# Patient Record
Sex: Male | Born: 1965 | Race: White | Hispanic: No | Marital: Married | Smoking: Former smoker
Health system: Southern US, Community
[De-identification: ages and names within clinical notes are randomized; demographics above are authoritative.]

## PROBLEM LIST (undated history)

## (undated) DIAGNOSIS — K760 Fatty (change of) liver, not elsewhere classified: Secondary | ICD-10-CM

## (undated) DIAGNOSIS — R51 Headache: Secondary | ICD-10-CM

## (undated) DIAGNOSIS — IMO0002 Reserved for concepts with insufficient information to code with codable children: Secondary | ICD-10-CM

## (undated) DIAGNOSIS — M722 Plantar fascial fibromatosis: Secondary | ICD-10-CM

## (undated) DIAGNOSIS — I10 Essential (primary) hypertension: Secondary | ICD-10-CM

## (undated) DIAGNOSIS — K8020 Calculus of gallbladder without cholecystitis without obstruction: Secondary | ICD-10-CM

## (undated) DIAGNOSIS — T8859XA Other complications of anesthesia, initial encounter: Secondary | ICD-10-CM

## (undated) DIAGNOSIS — C679 Malignant neoplasm of bladder, unspecified: Secondary | ICD-10-CM

## (undated) DIAGNOSIS — R040 Epistaxis: Secondary | ICD-10-CM

## (undated) DIAGNOSIS — G5600 Carpal tunnel syndrome, unspecified upper limb: Secondary | ICD-10-CM

## (undated) DIAGNOSIS — E781 Pure hyperglyceridemia: Secondary | ICD-10-CM

## (undated) DIAGNOSIS — R42 Dizziness and giddiness: Secondary | ICD-10-CM

## (undated) DIAGNOSIS — Z8489 Family history of other specified conditions: Secondary | ICD-10-CM

## (undated) HISTORY — DX: Calculus of gallbladder without cholecystitis without obstruction: K80.20

## (undated) HISTORY — PX: HERNIA REPAIR: SHX51

## (undated) HISTORY — PX: TONSILLECTOMY: SUR1361

## (undated) HISTORY — DX: Fatty (change of) liver, not elsewhere classified: K76.0

## (undated) HISTORY — DX: Headache: R51

## (undated) HISTORY — DX: Dizziness and giddiness: R42

## (undated) HISTORY — DX: Malignant neoplasm of bladder, unspecified: C67.9

## (undated) HISTORY — DX: Plantar fascial fibromatosis: M72.2

## (undated) HISTORY — DX: Carpal tunnel syndrome, unspecified upper limb: G56.00

## (undated) HISTORY — DX: Reserved for concepts with insufficient information to code with codable children: IMO0002

## (undated) HISTORY — PX: LAMINECTOMY: SHX219

## (undated) HISTORY — DX: Essential (primary) hypertension: I10

## (undated) HISTORY — DX: Pure hyperglyceridemia: E78.1

## (undated) HISTORY — PX: KNEE ARTHROSCOPY: SUR90

## (undated) HISTORY — DX: Epistaxis: R04.0

---

## 2005-07-11 ENCOUNTER — Ambulatory Visit: Payer: Self-pay | Admitting: Family Medicine

## 2006-01-09 ENCOUNTER — Ambulatory Visit: Payer: Self-pay | Admitting: Family Medicine

## 2006-01-23 ENCOUNTER — Ambulatory Visit: Payer: Self-pay | Admitting: Family Medicine

## 2007-09-10 ENCOUNTER — Ambulatory Visit: Payer: Self-pay | Admitting: Family Medicine

## 2007-09-10 DIAGNOSIS — R42 Dizziness and giddiness: Secondary | ICD-10-CM | POA: Insufficient documentation

## 2007-09-13 LAB — CONVERTED CEMR LAB
AST: 34 units/L (ref 0–37)
Albumin: 4.1 g/dL (ref 3.5–5.2)
Alkaline Phosphatase: 48 units/L (ref 39–117)
BUN: 11 mg/dL (ref 6–23)
Basophils Absolute: 0.3 10*3/uL — ABNORMAL HIGH (ref 0.0–0.1)
Calcium: 9.6 mg/dL (ref 8.4–10.5)
Chloride: 103 meq/L (ref 96–112)
Eosinophils Absolute: 0.2 10*3/uL (ref 0.0–0.6)
GFR calc non Af Amer: 79 mL/min
MCHC: 36 g/dL (ref 30.0–36.0)
MCV: 86.5 fL (ref 78.0–100.0)
Monocytes Relative: 5.9 % (ref 3.0–11.0)
Neutrophils Relative %: 72.6 % (ref 43.0–77.0)
Platelets: 230 10*3/uL (ref 150–400)
RBC: 4.54 M/uL (ref 4.22–5.81)
RDW: 11.9 % (ref 11.5–14.6)
Sodium: 141 meq/L (ref 135–145)
TSH: 3.98 microintl units/mL (ref 0.35–5.50)

## 2007-09-23 ENCOUNTER — Encounter: Payer: Self-pay | Admitting: Family Medicine

## 2007-10-11 ENCOUNTER — Encounter: Payer: Self-pay | Admitting: Family Medicine

## 2007-10-11 DIAGNOSIS — R030 Elevated blood-pressure reading, without diagnosis of hypertension: Secondary | ICD-10-CM | POA: Insufficient documentation

## 2007-10-11 DIAGNOSIS — G5600 Carpal tunnel syndrome, unspecified upper limb: Secondary | ICD-10-CM | POA: Insufficient documentation

## 2010-04-05 ENCOUNTER — Ambulatory Visit: Payer: Self-pay | Admitting: Family Medicine

## 2010-04-05 DIAGNOSIS — M722 Plantar fascial fibromatosis: Secondary | ICD-10-CM | POA: Insufficient documentation

## 2010-06-08 ENCOUNTER — Ambulatory Visit: Payer: Self-pay | Admitting: Family Medicine

## 2010-06-08 LAB — CONVERTED CEMR LAB
Albumin: 4.5 g/dL (ref 3.5–5.2)
Basophils Relative: 0.8 % (ref 0.0–3.0)
CO2: 30 meq/L (ref 19–32)
Chloride: 103 meq/L (ref 96–112)
Cholesterol: 224 mg/dL — ABNORMAL HIGH (ref 0–200)
Creatinine, Ser: 1 mg/dL (ref 0.4–1.5)
Eosinophils Absolute: 0.2 10*3/uL (ref 0.0–0.7)
Glucose, Bld: 95 mg/dL (ref 70–99)
Hemoglobin: 14.1 g/dL (ref 13.0–17.0)
MCHC: 34.7 g/dL (ref 30.0–36.0)
MCV: 87.9 fL (ref 78.0–100.0)
Monocytes Absolute: 0.8 10*3/uL (ref 0.1–1.0)
Neutro Abs: 4 10*3/uL (ref 1.4–7.7)
Neutrophils Relative %: 54 % (ref 43.0–77.0)
RBC: 4.62 M/uL (ref 4.22–5.81)
Total Protein: 7.6 g/dL (ref 6.0–8.3)
Triglycerides: 189 mg/dL — ABNORMAL HIGH (ref 0.0–149.0)

## 2010-06-09 ENCOUNTER — Ambulatory Visit: Payer: Self-pay | Admitting: Family Medicine

## 2010-06-09 DIAGNOSIS — E785 Hyperlipidemia, unspecified: Secondary | ICD-10-CM | POA: Insufficient documentation

## 2010-06-09 DIAGNOSIS — R519 Headache, unspecified: Secondary | ICD-10-CM | POA: Insufficient documentation

## 2010-06-09 DIAGNOSIS — R7401 Elevation of levels of liver transaminase levels: Secondary | ICD-10-CM | POA: Insufficient documentation

## 2010-06-09 DIAGNOSIS — E781 Pure hyperglyceridemia: Secondary | ICD-10-CM | POA: Insufficient documentation

## 2010-06-09 DIAGNOSIS — R51 Headache: Secondary | ICD-10-CM

## 2010-06-09 DIAGNOSIS — S82899B Other fracture of unspecified lower leg, initial encounter for open fracture type I or II: Secondary | ICD-10-CM | POA: Insufficient documentation

## 2010-06-09 DIAGNOSIS — R740 Nonspecific elevation of levels of transaminase and lactic acid dehydrogenase [LDH]: Secondary | ICD-10-CM

## 2010-06-09 DIAGNOSIS — R7402 Elevation of levels of lactic acid dehydrogenase (LDH): Secondary | ICD-10-CM | POA: Insufficient documentation

## 2010-06-09 DIAGNOSIS — R17 Unspecified jaundice: Secondary | ICD-10-CM | POA: Insufficient documentation

## 2010-06-09 HISTORY — DX: Hyperlipidemia, unspecified: E78.5

## 2010-06-09 HISTORY — DX: Other fracture of unspecified lower leg, initial encounter for open fracture type I or II: S82.899B

## 2010-06-14 ENCOUNTER — Ambulatory Visit: Payer: Self-pay | Admitting: Family Medicine

## 2010-06-14 ENCOUNTER — Encounter: Payer: Self-pay | Admitting: Family Medicine

## 2010-06-14 DIAGNOSIS — K8020 Calculus of gallbladder without cholecystitis without obstruction: Secondary | ICD-10-CM | POA: Insufficient documentation

## 2010-06-20 ENCOUNTER — Encounter: Payer: Self-pay | Admitting: Family Medicine

## 2010-06-23 ENCOUNTER — Encounter: Payer: Self-pay | Admitting: Family Medicine

## 2010-06-29 ENCOUNTER — Ambulatory Visit: Payer: Self-pay | Admitting: Family Medicine

## 2010-06-29 DIAGNOSIS — K7689 Other specified diseases of liver: Secondary | ICD-10-CM

## 2010-06-29 DIAGNOSIS — K760 Fatty (change of) liver, not elsewhere classified: Secondary | ICD-10-CM | POA: Insufficient documentation

## 2010-07-04 ENCOUNTER — Ambulatory Visit: Payer: Self-pay | Admitting: Family Medicine

## 2010-10-04 NOTE — Assessment & Plan Note (Signed)
Summary: fu ultrasound/mk   Vital Signs:  Patient profile:   45 year old male Height:      64 inches Weight:      205.25 pounds BMI:     35.36 Temp:     97.5 degrees F oral Pulse rate:   60 / minute Pulse rhythm:   regular BP sitting:   128 / 78  (left arm) Cuff size:   large  Vitals Entered By: Lewanda Rife LPN (June 29, 2010 8:37 AM) CC: f/u of ultrasound   History of Present Illness: here for f/u of abd [place] and elevated liver tests , also high chol and suspected migraine headaches  bp is better today 128/78  abd US showed gallstones and also fatty liver change  was ref to gen surg-- saw Dr Evette Cristal  no pain at all - decided to watch this due to lack of symptoms   wt is down 4 lb on his scale  is eating less fat - got the bad foods out of the house more salads and less salad dressings  no beef no chicken and fish  less calories   no new exercise yet - still dealing with foot problems  is doing a lot of stretching    migraine-- remembers had imitrex a long time ago -- but never tried  only had one since the last visit and was less severe  did not have to lie down  is working on stress reduction , does not let himself get upset -- is happier  teaching himself some good skills always above his R eye - never differs and no sinus problems  sometimes wakes up with them   Allergies: 1)  ! Aspirin 2)  ! Aspirin (Aspirin)  Past History:  Past Medical History: Last updated: 06/14/2010 carpal tunnel intermittent vertigo elevated bp  degenerative disk dz  headaches- likely migraine nosebleeds with cauterization in the past plantar fasciitis  gallstones fatty liver   Past Surgical History: Last updated: 06/14/2010 hernia repair as a teen LS Disk sx laminectomy tonsillectomy stress test/ echo 2004 abdominal US - gallstones and fatty liver  Family History: Last updated: 06/09/2010 mother -- passed , leukemia / pvd / coronary artery disease  father carotid  artery disease , CAD , kidney stones , alcoholism, DM brother - obese/ general health problems  Puncle - bladder cancer P uncle cancer -  ? 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  no alcohol  quit smoking at age 59 (was brief)  chewed tab for short amt of time-- was sporatic  no regular exercise  works at [employer] -- on feet some / some walking   Risk Factors: Smoking Status: quit (10/11/2007)  Review of Systems General:  Denies chills, fever, loss of appetite, and malaise. Eyes:  Denies blurring and eye irritation. CV:  Denies chest pain or discomfort, lightheadness, and palpitations. Resp:  Denies cough and shortness of breath. GI:  Denies abdominal pain, bloody stools, change in bowel habits, indigestion, nausea, and vomiting. GU:  Denies urinary frequency. MS:  Complains of joint pain and stiffness; denies joint redness and joint swelling. Derm:  Denies itching, lesion(s), poor wound healing, and rash. Neuro:  Complains of headaches; denies difficulty with concentration, disturbances in coordination, falling down, numbness, seizures, tingling, visual disturbances, and weakness. Psych:  Denies anxiety and depression. Endo:  Denies cold intolerance, excessive thirst, excessive urination, and heat intolerance. Heme:  Denies abnormal bruising and  bleeding.  Physical Exam  General:  overweight but generally well appearing  Head:  normocephalic, atraumatic, and no abnormalities observed.   Eyes:  vision grossly intact, pupils equal, pupils round, and pupils reactive to light.  no conjunctival pallor, injection or icterus  Mouth:  pharynx pink and moist.   Neck:  supple with full rom and no masses or thyromegally, no JVD or carotid bruit  Chest Wall:  No deformities, masses, tenderness or gynecomastia noted. Lungs:  Normal respiratory effort, chest expands symmetrically. Lungs are clear to auscultation, no crackles or  wheezes. Heart:  Normal rate and regular rhythm. S1 and S2 normal without gallop, murmur, click, rub or other extra sounds. Abdomen:  Bowel sounds positive,abdomen soft and non-tender without masses, organomegaly or hernias noted. Msk:  No deformity or scoliosis noted of thoracic or lumbar spine.  no acute joint changes  Pulses:  R and L carotid,radial,femoral,dorsalis pedis and posterior tibial pulses are full and equal bilaterally Extremities:  No clubbing, cyanosis, edema, or deformity noted with normal full range of motion of all joints.   Neurologic:  alert & oriented X3, cranial nerves II-XII intact, strength normal in all extremities, sensation intact to light touch, gait normal, DTRs symmetrical and normal, and toes down bilaterally on Babinski.   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) Assessment Unchanged pt is asymptomatic but with mild bili and alt elevation (could also come from fatty liver) will re- visit surgeon if symptomatic and eat low fat diet will re check lab in 2 mo  Problem # 2:  HYPERLIPIDEMIA (ICD-272.4) Assessment: Unchanged  working hard on diet and exp imp rev low sat fat diet in detail lab and f/u 2 mo   Labs Reviewed: SGOT: 36 (06/08/2010)   SGPT: 62 (06/08/2010)   HDL:42.70 (06/08/2010)  Chol:224 (06/08/2010)  Trig:189.0 (06/08/2010)  Problem # 3:  FATTY LIVER DISEASE (ICD-571.8) Assessment: New this could also be resp for elevated liver enzymes working hard on wt loss and low fat diet rev [place] with pt in detail re check lab in 2 mo  Problem # 4:  HEADACHE (ICD-784.0) Assessment: Improved strongly suspect migraines will try a dose of imitrex(disc poss side eff) with next one and update f/u 2 mo  disc lifestyle change- doing  well His updated medication list for this problem includes:    Mobic 15 Mg Tabs (Meloxicam) .Marland Kitchen... 1 by mouth once daily with food for foot pain as needed    Imitrex 100 Mg Tabs (Sumatriptan succinate) .Marland Kitchen... 1 by mouth times one for headache as needed max one pill per day  Complete Medication List: 1)  Mobic 15 Mg Tabs (Meloxicam) .Marland Kitchen.. 1 by mouth once daily with food for foot pain as needed 2)  Imitrex 100 Mg Tabs (Sumatriptan succinate) .Marland Kitchen.. 1 by mouth times one for headache as needed max one pill per day  Other Orders: Prescription Created Electronically [id number])  Patient Instructions: 1)  keep up with the good lifestyle change and weight loss 2)  add exercise when you can  3)  try the imitrex for headache to see if it helps  4)  we will appt with Dr Patsy Lager for foot problem when you check out  5)  schedule fasting labs in 2 months lipid/ hepatic function 272 and then follow up  Prescriptions: MOBIC 15 MG TABS (MELOXICAM) 1 by mouth once daily with food for foot pain as needed  #30 x 1   Entered and Authorized by:   Judith Part MD   Signed by:   Judith Part MD on 06/29/2010   Method used:   [facility] to        [facility] [phone]* (retail)       [address]       [place], [place]  [address]       Ph: [phone] or [phone]       Fax: [phone]   RxID:   [id number] IMITREX 100 MG TABS (SUMATRIPTAN SUCCINATE) 1 by mouth times one for headache as needed max one pill per day  #9 x 1   Entered and Authorized by:   Judith Part MD   Signed by:   Judith Part MD on 06/29/2010   Method used:   [facility] to        [facility] [phone]* (retail)       [address]       [place], [place]  [address]       Ph: [phone] or [phone]       Fax: [phone]   RxID:   [id number]    Orders Added: 1)  Prescription Created Electronically [G8553] 2)  Est. Patient Level IV  [27035]    Current Allergies (reviewed today): ! ASPIRIN ! ASPIRIN (ASPIRIN)  Prevention & Chronic Care Immunizations   Influenza vaccine: Not documented    Tetanus booster: 06/09/2010: Td    Pneumococcal vaccine: Not documented  Other Screening   Smoking status: quit  (10/11/2007)  Lipids   Total Cholesterol: 224  (06/08/2010)   LDL: Not documented   LDL Direct: 154.6  (06/08/2010)   HDL: 42.70  (06/08/2010)   Triglycerides: 189.0  (06/08/2010)    SGOT (AST): 36  (06/08/2010)   SGPT (ALT): 62  (06/08/2010)   Alkaline phosphatase: 50  (06/08/2010)   Total bilirubin: 1.9  (06/08/2010)  Self-Management Support :    Lipid self-management support: Not documented

## 2010-10-04 NOTE — Letter (Signed)
Summary: [facility]  [facility]   Imported By: Maryln Gottron 07/11/2010 13:03:29  _____________________________________________________________________  External Attachment:    Type:   Image     Comment:   External Document

## 2010-10-04 NOTE — Assessment & Plan Note (Signed)
Summary: 30 MIN APPT FOOT PROBLEM PER DR TOWER/[facility]   Vital Signs:  Patient profile:   45 year old male Height:      64 inches Weight:      205.0 pounds BMI:     35.32 Temp:     98.5 degrees F oral Pulse rate:   60 / minute Pulse rhythm:   regular BP sitting:   140 / 92  (left arm) Cuff size:   large  Vitals Entered By: Benny Lennert CMA Duncan Dull) (July 04, 2010 9:42 AM)  History of Present Illness: Chief complaint Foot problems per tower  Dr. Milinda Antis has requested a consultation regarding foot pain:  Left foot - heel at PF insertion  no injury, started to hurt a lot  took it easy for a couple of weeks  The patient presents with a 3 month long history of L heel pain. This is notable for worsening pain first thing in the morning when arising and standing after sitting.   Prior foot or ankle fractures: distant R forefoot fracture as a child Prior operations: none Orthotics or bracing: none Medications: mobic PT or home rehab: none Night splints: no Ice massage: no Ball massage: no  Metatarsal pain: no     Allergies: 1)  ! Aspirin 2)  ! Aspirin (Aspirin)  Past History:  Past medical, surgical, family and social histories (including risk factors) reviewed, and no changes noted (except as noted below).  Past Medical History: Reviewed history from 06/14/2010 and no changes required. carpal tunnel intermittent vertigo elevated bp  degenerative disk dz  headaches- likely migraine nosebleeds with cauterization in the past plantar fasciitis  gallstones fatty liver   Past Surgical History: Reviewed history from 06/14/2010 and no changes required. hernia repair as a teen LS Disk sx laminectomy tonsillectomy stress test/ echo 2004 abdominal US - gallstones and fatty liver  Family History: Reviewed history from 06/09/2010 and no changes required. mother -- passed , leukemia / pvd / coronary artery disease  father carotid artery disease , CAD , kidney stones ,  alcoholism, DM brother - obese/ general health problems  Puncle - bladder cancer P uncle cancer -  ? what kind  nephew -migraine   Social History: Reviewed history from 06/09/2010 and no changes required. is a Product manager  no alcohol  quit smoking at age 45 (was brief)  chewed tab for short amt of time-- was sporatic  no regular exercise  works at [employer] -- on feet some / some walking   Review of Systems       REVIEW OF SYSTEMS  GEN: No systemic complaints, no fevers, chills, sweats, or other acute illnesses MSK: Detailed in the HPI GI: tolerating PO intake without difficulty Neuro: No numbness, parasthesias, or tingling associated. Otherwise the pertinent positives of the ROS are noted above.    Physical Exam  General:  GEN: Well-developed,well-nourished,in no acute distress; alert,appropriate and cooperative throughout examination HEENT: Normocephalic and atraumatic without obvious abnormalities. No apparent alopecia or balding. Ears, externally no deformities PULM: Breathing comfortably in no respiratory distress EXT: No clubbing, cyanosis, or edema PSYCH: Normally interactive. Cooperative during the interview. Pleasant. Friendly and conversant. Not anxious or depressed appearing. Normal, full affect.  Msk:  Echymosis: no Edema: no ROM: full LE B Gait: heel toe, non-antalgic MT pain: no Callus pattern: none Lateral Mall: NT Medial Mall: NT Talus: NT Navicular: NT Calcaneous: NT Metatarsals: NT 5th MT: NT Phalanges: NT Achilles: NT Plantar Fascia: tender, medial along PF. Pain  with forced dorsi Fat Pad: NT Peroneals: NT Post Tib: NT Great Toe: Nml motion Ant Drawer: neg Other foot breakdown: none Long arch: preserved Transverse arch: preserved Hindfoot breakdown: none Sensation: intact  R LL approx 1 1/2 cm shorter than L   Impression & Recommendations:  Problem # 1:  PLANTAR FASCIITIS, LEFT (ICD-728.71)  Recommendations include:  We reviewed  that stretching is critically important to the treatment of PF. Reviewed footwear. Rigid soles have been shown to help with PF. Reviewed AAOFAS protocol for PF rehab Reviewed rehab of stretching and calf raises.   I would do a prolonged conservative trial minimally for 6 months before altering POC arch binders for work Placed in Tuli's heel cups for work and when up on his feet.  cc: Dr. Milinda Antis  His updated medication list for this problem includes:    Mobic 15 Mg Tabs (Meloxicam) .Marland Kitchen... 1 by mouth once daily with food for foot pain as needed  Orders: Foot Orthosis ( Arch Strap/Heel Cup) [phone])  Complete Medication List: 1)  Mobic 15 Mg Tabs (Meloxicam) .Marland Kitchen.. 1 by mouth once daily with food for foot pain as needed 2)  Imitrex 100 Mg Tabs (Sumatriptan succinate) .Marland Kitchen.. 1 by mouth times one for headache as needed max one pill per day  Patient Instructions: 1)  Please read handouts from AAOSM and American Academy of Foot and Ankle Surgeons on Plantar Fascitis. 2)  STRETCHING and Strengthening program critically important. 3)  Strengthening on foot and calf muscles as seen in handout. 4)  Calf raises, 2 legged, then 1 legged. 5)  Foot massage with tennis ball. 6)  Ice massage. 7)  NEEDS TO BE DONE EVERY DAY 8)  Recommended over the counter insoles. (Spenco or Hapad) 9)  A rigid shoe with good arch support helps: Dansko (great), Randel Pigg, Merrell 10)  No easily bendable shoes.     Orders Added: 1)  Consultation Level III [09323] 2)  Foot Orthosis ( Arch Strap/Heel Cup) [F5732]    Current Allergies (reviewed today): ! ASPIRIN ! ASPIRIN (ASPIRIN)

## 2010-10-04 NOTE — Assessment & Plan Note (Signed)
Summary: L FOOT/CLE   Vital Signs:  Patient profile:   45 year old male Height:      64 inches Weight:      202 pounds BMI:     34.80 Temp:     98.4 degrees F oral Pulse rate:   60 / minute Pulse rhythm:   regular BP sitting:   154 / 96  (left arm) Cuff size:   regular  Vitals Entered By: Lewanda Rife LPN (April 05, 2010 8:12 AM)  Serial Vital Signs/Assessments:  Time      Position  BP       Pulse  Resp  Temp     By                     130/90                         Colon Flattery Quintarius Ferns MD                     130/90                         Judith Part MD  CC: left heel pain   History of Present Illness: for 3 weeks bottom of L heel has been sore ? know why used ice on it  does a lot of walking on cement floor  did go out and buy new boots and gel packs for them no swelling - may be a little tight has never had this before   is bad after inactivity  mornings are pretty bad  Allergies: 1)  ! Aspirin 2)  ! Aspirin (Aspirin)  Past History:  Past Surgical History: Last updated: 10/11/2007 hernia repair as a teen LS Disk sx laminectomy tonsillectomy stress test/ echo 2004  Risk Factors: Smoking Status: quit (10/11/2007)  Review of Systems General:  Denies fatigue, fever, loss of appetite, and malaise. Eyes:  Denies blurring and eye irritation. CV:  Denies chest pain or discomfort, palpitations, shortness of breath with exertion, and swelling of feet. Resp:  Denies cough and wheezing. MS:  Complains of joint pain and stiffness; denies joint redness, joint swelling, cramps, and muscle weakness. Derm:  Denies itching, lesion(s), poor wound healing, and rash. Neuro:  Denies numbness, tingling, and weakness. Psych:  Complains of irritability; irritability with stress. Heme:  Denies abnormal bruising and bleeding.  Physical Exam  General:  overweight but generally well appearing  Head:  normocephalic, atraumatic, and no abnormalities observed.   Mouth:  pharynx  pink and moist.   Neck:  supple with full rom and no masses or thyromegally, no JVD or carotid bruit  Chest Wall:  No deformities, masses, tenderness or gynecomastia noted. Lungs:  Normal respiratory effort, chest expands symmetrically. Lungs are clear to auscultation, no crackles or wheezes. Heart:  Normal rate and regular rhythm. S1 and S2 normal without gallop, murmur, click, rub or other extra sounds. Msk:  L foot- tender over lateral calcaneous no swelling or skin change nl rom foot nl perf and sensation favors R foot with gait  Pulses:  R and L carotid,radial,femoral,dorsalis pedis and posterior tibial pulses are full and equal bilaterally Extremities:  No clubbing, cyanosis, edema, or deformity noted with normal full range of motion of all joints.   Neurologic:  sensation intact to light touch and DTRs symmetrical and normal.   Skin:  Intact without suspicious  lesions or rashes Cervical Nodes:  No lymphadenopathy noted Psych:  normal affect, talkative and pleasant    Impression & Recommendations:  Problem # 1:  PLANTAR FASCIITIS, LEFT (ICD-728.71) Assessment New  with heel pain- esp after inactivity handout given from aafp  ice often  hard soled shoe do not go barefoot mobic for 1 mo -see below  f/u Dr Patsy Lager in 2 weeks -- may need strapping or orthotics/ less likely injection His updated medication list for this problem includes:    Mobic 15 Mg Tabs (Meloxicam) .Marland Kitchen... 1 by mouth once daily with food for 1 month for foot pain  Orders: Prescription Created Electronically [phone])  Problem # 2:  ELEVATED BP READING WITHOUT DX HYPERTENSION (ICD-796.2)  this decreased 2nd check has fam hx  disc diet /low sodium - handout given on dash diet  f/u for PE after labs   BP today: 154/96-- recheck 130/90 Prior BP: 146/90 (09/10/2007)  Labs Reviewed: Creat: 1.1 (09/10/2007)  Instructed in low sodium diet (DASH Handout) and behavior modification.    Complete Medication  List: 1)  Mobic 15 Mg Tabs (Meloxicam) .Marland Kitchen.. 1 by mouth once daily with food for 1 month for foot pain  Patient Instructions: 1)  here is a handout on plantar fasciitis  2)  ice your heel as often as you can  3)  wear a hard soled shoe all the time -- do not go barefoot at all  4)  take mobic 15 mg daily for 1 month - take with food  5)  watch salt in diet for blood pressure  6)  eat healthy diet and get regular exercise  7)  we will schedule Dr Patsy Lager in about 2 weeks for plantar fasciitis 8)  keep bp checked at home -- if over 140 on top or 90 on the bottom I want you to follow up  9)  schedule PE in fall (labs for wellness first lipid and wellness)  Prescriptions: MOBIC 15 MG TABS (MELOXICAM) 1 by mouth once daily with food for 1 month for foot pain  #30 x 0   Entered and Authorized by:   Judith Part MD   Signed by:   Judith Part MD on 04/05/2010   Method used:   [facility] to        [facility] [phone][facility] (retail)       [address]       [place], [place]  [address]       Ph: [phone] or [phone]       Fax: [phone]   RxID:   [id number]   Prior Medications (reviewed today): None Current Allergies (reviewed today): ! ASPIRIN ! ASPIRIN (ASPIRIN)

## 2010-10-04 NOTE — Letter (Signed)
Summary: [facility] / REQUEST FOR MEDICAL RECORDS  [facility] / REQUEST FOR MEDICAL RECORDS   Imported By: Carin Primrose 06/20/2010 11:50:15  _____________________________________________________________________  External Attachment:    Type:   Image     Comment:   External Document

## 2010-10-04 NOTE — Assessment & Plan Note (Signed)
Summary: CPX / LFW   Vital Signs:  Patient profile:   45 year old male Height:      64 inches Weight:      206.50 pounds BMI:     35.57 Temp:     97.9 degrees F oral Pulse rate:   60 / minute Pulse rhythm:   regular BP sitting:   140 / 84  (left arm) Cuff size:   large  Vitals Entered By: Lewanda Rife LPN (June 09, 2010 10:50 AM)  Serial Vital Signs/Assessments:  Time      Position  BP       Pulse  Resp  Temp     By                     135/82                         Judith Part MD  CC: CPX   History of Present Illness: here for wellness exam and bp re check  first check today is 140/84-- is lower than last visit  wt is up 4 lb  thinks this is a scale problem and also wearing phone and keys   labs show high chol with LDL 154 and trig 189, HDL 42 diet is so/ so -- eats healthy most of the time -- but cheats occasionally  some chips and dip  fried foods -- about twice per month  does eat some beef , but more venison beef once per week max eats a lot of eggs  bacon once per month  occ binges of shellfish - not very often    tot bili was 1.9 and ALT was 62 does not drink alcohol at all  generally does not take tylenol occ mens multi vitamin  no longer on mobic  no liver dz or hepatitis in the past   no prostate problems no nocturia   ongong sinus problems  occ nosebleeds - has had cauterizations   occ wakes up and hasnausea and vomiting  then a sinus pressure headache keeps on  dry heaved all day sunday and then monday  usually thinks that is from post nasal drip  headache is over R eye      Allergies: 1)  ! Aspirin 2)  ! Aspirin (Aspirin)  Past History:  Past Surgical History: Last updated: 10/11/2007 hernia repair as a teen LS Disk sx laminectomy tonsillectomy stress test/ echo 2004  Family History: Last updated: 06/09/2010 mother -- passed , leukemia / pvd / coronary artery disease  father carotid artery disease , CAD , kidney  stones , alcoholism, DM brother - obese/ general health problems  Puncle - bladder cancer P uncle cancer -  ? 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  no alcohol  quit smoking at age 52 (was brief)  chewed tab for short amt of time-- was sporatic  no regular exercise  works at [employer] -- on feet some / some walking   Risk Factors: Smoking Status: quit (10/11/2007)  Past Medical History: carpal tunnel intermittent vertigo elevated bp  degenerative disk dz  headaches- likely migraine nosebleeds with cauterization in the past plantar fasciitis   Family History: mother -- passed , leukemia / pvd / coronary artery disease  father carotid artery disease , CAD , kidney stones , alcoholism, DM brother - obese/ general health problems  Puncle -  bladder cancer P uncle cancer -  ? what kind  nephew -migraine   Social History: is a Product manager  no alcohol  quit smoking at age 43 (was brief)  chewed tab for short amt of time-- was sporatic  no regular exercise  works at [employer] -- on feet some / some walking   Review of Systems General:  Denies chills, fatigue, fever, loss of appetite, and malaise. Eyes:  Denies blurring and eye irritation. ENT:  Complains of nasal congestion, nosebleeds, and postnasal drainage; denies sinus pressure and sore throat. CV:  Denies chest pain or discomfort, fatigue, and palpitations. Resp:  Denies cough and sputum productive. GI:  Complains of nausea and vomiting; denies abdominal pain, bloody stools, and change in bowel habits; with headaches . GU:  Denies dysuria, nocturia, urinary frequency, and urinary hesitancy. MS:  Denies joint pain, joint redness, and joint swelling. Derm:  Denies lesion(s), poor wound healing, and rash. Neuro:  Complains of headaches; denies poor balance, sensation of room spinning, tingling, and tremors. Psych:  Denies anxiety and depression. Endo:  Denies cold  intolerance, excessive thirst, excessive urination, and heat intolerance. Heme:  Denies abnormal bruising and bleeding.  Physical Exam  General:  overweight but generally well appearing  Head:  normocephalic, atraumatic, and no abnormalities observed.   Eyes:  vision grossly intact, pupils equal, pupils round, and pupils reactive to light.  no conjunctival pallor, injection or icterus  Ears:  R ear normal and L ear normal.   Nose:  no nasal discharge.   Mouth:  pharynx pink and moist.   Neck:  supple with full rom and no masses or thyromegally, no JVD or carotid bruit  Chest Wall:  No deformities, masses, tenderness or gynecomastia noted. Lungs:  Normal respiratory effort, chest expands symmetrically. Lungs are clear to auscultation, no crackles or wheezes. Heart:  Normal rate and regular rhythm. S1 and S2 normal without gallop, murmur, click, rub or other extra sounds. Abdomen:  Bowel sounds positive,abdomen soft and non-tender without masses, organomegaly or hernias noted. no renal bruits  Rectal:  No external abnormalities noted. Normal sphincter tone. No rectal masses or tenderness. Prostate:  Prostate gland firm and smooth, no enlargement, nodularity, tenderness, mass, asymmetry or induration. Msk:  No deformity or scoliosis noted of thoracic or lumbar spine.  no acute joint changes  Extremities:  No clubbing, cyanosis, edema, or deformity noted with normal full range of motion of all joints.   Neurologic:  cranial nerves II-XII intact, strength normal in all extremities, sensation intact to light touch, gait normal, DTRs symmetrical and normal, toes down bilaterally on Babinski, and Romberg negative.   Skin:  Intact without suspicious lesions or rashes no pallor or ictuerus  Cervical Nodes:  No lymphadenopathy noted Inguinal Nodes:  No significant adenopathy Psych:  normal affect, talkative and pleasant    Impression & Recommendations:  Problem # 1:  HEALTH MAINTENANCE EXAM  (ICD-V70.0) Assessment Comment Only reviewed health habits including diet, exercise and skin cancer prevention reviewed health maintenance list and family history  rev labs in detail plan made for better diet and exercise   Problem # 2:  TRANSAMINASES, SERUM, ELEVATED (ICD-790.4) Assessment: New strongly suspect fatty liver - disc tx and outcomes of that  check abd [place] and f/u Orders: Radiology Referral (Radiology)  Problem # 3:  HYPERBILIRUBINEMIA (ICD-782.4) Assessment: New see above check abd [place]  Orders: Radiology Referral (Radiology)  Problem # 4:  HYPERLIPIDEMIA (ICD-272.4) Assessment: New rev low sat fat diet  willplan re check of labs at f/u  pt is motivated to work on this  disc labs in detail  Problem # 5:  HEADACHE (ICD-784.0) Assessment: New strongly suspect migraine given handout from [facility] and disc lifestyle change in detail will disc tx plan in more detail at f/u nl neurol exam  His updated medication list for this problem includes:    Mobic 15 Mg Tabs (Meloxicam) .Marland Kitchen... 1 by mouth once daily with food for 1 month for foot pain  Complete Medication List: 1)  Mobic 15 Mg Tabs (Meloxicam) .Marland Kitchen.. 1 by mouth once daily with food for 1 month for foot pain  Other Orders: TD Toxoids IM 7 YR + (16109) Admin 1st Vaccine (60454)  Patient Instructions: 1)  you can raise your HDL (good cholesterol) by increasing exercise and eating omega 3 fatty acid supplement like fish oil or flax seed oil over the counter 2)  you can lower LDL (bad cholesterol) by limiting saturated fats in diet like red meat, fried foods, egg yolks, fatty breakfast meats, high fat dairy products  (cheese and ice cream ) and shellfish  3)  we will schedule abdominal ultrasound at check out 4)  work on weight loss  5)  It is important that you exercise reguarly at least 20 minutes 5 times a week. If you develop chest pain, have severe difficulty breathing, or feel very tired, stop exercising  immediately and seek medical attention.  6)  here is some information to read on cholesterol and migraine   Current Allergies (reviewed today): ! ASPIRIN ! ASPIRIN (ASPIRIN)     Immunizations Administered:  Tetanus Vaccine:    Vaccine Type: Td    Site: left deltoid    Mfr: Sanofi Pasteur    Dose: 0.5 ml    Route: IM    Given by: Lewanda Rife LPN    Exp. Date: 10/06/2011    Lot #: [id number]    VIS given: 07/22/08 version given June 09, 2010.

## 2011-03-21 ENCOUNTER — Encounter: Payer: Self-pay | Admitting: Family Medicine

## 2011-03-22 ENCOUNTER — Encounter: Payer: Self-pay | Admitting: Family Medicine

## 2011-03-22 ENCOUNTER — Ambulatory Visit (INDEPENDENT_AMBULATORY_CARE_PROVIDER_SITE_OTHER): Payer: 59 | Admitting: Family Medicine

## 2011-03-22 DIAGNOSIS — R1310 Dysphagia, unspecified: Secondary | ICD-10-CM | POA: Insufficient documentation

## 2011-03-22 DIAGNOSIS — R0683 Snoring: Secondary | ICD-10-CM | POA: Insufficient documentation

## 2011-03-22 DIAGNOSIS — R0989 Other specified symptoms and signs involving the circulatory and respiratory systems: Secondary | ICD-10-CM

## 2011-03-22 DIAGNOSIS — R7401 Elevation of levels of liver transaminase levels: Secondary | ICD-10-CM

## 2011-03-22 DIAGNOSIS — R51 Headache: Secondary | ICD-10-CM

## 2011-03-22 DIAGNOSIS — R17 Unspecified jaundice: Secondary | ICD-10-CM

## 2011-03-22 LAB — HEPATIC FUNCTION PANEL
ALT: 56 U/L — ABNORMAL HIGH (ref 0–53)
AST: 42 U/L — ABNORMAL HIGH (ref 0–37)
Albumin: 4.7 g/dL (ref 3.5–5.2)
Alkaline Phosphatase: 48 U/L (ref 39–117)
Total Protein: 7.8 g/dL (ref 6.0–8.3)

## 2011-03-22 MED ORDER — PROMETHAZINE HCL 25 MG PO TABS: 25.0000 mg | ORAL_TABLET | Freq: Three times a day (TID) | ORAL | Status: AC | PRN

## 2011-03-22 MED ORDER — SUMATRIPTAN SUCCINATE 100 MG PO TABS: 100.0000 mg | ORAL_TABLET | Freq: Every day | ORAL | Status: DC | PRN

## 2011-03-22 NOTE — Assessment & Plan Note (Signed)
Nl exam and many features of migraine  Waking up with them Ref to pulm to eval for sleep apnea -also loud snoring and fatigue  Then re eval Adv to take imitrex early in ha  Phenergan for nausea prn

## 2011-03-22 NOTE — Patient Instructions (Signed)
We will do pulmonary referral at check out to evaluate for possible sleep apnea  Labs today for liver function  Take imitrex at the first sign of a headache  Use phenergan for nausea as needed - this can make you sleepy so use caution  After your sleep issues are worked up we will discuss the swallowing / vomiting in more detail  Follow up with me 6 weeks after pulmonary visit

## 2011-03-22 NOTE — Assessment & Plan Note (Signed)
?   If poss stricture Disc further at f/u- may need GI ref

## 2011-03-22 NOTE — Assessment & Plan Note (Signed)
With his ha pt has been vomiting yellow material  Will check lfts

## 2011-03-22 NOTE — Progress Notes (Signed)
Subjective:    Patient ID: Albert Tran, male    DOB: [birth year], 45 y.o.   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 )   [employer] to bed ok  Wakes up in the am - with a headache  Takes a hot shower- sometimes helps Goes to work and it either gets better or worse by noon   Since he does not know which ones are going to get bad - does not know when to take them  Last few - waited too long - and was too late for it to help   Needs to start keeping a log  Now having more often in the past month  Seems like it is changing - in past would vomit and headache would get better  No longer, however   Headaches start with general feeling of malaise - then pain over one eye and throbbing  Other headaches are overall pain   Other meds  Alcohol-- none  caff- none at all , drinks water  Sleep habits - out of whack - goes to bed 11pm and gets up 5-6 am , during the week wakes up fatigued  He does snore , and no witnessed apnea  Mood -- anxious about leaving wife home alone  Stress- got worse lately - wife has MS and this is getting much worse - worry and taking care of her  She is not doing the exercises she needs to do and he is pushing her  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 HYPERLIPIDEMIA  . CARPAL TUNNEL SYNDROME  . FATTY LIVER DISEASE  . CHOLELITHIASIS  . PLANTAR FASCIITIS, LEFT  . VERTIGO  . HYPERBILIRUBINEMIA  . HEADACHE  . TRANSAMINASES, SERUM, ELEVATED  . ELEVATED BP READING WITHOUT DX HYPERTENSION  . Snoring  . Dysphagia   Past Medical History  Diagnosis Date  . Carpal tunnel syndrome   . Vertigo,  intermittent   . Elevated BP   . DDD (degenerative disc disease)   . Headache     likely migraines  . Nosebleed     Hx of  . Plantar fasciitis   . Gallstones   . Fatty liver    Past Surgical History  Procedure Date  . Hernia repair     as a teen  . Laminectomy     LS disc sx  . Tonsillectomy    History  Substance Use Topics  . Smoking status: Former Smoker    Quit date: 09/05/1987  . Smokeless tobacco: Former Neurosurgeon    Types: Chew  . Alcohol Use: No   Family History  Problem Relation Age of Onset  . Heart disease Mother     CAD  . Alcohol abuse Father   . Diabetes Father   . Heart disease Father     CAD  . Nephrolithiasis Father   . Obesity Brother   . Cancer Paternal Uncle     bladder CA   Allergies  Allergen Reactions  . Aspirin     REACTION: as a child   Current  Outpatient Prescriptions on File Prior to Visit  Medication Sig Dispense Refill  . meloxicam (MOBIC) 15 MG tablet Take 15 mg by mouth daily as needed.                Review of Systems Review of Systems  Constitutional: Negative for fever, appetite change, fatigue and unexpected weight change.  Eyes: Negative for pain and visual disturbance.  Respiratory: Negative for cough and shortness of breath.   Cardiovascular: Negative. For cp or sob or palpitations   Gastrointestinal: Negative for diarrhea and constipation. pos for vomiting with headaches Genitourinary: Negative for urgency and frequency.  Skin: Negative for pallor. Or rash  Neurological: Negative for weakness, light-headedness, numbness and pos for  headaches.  Hematological: Negative for adenopathy. Does not bruise/bleed easily.  Psychiatric/Behavioral: Negative for dysphoric mood. The patient is not nervous/anxious.  - does admit to being stressed         Objective:   Physical Exam  Constitutional: He appears well-developed and well-nourished. No distress.       overwt and well appearing   HENT:  Head: Normocephalic and  atraumatic.  Mouth/Throat: Oropharynx is clear and moist.  Eyes: Conjunctivae and EOM are normal. Pupils are equal, round, and reactive to light.  Neck: Normal range of motion. Neck supple. No JVD present. No thyromegaly present.  Cardiovascular: Normal rate, regular rhythm, normal heart sounds and intact distal pulses.   Pulmonary/Chest: Effort normal and breath sounds normal. No respiratory distress. He has no wheezes.  Abdominal: Soft. Bowel sounds are normal. He exhibits no distension and no mass. There is no hepatosplenomegaly. There is no tenderness.  Musculoskeletal: Normal range of motion. He exhibits no edema and no tenderness.  Lymphadenopathy:    He has no cervical adenopathy.  Neurological: He is alert. He has normal strength and normal reflexes. He displays normal reflexes. No cranial nerve deficit or sensory deficit. He exhibits normal muscle tone. He displays a negative Romberg sign. Coordination and gait normal.  Skin: Skin is warm and dry. No rash noted. No erythema. No pallor.  Psychiatric: He has a normal mood and affect.          Assessment & Plan:

## 2011-11-27 ENCOUNTER — Ambulatory Visit (INDEPENDENT_AMBULATORY_CARE_PROVIDER_SITE_OTHER): Payer: 59 | Admitting: Family Medicine

## 2011-11-27 ENCOUNTER — Encounter: Payer: Self-pay | Admitting: Family Medicine

## 2011-11-27 VITALS — BP 140/72 | HR 73 | Temp 98.2°F | Ht 65.0 in | Wt 211.3 lb

## 2011-11-27 DIAGNOSIS — J209 Acute bronchitis, unspecified: Secondary | ICD-10-CM

## 2011-11-27 MED ORDER — AZITHROMYCIN 250 MG PO TABS: ORAL_TABLET | ORAL | Status: AC

## 2011-11-27 MED ORDER — GUAIFENESIN-CODEINE 100-10 MG/5ML PO SYRP: 5.0000 mL | ORAL_SOLUTION | Freq: Four times a day (QID) | ORAL | Status: AC | PRN

## 2011-11-27 NOTE — Assessment & Plan Note (Signed)
Without reactive airways Worsening prod cough Reassuring exam Cover with zpak Also robutissin / cod for cough with caution Disc symptomatic care - see instructions on AVS  Update if not starting to improve in a week or if worsening

## 2011-11-27 NOTE — Progress Notes (Signed)
Subjective:    Patient ID: Albert Tran, male    DOB: 14-Jul-1966, 46 y.o.   MRN: [id number]  HPI Started his symptoms - about a week ago  Cough is a major symptom- spits out dark yellow to green sputum  Sinuses are hurting a bit- pressure Some drainage Not sleeping well with cough  No fever  No aches or chills   Ears ring a bit- no pain No n/v/d   Using halls cough drops   Does not regularly get flu shot  Patient Active Problem List  Diagnoses  . HYPERLIPIDEMIA  . CARPAL TUNNEL SYNDROME  . FATTY LIVER DISEASE  . CHOLELITHIASIS  . PLANTAR FASCIITIS, LEFT  . VERTIGO  . HYPERBILIRUBINEMIA  . HEADACHE  . TRANSAMINASES, SERUM, ELEVATED  . ELEVATED BP READING WITHOUT DX HYPERTENSION  . Snoring  . Dysphagia  . Bronchitis, acute   Past Medical History  Diagnosis Date  . Carpal tunnel syndrome   . Vertigo, intermittent   . Elevated BP   . DDD (degenerative disc disease)   . Headache     likely migraines  . Nosebleed     Hx of  . Plantar fasciitis   . Gallstones   . Fatty liver    Past Surgical History  Procedure Date  . Hernia repair     as a teen  . Laminectomy     LS disc sx  . Tonsillectomy    History  Substance Use Topics  . Smoking status: Former Smoker    Quit date: 09/05/1987  . Smokeless tobacco: Former Neurosurgeon    Types: Chew  . Alcohol Use: No   Family History  Problem Relation Age of Onset  . Heart disease Mother     CAD  . Alcohol abuse Father   . Diabetes Father   . Heart disease Father     CAD  . Nephrolithiasis Father   . Obesity Brother   . Cancer Paternal Uncle     bladder CA   Allergies  Allergen Reactions  . Aspirin     REACTION: as a child   Current Outpatient Prescriptions on File Prior to Visit  Medication Sig Dispense Refill  . SUMAtriptan (IMITREX) 100 MG tablet Take 1 tablet (100 mg total) by mouth daily as needed for migraine. 1 tablet by mouth times one for headache as needed max one pill per day.  10 tablet  11      Review of Systems Review of Systems  Constitutional: Negative for fever, appetite change, fatigue and unexpected weight change.  Eyes: Negative for pain and visual disturbance.  Respiratory: Negative for cough and shortness of breath.   Cardiovascular: Negative for cp or palpitations    Gastrointestinal: Negative for nausea, diarrhea and constipation.  Genitourinary: Negative for urgency and frequency.  Skin: Negative for pallor or rash   Neurological: Negative for weakness, light-headedness, numbness and headaches.  Hematological: Negative for adenopathy. Does not bruise/bleed easily.  Psychiatric/Behavioral: Negative for dysphoric mood. The patient is not nervous/anxious.          Objective:   Physical Exam  Constitutional: He appears well-developed and well-nourished. No distress.       overwt and well appearing   HENT:  Head: Normocephalic and atraumatic.  Right Ear: External ear normal.  Left Ear: External ear normal.  Mouth/Throat: Oropharynx is clear and moist.       Nares are injected and congested  No sinus tenderness  Throat- scant post nasal drip   Eyes:  Conjunctivae and EOM are normal. Pupils are equal, round, and reactive to light. Right eye exhibits no discharge. Left eye exhibits no discharge.  Neck: Normal range of motion. Neck supple.  Cardiovascular: Normal rate, regular rhythm and normal heart sounds.  Exam reveals no gallop.   Pulmonary/Chest: Effort normal and breath sounds normal. No respiratory distress. He has no wheezes. He has no rales. He exhibits no tenderness.       Generally harsh bs  Scant rhonchi throughout Not sob   Musculoskeletal: He exhibits no edema.  Lymphadenopathy:    He has no cervical adenopathy.  Neurological: He is alert.  Skin: Skin is warm and dry. No rash noted.  Psychiatric: He has a normal mood and affect.          Assessment & Plan:

## 2011-11-27 NOTE — Patient Instructions (Signed)
Drink lots of fluids and get rest  If you start wheezing or short of breath update me  Take the zithromax for bronchitis  Cough medicine - use with caution because it can make you sleepy Update if not starting to improve in a week or if worsening  (this may last 2-3 more weeks)

## 2012-06-19 ENCOUNTER — Encounter: Payer: Self-pay | Admitting: Family Medicine

## 2012-06-19 ENCOUNTER — Ambulatory Visit (INDEPENDENT_AMBULATORY_CARE_PROVIDER_SITE_OTHER): Payer: 59 | Admitting: Family Medicine

## 2012-06-19 ENCOUNTER — Ambulatory Visit (INDEPENDENT_AMBULATORY_CARE_PROVIDER_SITE_OTHER)
Admission: RE | Admit: 2012-06-19 | Discharge: 2012-06-19 | Disposition: A | Discharge status: Home / Self Care | Payer: 59 | Source: Ambulatory Visit | Attending: Family Medicine | Admitting: Family Medicine

## 2012-06-19 VITALS — BP 152/94 | HR 62 | Temp 98.2°F | Ht 64.0 in | Wt 208.2 lb

## 2012-06-19 DIAGNOSIS — R071 Chest pain on breathing: Secondary | ICD-10-CM

## 2012-06-19 DIAGNOSIS — R0789 Other chest pain: Secondary | ICD-10-CM | POA: Insufficient documentation

## 2012-06-19 MED ORDER — MELOXICAM 15 MG PO TABS: 15.0000 mg | ORAL_TABLET | Freq: Every day | ORAL | Status: DC | PRN

## 2012-06-19 NOTE — Patient Instructions (Addendum)
xrays today  We will call you with results Use gentle heat on your side  Try the mobic - I sent to your pharmacy  Take it with food

## 2012-06-19 NOTE — Progress Notes (Signed)
Subjective:    Patient ID: Albert Tran, male    DOB: 04-03-1966, 46 y.o.   MRN: [id number]  HPI  Here for chest wall/ rib pain   This started last week at work - about a week ago  Stood up to walk- a twinge in R ribs upper  Got better Then yesterday- stood up - really bad pain in R upper ribs  Twisting and bending is very painful -- especially getting in the car  A bit sore to press on No trauma   Has not been coughing  No sob  No n/v  Does hurt to take a deep breath  Is a prior smoker   Patient Active Problem List  Diagnosis  . HYPERLIPIDEMIA  . CARPAL TUNNEL SYNDROME  . FATTY LIVER DISEASE  . CHOLELITHIASIS  . PLANTAR FASCIITIS, LEFT  . VERTIGO  . HYPERBILIRUBINEMIA  . HEADACHE  . TRANSAMINASES, SERUM, ELEVATED  . ELEVATED BP READING WITHOUT DX HYPERTENSION  . Snoring  . Dysphagia  . Bronchitis, acute   Past Medical History  Diagnosis Date  . Carpal tunnel syndrome   . Vertigo, intermittent   . Elevated BP   . DDD (degenerative disc disease)   . Headache     likely migraines  . Nosebleed     Hx of  . Plantar fasciitis   . Gallstones   . Fatty liver    Past Surgical History  Procedure Date  . Hernia repair     as a teen  . Laminectomy     LS disc sx  . Tonsillectomy    History  Substance Use Topics  . Smoking status: Former Smoker    Quit date: 09/05/1987  . Smokeless tobacco: Former Neurosurgeon    Types: Chew  . Alcohol Use: No   Family History  Problem Relation Age of Onset  . Heart disease Mother     CAD  . Alcohol abuse Father   . Diabetes Father   . Heart disease Father     CAD  . Nephrolithiasis Father   . Obesity Brother   . Cancer Paternal Uncle     bladder CA   Allergies  Allergen Reactions  . Aspirin     REACTION: as a child   Current Outpatient Prescriptions on File Prior to Visit  Medication Sig Dispense Refill  . SUMAtriptan (IMITREX) 100 MG tablet Take 1 tablet (100 mg total) by mouth daily as needed for migraine. 1  tablet by mouth times one for headache as needed max one pill per day.  10 tablet  11     Review of Systems Review of Systems  Constitutional: Negative for fever, appetite change, fatigue and unexpected weight change.  Eyes: Negative for pain and visual disturbance.  Respiratory: Negative for cough and shortness of breath.   Cardiovascular: Negative for palpitations/ diaphoresis/ sob Gastrointestinal: Negative for nausea, diarrhea and constipation.  Genitourinary: Negative for urgency and frequency.  Skin: Negative for pallor or rash   Neurological: Negative for weakness, light-headedness, numbness and headaches.  Hematological: Negative for adenopathy. Does not bruise/bleed easily.  Psychiatric/Behavioral: Negative for dysphoric mood. The patient is not nervous/anxious.         Objective:   Physical Exam  Constitutional: He appears well-developed and well-nourished. No distress.  HENT:  Head: Normocephalic and atraumatic.  Mouth/Throat: Oropharynx is clear and moist.  Eyes: Conjunctivae normal and EOM are normal. Pupils are equal, round, and reactive to light. No scleral icterus.  Neck: Normal range of  motion. Neck supple. No JVD present. No thyromegaly present.  Cardiovascular: Normal rate, regular rhythm and normal heart sounds.   Pulmonary/Chest: Effort normal and breath sounds normal. No respiratory distress. He has no wheezes. He has no rales. He exhibits tenderness.       Right sided CW tenderness laterally without crepitus or skin change  bs full throughout   Abdominal: Soft. Bowel sounds are normal.  Musculoskeletal: He exhibits no edema.       No leg tenderness or edema and no palpable cords  Lymphadenopathy:    He has no cervical adenopathy.  Neurological: He is alert.  Skin: Skin is warm and dry. No rash noted. No erythema.  Psychiatric: He has a normal mood and affect.          Assessment & Plan:

## 2012-06-23 NOTE — Assessment & Plan Note (Signed)
Likely costochondritis based on exam No hx of trauma cxr and rib films nl  mobic trial with food and warm compresses as needed  Update if not starting to improve in a week or if worsening   Disc risk for atelectasis and urged pt to take deep breaths to prevent this

## 2013-06-18 ENCOUNTER — Encounter: Payer: Self-pay | Admitting: Family Medicine

## 2013-06-18 ENCOUNTER — Ambulatory Visit (INDEPENDENT_AMBULATORY_CARE_PROVIDER_SITE_OTHER): Payer: Commercial Managed Care - PPO | Admitting: Family Medicine

## 2013-06-18 VITALS — BP 148/90 | HR 72 | Temp 98.2°F | Wt 202.0 lb

## 2013-06-18 DIAGNOSIS — J339 Nasal polyp, unspecified: Secondary | ICD-10-CM

## 2013-06-18 DIAGNOSIS — R519 Headache, unspecified: Secondary | ICD-10-CM | POA: Insufficient documentation

## 2013-06-18 DIAGNOSIS — R51 Headache: Secondary | ICD-10-CM

## 2013-06-18 MED ORDER — FLUTICASONE PROPIONATE 50 MCG/ACT NA SUSP: 2.0000 | Freq: Every day | NASAL | Status: DC

## 2013-06-18 NOTE — Progress Notes (Signed)
  Subjective:    Patient ID: Albert Tran, male    DOB: [birth year], 47 y.o.   MRN: [id number]  HPI CC: sinusitis?  Prone to pressure headaches over last 14+ years, told migraines.  Associated with vomiting then feels better. Tried imitrex in past which hasn't really helped.  Tried neti pot as well.  Has had eval for this in past including MRI, CT - no records available.  Will pull old chart.  Pressure over R eye started last week associated with nausea, now spreading to left, drainage down throat, mild nasal congestion.  Ringing in ears with congestion.  Some diaphoresis.  No abd pain, cough, vomiting, rashes,ear or tooth pain, sore throat.  No wheeze or dyspnea.  No nasal drainage. So far has tried nothing.  No sick contacts at home.  Possible contacts at work. Nephew smokes - outside.  No h/o asthma, COPD.  bp elevated today - states at home runs well, checks regularly.  Advised to continue checking and notify us if persistently elevated.  Past Medical History  Diagnosis Date  . Carpal tunnel syndrome   . Vertigo, intermittent   . Elevated BP   . DDD (degenerative disc disease)   . Headache(784.0)     likely migraines  . Nosebleed     Hx of  . Plantar fasciitis   . Gallstones   . Fatty liver     Review of Systems Per HPI    Objective:   Physical Exam  Nursing note and vitals reviewed. Constitutional: He appears well-developed and well-nourished. No distress.  HENT:  Head: Normocephalic and atraumatic.  Right Ear: Hearing, tympanic membrane, external ear and ear canal normal.  Left Ear: Hearing, tympanic membrane, external ear and ear canal normal.  Nose: Mucosal edema present. Right sinus exhibits no maxillary sinus tenderness and no frontal sinus tenderness. Left sinus exhibits no maxillary sinus tenderness and no frontal sinus tenderness.  Mouth/Throat: Uvula is midline and mucous membranes are normal. Posterior oropharyngeal erythema present. No oropharyngeal  exudate, posterior oropharyngeal edema or tonsillar abscesses.  Polyp tissue appreciated R lateral nare  Eyes: Conjunctivae and EOM are normal. Pupils are equal, round, and reactive to light. No scleral icterus.  Neck: Normal range of motion. Neck supple.  Cardiovascular: Normal rate, regular rhythm, normal heart sounds and intact distal pulses.   No murmur heard. Pulmonary/Chest: Effort normal and breath sounds normal. No respiratory distress. He has no wheezes. He has no rales.  Musculoskeletal: He exhibits no edema.  Lymphadenopathy:    He has no cervical adenopathy.  Skin: Skin is warm and dry.  Psychiatric: He has a normal mood and affect.       Assessment & Plan:

## 2013-06-18 NOTE — Assessment & Plan Note (Signed)
<  1wk h/o sinus pressure headache along with sinus congestion sxs, with what looks like a nasal polyp on right. Start flonase.  No evidence of infection today. Given ongoing issue will refer to ENT for further evaluation and opinion if nasal polyp could be accounting for R pressure headaches he's been having. Pt agrees with plan.  Reviewed records - CT scan of paranasal sinuses from 01/2006 WNL with normal nasoturbinates and midline nasal septum.

## 2013-06-18 NOTE — Patient Instructions (Signed)
I think you may have polyp on right side - I will refer you to ENT for further evaluation. For sinus pressure headache - start nasal steroid daily.  Also use nasal saline irrigation for congestion. Push fluids and plenty of rest.

## 2013-07-07 ENCOUNTER — Ambulatory Visit: Payer: Self-pay | Admitting: Otolaryngology

## 2013-07-09 ENCOUNTER — Emergency Department: Payer: Self-pay | Admitting: Emergency Medicine

## 2013-07-09 LAB — CK: CK, Total: 208 U/L (ref 35–232)

## 2013-07-09 LAB — COMPREHENSIVE METABOLIC PANEL
Albumin: 3.7 g/dL (ref 3.4–5.0)
Alkaline Phosphatase: 65 U/L (ref 50–136)
Anion Gap: 4 — ABNORMAL LOW (ref 7–16)
Calcium, Total: 8.6 mg/dL (ref 8.5–10.1)
Chloride: 111 mmol/L — ABNORMAL HIGH (ref 98–107)
Creatinine: 1.17 mg/dL (ref 0.60–1.30)
EGFR (African American): >60
Glucose: 180 mg/dL — ABNORMAL HIGH (ref 65–99)
Potassium: 3.9 mmol/L (ref 3.5–5.1)
SGOT(AST): 32 U/L (ref 15–37)
Sodium: 141 mmol/L (ref 136–145)

## 2013-07-09 LAB — CBC
HCT: 36.5 % — ABNORMAL LOW (ref 40.0–52.0)
MCH: 29.8 pg (ref 26.0–34.0)
MCHC: 34.5 g/dL (ref 32.0–36.0)
MCV: 86 fL (ref 80–100)
RBC: 4.23 10*6/uL — ABNORMAL LOW (ref 4.40–5.90)
WBC: 13.4 10*3/uL — ABNORMAL HIGH (ref 3.8–10.6)

## 2013-07-10 ENCOUNTER — Encounter (HOSPITAL_COMMUNITY): Payer: Self-pay | Admitting: Emergency Medicine

## 2013-07-10 ENCOUNTER — Emergency Department (HOSPITAL_COMMUNITY)
Admission: EM | Admit: 2013-07-10 | Discharge: 2013-07-10 | Disposition: A | Discharge status: Home / Self Care | Payer: Commercial Managed Care - PPO | Attending: Emergency Medicine | Admitting: Emergency Medicine

## 2013-07-10 DIAGNOSIS — Y9389 Activity, other specified: Secondary | ICD-10-CM | POA: Insufficient documentation

## 2013-07-10 DIAGNOSIS — S838X9A Sprain of other specified parts of unspecified knee, initial encounter: Secondary | ICD-10-CM | POA: Insufficient documentation

## 2013-07-10 DIAGNOSIS — X500XXA Overexertion from strenuous movement or load, initial encounter: Secondary | ICD-10-CM | POA: Insufficient documentation

## 2013-07-10 DIAGNOSIS — S86111A Strain of other muscle(s) and tendon(s) of posterior muscle group at lower leg level, right leg, initial encounter: Secondary | ICD-10-CM

## 2013-07-10 DIAGNOSIS — Z8669 Personal history of other diseases of the nervous system and sense organs: Secondary | ICD-10-CM | POA: Insufficient documentation

## 2013-07-10 DIAGNOSIS — Z87891 Personal history of nicotine dependence: Secondary | ICD-10-CM | POA: Insufficient documentation

## 2013-07-10 DIAGNOSIS — Z8719 Personal history of other diseases of the digestive system: Secondary | ICD-10-CM | POA: Insufficient documentation

## 2013-07-10 DIAGNOSIS — Y929 Unspecified place or not applicable: Secondary | ICD-10-CM | POA: Insufficient documentation

## 2013-07-10 DIAGNOSIS — Z8739 Personal history of other diseases of the musculoskeletal system and connective tissue: Secondary | ICD-10-CM | POA: Insufficient documentation

## 2013-07-10 MED ORDER — MORPHINE SULFATE 4 MG/ML IJ SOLN
4.0000 mg | INTRAMUSCULAR | Status: DC | PRN
  Administered 2013-07-10: 4 mg via INTRAVENOUS
  Filled 2013-07-10: qty 1

## 2013-07-10 NOTE — Progress Notes (Signed)
Patient ID: Albert Tran, male   DOB: 1966-08-26, 47 y.o.   MRN: [id number] [name]'s clinical exam continues to improve.  Right leg still swollen, but easily palpable pulses and normal motor/synsory exam.  He is now 12 hours out from his injury.  i am comfortable letting him go home with close follow-up.  He will call me today if his pain worsens or he develops severe numbness in his foot.  He will still elevate and ice thru out the day today.

## 2013-07-10 NOTE — Consult Note (Signed)
Reason for Consult:  Rule-out compartment syndrome right leg Referring Physician:   ED MD Hyacinth Meeker)  Albert Tran is an 47 y.o. male.  HPI:   47 yo male from the [place] area who is now 7 hours out from his original injury.  At 6:30 pm last evening, he was trying to corral his horse when he felt an acute pop in his right calf with extreme pain.  Likely a gastroc muscle tear the way he describes his injury.  He went to [facility] soon after and it was felt that he may have compartment syndrome in his right leg and he was transferred to [facility] for further eval and treatment.  He reports that his foot was quite numb when he was in [place], but now the numbness has gone away.  It took quite some time to transfer him here.  He was evaluated by the ED staff and Ortho was appropriately consulted to rule out compartment syndrome.  Past Medical History  Diagnosis Date  . Carpal tunnel syndrome   . Vertigo, intermittent   . Elevated BP   . DDD (degenerative disc disease)   . Headache(784.0)     likely migraines  . Nosebleed     Hx of  . Plantar fasciitis   . Gallstones   . Fatty liver     Past Surgical History  Procedure Laterality Date  . Hernia repair      as a teen  . Laminectomy      LS disc sx  . Tonsillectomy      Family History  Problem Relation Age of Onset  . Heart disease Mother     CAD  . Alcohol abuse Father   . Diabetes Father   . Heart disease Father     CAD  . Nephrolithiasis Father   . Obesity Brother   . Cancer Paternal Uncle     bladder CA    Social History:  reports that he quit smoking about 25 years ago. He has quit using smokeless tobacco. His smokeless tobacco use included Chew. He reports that he does not drink alcohol or use illicit drugs.  Allergies: No Known Allergies  Medications: I have reviewed the patient's current medications.  No results found for this or any previous visit (from the past 48 hour(s)).  No results  found.  Review of Systems  All other systems reviewed and are negative.   Blood pressure 119/75, pulse 72, temperature 98.7 F (37.1 C), temperature source Oral, resp. rate 18, height 5\' 5"  (1.651 m), weight 88.451 kg (195 lb), SpO2 97.00%. Physical Exam  Musculoskeletal:       Right lower leg: He exhibits swelling.   He appears quite comfortable. His posterior compartment and lateral compartment of his right leg ar quite tight, but do get a little softer with elevation and knee flexion. When comparing pulses of his right and left feet/ankles, his PT and DP pulses are easily palpable and equal.  His sensation is entirely normal over his right foot, there is no pallor, and he can fully dorsiflex his ankle/foot/toes easily with minimal discomfort. Again, his vitals are stable and he appears very relaxed and comfortable.  Assessment/Plan: Right leg with acute gastroc muscle tear and full compartments. 1)  He is now 7 hours out from his injury with now normal pulses, normal sensation, much less pain, and no motor deficits in his foot at all.  Based on clinical exam and clinical suspicion, he now does not  appear to have active compartment syndrome, especially with a clinical exam that is improving.  He reports feeling much better and denies any foot numbness.  He appears very comfortable. Right now, I do not feel that he needs to be rushed to the OR for emergent compartment releases based on these findings.  I would like to keep him in the ER for the next few hours with strict elevation and ice to see if his clinical exam continues to improve.  I will stop by in a few hours for a repeat exam and if he continues to improve, I am comfortable with letting him go home.  If he seems to be worsening in any way, I will proceed to the OR.  Tashiba Timoney Y 07/10/2013, 1:48 AM

## 2013-07-10 NOTE — ED Notes (Signed)
Ortho paged. 

## 2013-07-10 NOTE — ED Provider Notes (Signed)
CSN: [id number]     Arrival date & time 07/10/13  0033 History   None    Chief Complaint  Patient presents with  . Leg Pain   (Consider location/radiation/quality/duration/timing/severity/associated sxs/prior Treatment) HPI Comments: Pt is otherwise healthy - he was chasing a horse and felt acute pop in the back of the R calf -- this was 6 hours prior to arrival - he initially went to OSH and orthopedic consultation was requested to evaluate for compartment syndrome but unavailable [facility] / ortho.  Pt has had ongoing pain and swelling in the RLE - worse with movement of of the foot and knee - associated numbness and pins and needles sensation has improved over last hour but still present.  He received pain medicine at OSH prior to transport.  Sx are severe.  Not on blood thinners.  Patient is a 47 y.o. male presenting with leg pain. The history is provided by the patient.  Leg Pain   Past Medical History  Diagnosis Date  . Carpal tunnel syndrome   . Vertigo, intermittent   . Elevated BP   . DDD (degenerative disc disease)   . Headache(784.0)     likely migraines  . Nosebleed     Hx of  . Plantar fasciitis   . Gallstones   . Fatty liver    Past Surgical History  Procedure Laterality Date  . Hernia repair      as a teen  . Laminectomy      LS disc sx  . Tonsillectomy     Family History  Problem Relation Age of Onset  . Heart disease Mother     CAD  . Alcohol abuse Father   . Diabetes Father   . Heart disease Father     CAD  . Nephrolithiasis Father   . Obesity Brother   . Cancer Paternal Uncle     bladder CA   History  Substance Use Topics  . Smoking status: Former Smoker    Quit date: 09/05/1987  . Smokeless tobacco: Former Neurosurgeon    Types: Chew  . Alcohol Use: No    Review of Systems  All other systems reviewed and are negative.    Allergies  Review of patient's allergies indicates no known allergies.  Home Medications   Current Outpatient Rx  Name   Route  Sig  Dispense  Refill  . aspirin-acetaminophen-caffeine (EXCEDRIN MIGRAINE) 250-250-65 MG per tablet   Oral   Take 2 tablets by mouth every 6 (six) hours as needed for headache.         . predniSONE (STERAPRED UNI-PAK) 10 MG tablet   Oral   Take 1-2 tablets by mouth See admin instructions. Take as directed          BP 152/86  Pulse 75  Temp(Src) 98.7 F (37.1 C) (Oral)  Resp 18  Ht 5\' 5"  (1.651 m)  Wt 195 lb (88.451 kg)  BMI 32.45 kg/m2  SpO2 96% Physical Exam  Nursing note and vitals reviewed. Constitutional: He appears well-developed and well-nourished. No distress.  HENT:  Head: Normocephalic and atraumatic.  Mouth/Throat: Oropharynx is clear and moist. No oropharyngeal exudate.  Eyes: Conjunctivae and EOM are normal. Pupils are equal, round, and reactive to light. Right eye exhibits no discharge. Left eye exhibits no discharge. No scleral icterus.  Neck: Normal range of motion. Neck supple. No JVD present. No thyromegaly present.  Cardiovascular: Normal rate, regular rhythm, normal heart sounds and intact distal pulses.  Exam reveals  no gallop and no friction rub.   No murmur heard. Pulmonary/Chest: Effort normal and breath sounds normal. No respiratory distress. He has no wheezes. He has no rales.  Abdominal: Soft. Bowel sounds are normal. He exhibits no distension and no mass. There is no tenderness.  Musculoskeletal: He exhibits tenderness. He exhibits no edema.  Left lower extremity has a very tight and tender lower extremity below the knee and above the ankle. There is significant tenderness with palpation, there is pain with passive range of motion of the ankle, there are palpable pulses at the right foot and sensation is slightly decreased on the right compared to the left. At the foot.  Lymphadenopathy:    He has no cervical adenopathy.  Neurological: He is alert. Coordination normal.  Skin: Skin is warm and dry. No rash noted. No erythema.  Psychiatric: He  has a normal mood and affect. His behavior is normal.    ED Course  Procedures (including critical care time) Labs Review Labs Reviewed - No data to display Imaging Review No results found.  EKG Interpretation   None       MDM   1. Gastrocnemius muscle tear, right, initial encounter    There is concern for compartment syndrome, orthopedic surgery consult at immediately on the patient's arrival to the emergency department. I have reviewed the outside hospitals testing including imaging which showed is a fracture of the leg and normal blood work except for a leukocytosis.    Discussed with Dr. Magnus Ivan of orthopedic surgery who will see the patient in the emergency department.  Dr. Magnus Ivan has seen the patient again this morning and feels that there is no indication for a fasciotomy, the patient has had a slight improvement, there is no neurologic deficits, he has been given pain medication by Dr. Magnus Ivan, prescription for Percocet, followup in the office with him as well.    Vida Roller, MD 07/10/13 [phone]

## 2013-07-10 NOTE — ED Notes (Signed)
Pt given d/c instructions and verbalized understanding. NAD at this time.  

## 2013-07-10 NOTE — ED Notes (Signed)
Patient was chasing a horse, and felt a pop in the right proximal calf. Patient was seen at [facility], and sent here for evaluation for possible compartment syndrome.

## 2015-08-09 ENCOUNTER — Encounter: Payer: Self-pay | Admitting: Family Medicine

## 2015-08-09 ENCOUNTER — Ambulatory Visit (INDEPENDENT_AMBULATORY_CARE_PROVIDER_SITE_OTHER): Payer: Commercial Managed Care - PPO | Admitting: Family Medicine

## 2015-08-09 VITALS — BP 150/88 | HR 70 | Temp 98.5°F | Ht 64.0 in | Wt 208.0 lb

## 2015-08-09 DIAGNOSIS — E785 Hyperlipidemia, unspecified: Secondary | ICD-10-CM | POA: Diagnosis not present

## 2015-08-09 DIAGNOSIS — I10 Essential (primary) hypertension: Secondary | ICD-10-CM | POA: Diagnosis not present

## 2015-08-09 DIAGNOSIS — E669 Obesity, unspecified: Secondary | ICD-10-CM

## 2015-08-09 MED ORDER — HYDROCHLOROTHIAZIDE 25 MG PO TABS: 25.0000 mg | ORAL_TABLET | Freq: Every day | ORAL | Status: DC

## 2015-08-09 NOTE — Patient Instructions (Addendum)
Your blood pressure is high  Drink lots of water and avoid other beverages  Avoid sodium and processed foods - look at the DASH eating plan handout  Aim for exercise 5 days per week- work up to that  Also cut portions and overall calories Start HCTZ 25 mg once daily in the am for blood pressure Take care of yourself    Follow up in about a month - we will do labs that day so eat light

## 2015-08-09 NOTE — Assessment & Plan Note (Signed)
New in obese pt with hx of hyperlipidemia and fam hx of HTN and CAD Handouts given on DASH diet and hypertension  Start hctz 25 mg daily  F/u 1 mo for visit and labs Long disc re: lifestyle change for HTN and also wt loss

## 2015-08-09 NOTE — Assessment & Plan Note (Signed)
Disc low sat fat diet Dx with HTN today Check this at next visit

## 2015-08-09 NOTE — Progress Notes (Signed)
Subjective:    Patient ID: Albert Tran, male    DOB: 03-21-1966, 49 y.o.   MRN: [id number]  HPI Here with elevated bp   Was 170/105 at the dentist visit (cleaning)   No flushing or edema   Has hx of HA -had a bad one on Sat -better on Sunday   Has family hx of HTN and CAD Brother had 3 stents put in recently   Wt is up 13 lb since last visit   Was down to 192 several months ago and gained it back  Hard to care for himself when caring for wife with MS   Thinks he could do well with salt /sodium restriction   Has a bp cuff - checks his wife's bp   Hx of cholesterol    Patient Active Problem List   Diagnosis Date Noted  . Essential hypertension 08/09/2015  . Obesity 08/09/2015  . Right-sided chest wall pain 06/19/2012  . Snoring 03/22/2011  . Dysphagia 03/22/2011  . FATTY LIVER DISEASE 06/29/2010  . CHOLELITHIASIS 06/14/2010  . Hyperlipidemia 06/09/2010  . HYPERBILIRUBINEMIA 06/09/2010  . HEADACHE 06/09/2010  . TRANSAMINASES, SERUM, ELEVATED 06/09/2010  . PLANTAR FASCIITIS, LEFT 04/05/2010  . CARPAL TUNNEL SYNDROME 10/11/2007  . VERTIGO 09/10/2007   Past Medical History  Diagnosis Date  . Carpal tunnel syndrome   . Vertigo, intermittent   . Elevated BP   . DDD (degenerative disc disease)   . Headache(784.0)     likely migraines  . Nosebleed     Hx of  . Plantar fasciitis   . Gallstones   . Fatty liver    Past Surgical History  Procedure Laterality Date  . Hernia repair      as a teen  . Laminectomy      LS disc sx  . Tonsillectomy     Social History  Substance Use Topics  . Smoking status: Former Smoker    Quit date: 09/05/1987  . Smokeless tobacco: Former Neurosurgeon    Types: Chew  . Alcohol Use: No   Family History  Problem Relation Age of Onset  . Heart disease Mother     CAD  . Alcohol abuse Father   . Diabetes Father   . Heart disease Father     CAD  . Nephrolithiasis Father   . Obesity Brother   . Cancer Paternal Uncle    bladder CA   No Known Allergies Current Outpatient Prescriptions on File Prior to Visit  Medication Sig Dispense Refill  . aspirin-acetaminophen-caffeine (EXCEDRIN MIGRAINE) 250-250-65 MG per tablet Take 2 tablets by mouth every 6 (six) hours as needed for headache.     No current facility-administered medications on file prior to visit.    Review of Systems    Review of Systems  Constitutional: Negative for fever, appetite change, fatigue and unexpected weight change.  Eyes: Negative for pain and visual disturbance.  Respiratory: Negative for cough and shortness of breath.   Cardiovascular: Negative for cp or palpitations    Gastrointestinal: Negative for nausea, diarrhea and constipation.  Genitourinary: Negative for urgency and frequency.  Skin: Negative for pallor or rash   Neurological: Negative for weakness, light-headedness, numbness and pos for occasional  headaches.  Hematological: Negative for adenopathy. Does not bruise/bleed easily.  Psychiatric/Behavioral: Negative for dysphoric mood. The patient is not nervous/anxious.      Objective:   Physical Exam  Constitutional: He appears well-developed and well-nourished. No distress.  obese and well appearing   Of  note -short stature but large frame (difficult to calc opt BMI)  HENT:  Head: Normocephalic and atraumatic.  Mouth/Throat: Oropharynx is clear and moist.  Eyes: Conjunctivae and EOM are normal. Pupils are equal, round, and reactive to light.  Neck: Normal range of motion. Neck supple. No JVD present. Carotid bruit is not present. No thyromegaly present.  Cardiovascular: Normal rate, regular rhythm, normal heart sounds and intact distal pulses.  Exam reveals no gallop.   Pulmonary/Chest: Effort normal and breath sounds normal. No respiratory distress. He has no wheezes. He has no rales.  No crackles  Abdominal: Soft. Bowel sounds are normal. He exhibits no distension, no abdominal bruit and no mass. There is no  tenderness.  Musculoskeletal: He exhibits no edema.  Lymphadenopathy:    He has no cervical adenopathy.  Neurological: He is alert. He has normal reflexes.  Skin: Skin is warm and dry. No rash noted.  Psychiatric: He has a normal mood and affect.          Assessment & Plan:   Problem List Items Addressed This Visit      Cardiovascular and Mediastinum   Essential hypertension - Primary    New in obese pt with hx of hyperlipidemia and fam hx of HTN and CAD Handouts given on DASH diet and hypertension  Start hctz 25 mg daily  F/u 1 mo for visit and labs Long disc re: lifestyle change for HTN and also wt loss         Relevant Medications   hydrochlorothiazide (HYDRODIURIL) 25 MG tablet     Other   Hyperlipidemia    Disc low sat fat diet Dx with HTN today Check this at next visit       Relevant Medications   hydrochlorothiazide (HYDRODIURIL) 25 MG tablet   Obesity    Discussed how this problem influences overall health and the risks it imposes  Reviewed plan for weight loss with lower calorie diet (via better food choices and also portion control or program like weight watchers) and exercise building up to or more than 30 minutes 5 days per week including some aerobic activity   Biggest barrier is time since pt cares for wife with MS

## 2015-08-09 NOTE — Assessment & Plan Note (Signed)
Discussed how this problem influences overall health and the risks it imposes  Reviewed plan for weight loss with lower calorie diet (via better food choices and also portion control or program like weight watchers) and exercise building up to or more than 30 minutes 5 days per week including some aerobic activity   Biggest barrier is time since pt cares for wife with MS

## 2015-09-14 ENCOUNTER — Ambulatory Visit (INDEPENDENT_AMBULATORY_CARE_PROVIDER_SITE_OTHER): Payer: Commercial Managed Care - PPO | Admitting: Family Medicine

## 2015-09-14 ENCOUNTER — Encounter: Payer: Self-pay | Admitting: Family Medicine

## 2015-09-14 VITALS — BP 140/90 | HR 80 | Temp 98.8°F | Ht 64.0 in | Wt 211.5 lb

## 2015-09-14 DIAGNOSIS — I10 Essential (primary) hypertension: Secondary | ICD-10-CM

## 2015-09-14 DIAGNOSIS — F430 Acute stress reaction: Secondary | ICD-10-CM | POA: Insufficient documentation

## 2015-09-14 DIAGNOSIS — E669 Obesity, unspecified: Secondary | ICD-10-CM

## 2015-09-14 MED ORDER — LISINOPRIL-HYDROCHLOROTHIAZIDE 20-25 MG PO TABS: 1.0000 | ORAL_TABLET | Freq: Every day | ORAL | Status: DC

## 2015-09-14 NOTE — Progress Notes (Signed)
Pre visit review using our clinic review tool, if applicable. No additional management support is needed unless otherwise documented below in the visit note. 

## 2015-09-14 NOTE — Assessment & Plan Note (Signed)
Discussed how this problem influences overall health and the risks it imposes  Reviewed plan for weight loss with lower calorie diet (via better food choices and also portion control or program like weight watchers) and exercise building up to or more than 30 minutes 5 days per week including some aerobic activity    

## 2015-09-14 NOTE — Assessment & Plan Note (Signed)
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

## 2015-09-14 NOTE — Progress Notes (Signed)
Subjective:    Patient ID: Albert Tran, male    DOB: 02/27/1966, 50 y.o.   MRN: [id number]  HPI Here for f/u of HTN  Last visit started on hctz 25 mg  BP Readings from Last 3 Encounters:  09/14/15 168/104  08/09/15 150/88  07/10/13 167/95     At home - has avg 140/100   Thinks he has white coat syndrome Also lots of stress caring for his wife  Not a lot of time to take care of herself    Also disc DASH diet He has started to watch sodium  Buying no salt canned foods when able     Obese Wt is up 3 more lb   (originally lost 5 lb but then gained it back) Tried not to eat a lot of sweets over the holidays  bmi of 36  No regular exercise (since getting rid of his hoarse) He plans to start doing calesthenics  Has a stretching program  He has a stationary bike at home  (he used to be a bike rider)  Patient Active Problem List   Diagnosis Date Noted  . Essential hypertension 08/09/2015  . Obesity 08/09/2015  . Right-sided chest wall pain 06/19/2012  . Snoring 03/22/2011  . Dysphagia 03/22/2011  . FATTY LIVER DISEASE 06/29/2010  . CHOLELITHIASIS 06/14/2010  . Hyperlipidemia 06/09/2010  . HYPERBILIRUBINEMIA 06/09/2010  . HEADACHE 06/09/2010  . TRANSAMINASES, SERUM, ELEVATED 06/09/2010  . PLANTAR FASCIITIS, LEFT 04/05/2010  . CARPAL TUNNEL SYNDROME 10/11/2007  . VERTIGO 09/10/2007   Past Medical History  Diagnosis Date  . Carpal tunnel syndrome   . Vertigo, intermittent   . Elevated BP   . DDD (degenerative disc disease)   . Headache(784.0)     likely migraines  . Nosebleed     Hx of  . Plantar fasciitis   . Gallstones   . Fatty liver    Past Surgical History  Procedure Laterality Date  . Hernia repair      as a teen  . Laminectomy      LS disc sx  . Tonsillectomy     Social History  Substance Use Topics  . Smoking status: Former Smoker    Quit date: 09/05/1987  . Smokeless tobacco: Former Neurosurgeon    Types: Chew  . Alcohol Use: No    Family History  Problem Relation Age of Onset  . Heart disease Mother     CAD  . Alcohol abuse Father   . Diabetes Father   . Heart disease Father     CAD  . Nephrolithiasis Father   . Obesity Brother   . Cancer Paternal Uncle     bladder CA   No Known Allergies Current Outpatient Prescriptions on File Prior to Visit  Medication Sig Dispense Refill  . aspirin-acetaminophen-caffeine (EXCEDRIN MIGRAINE) 250-250-65 MG per tablet Take 2 tablets by mouth every 6 (six) hours as needed for headache.    . hydrochlorothiazide (HYDRODIURIL) 25 MG tablet Take 1 tablet (25 mg total) by mouth daily. 30 tablet 11   No current facility-administered medications on file prior to visit.      Review of Systems Review of Systems  Constitutional: Negative for fever, appetite change,  and unexpected weight change.  Eyes: Negative for pain and visual disturbance.  Respiratory: Negative for cough and shortness of breath.   Cardiovascular: Negative for cp or palpitations    Gastrointestinal: Negative for nausea, diarrhea and constipation.  Genitourinary: Negative for urgency and frequency.  Skin: Negative for pallor or rash   Neurological: Negative for weakness, light-headedness, numbness and headaches.  Hematological: Negative for adenopathy. Does not bruise/bleed easily.  Psychiatric/Behavioral: Negative for dysphoric mood. The patient is not nervous/anxious.  pos for stressors and exhaustion /overwhelmed feeling        Objective:   Physical Exam  Constitutional: He appears well-developed and well-nourished. No distress.  obese and well appearing   HENT:  Head: Normocephalic and atraumatic.  Mouth/Throat: Oropharynx is clear and moist.  Eyes: Conjunctivae and EOM are normal. Pupils are equal, round, and reactive to light.  Neck: Normal range of motion. Neck supple. No JVD present. Carotid bruit is not present. No thyromegaly present.  Cardiovascular: Normal rate, regular rhythm, normal  heart sounds and intact distal pulses.  Exam reveals no gallop.   Pulmonary/Chest: Effort normal and breath sounds normal. No respiratory distress. He has no wheezes. He has no rales.  No crackles  Abdominal: Soft. Bowel sounds are normal. He exhibits no distension, no abdominal bruit and no mass. There is no tenderness.  Musculoskeletal: He exhibits no edema.  Lymphadenopathy:    He has no cervical adenopathy.  Neurological: He is alert. He has normal reflexes.  Skin: Skin is warm and dry. No rash noted.  Psychiatric: He has a normal mood and affect.  Pleasant and talkative           Assessment & Plan:   Problem List Items Addressed This Visit      Cardiovascular and Mediastinum   Essential hypertension - Primary    Improved but not at goal with hctz alone Lower at home - some whitecoat component BP: 140/90 mmHg   Change to lisinopril-hct 20-25 mg daily  Rev poss side eff - will alert us if problems or hypotension  F/u 4-6 weeks Rev low sodium diet and plan for exercise and stress management       Relevant Medications   lisinopril-hydrochlorothiazide (PRINZIDE,ZESTORETIC) 20-25 MG tablet   Other Relevant Orders   CBC with Differential/Platelet   Comprehensive metabolic panel   TSH   Lipid panel     Other   Obesity    Discussed how this problem influences overall health and the risks it imposes  Reviewed plan for weight loss with lower calorie diet (via better food choices and also portion control or program like weight watchers) and exercise building up to or more than 30 minutes 5 days per week including some aerobic activity         Stress reaction    Caring for wife with MS and working  Little time for self care Reviewed stressors/ coping techniques/symptoms/ support sources/ tx options and side effects in detail today  Disc strategy of meditation and mindfulness to help with this - will look into some reading and listening materials  Update if worse or no  improvement

## 2015-09-14 NOTE — Patient Instructions (Addendum)
Hold the hctz and start lisinopril hct one pill per day  If bp is too low (90s/50s) or you get dizzy- stop it and call , or any other side effects like cough  Try to get some exercise on the bike you have (and keep stretching) Drink water/ keep reducing sodium in your diet  Think about meditation for relaxation  Get outdoors all you can    Keep working on weight loss   Follow up in 4-6 weeks   Labs today

## 2015-09-14 NOTE — Assessment & Plan Note (Signed)
Improved but not at goal with hctz alone Lower at home - some whitecoat component BP: 140/90 mmHg   Change to lisinopril-hct 20-25 mg daily  Rev poss side eff - will alert us if problems or hypotension  F/u 4-6 weeks Rev low sodium diet and plan for exercise and stress management

## 2015-09-15 ENCOUNTER — Ambulatory Visit: Payer: Commercial Managed Care - PPO | Admitting: Family Medicine

## 2015-09-15 LAB — CBC WITH DIFFERENTIAL/PLATELET
BASOS PCT: 0.7 % (ref 0.0–3.0)
Basophils Absolute: 0.1 10*3/uL (ref 0.0–0.1)
EOS PCT: 3.8 % (ref 0.0–5.0)
Eosinophils Absolute: 0.4 10*3/uL (ref 0.0–0.7)
HCT: 43.3 % (ref 39.0–52.0)
HEMOGLOBIN: 14.8 g/dL (ref 13.0–17.0)
LYMPHS PCT: 26 % (ref 12.0–46.0)
Lymphs Abs: 2.8 10*3/uL (ref 0.7–4.0)
MCHC: 34.2 g/dL (ref 30.0–36.0)
MCV: 89.2 fl (ref 78.0–100.0)
Monocytes Absolute: 0.9 10*3/uL (ref 0.1–1.0)
Monocytes Relative: 8.3 % (ref 3.0–12.0)
Neutro Abs: 6.7 10*3/uL (ref 1.4–7.7)
Neutrophils Relative %: 61.2 % (ref 43.0–77.0)
Platelets: 253 10*3/uL (ref 150.0–400.0)
RBC: 4.86 Mil/uL (ref 4.22–5.81)
RDW: 12.5 % (ref 11.5–15.5)
WBC: 10.9 10*3/uL — AB (ref 4.0–10.5)

## 2015-09-15 LAB — COMPREHENSIVE METABOLIC PANEL
ALBUMIN: 4.4 g/dL (ref 3.5–5.2)
ALK PHOS: 57 U/L (ref 39–117)
ALT: 39 U/L (ref 0–53)
AST: 29 U/L (ref 0–37)
BUN: 15 mg/dL (ref 6–23)
CALCIUM: 9.6 mg/dL (ref 8.4–10.5)
CO2: 31 mEq/L (ref 19–32)
CREATININE: 1.05 mg/dL (ref 0.40–1.50)
Chloride: 102 mEq/L (ref 96–112)
GFR: 79.94 mL/min (ref >60.00)
Glucose, Bld: 86 mg/dL (ref 70–99)
Potassium: 4.1 mEq/L (ref 3.5–5.1)
Sodium: 141 mEq/L (ref 135–145)
Total Bilirubin: 0.6 mg/dL (ref 0.2–1.2)
Total Protein: 7.1 g/dL (ref 6.0–8.3)

## 2015-09-15 LAB — LIPID PANEL
Cholesterol: 229 mg/dL — ABNORMAL HIGH (ref 0–200)
HDL: 39.2 mg/dL (ref >39.00)
Total CHOL/HDL Ratio: 6
Triglycerides: 569 mg/dL — ABNORMAL HIGH (ref 0.0–149.0)

## 2015-09-15 LAB — LDL CHOLESTEROL, DIRECT: Direct LDL: 135 mg/dL

## 2015-09-15 LAB — TSH: TSH: 2.95 u[IU]/mL (ref 0.35–4.50)

## 2015-09-17 ENCOUNTER — Encounter: Payer: Self-pay | Admitting: *Deleted

## 2015-10-26 ENCOUNTER — Encounter: Payer: Self-pay | Admitting: Family Medicine

## 2015-10-26 ENCOUNTER — Ambulatory Visit (INDEPENDENT_AMBULATORY_CARE_PROVIDER_SITE_OTHER): Payer: Commercial Managed Care - PPO | Admitting: Family Medicine

## 2015-10-26 VITALS — BP 102/70 | HR 77 | Temp 98.2°F | Wt 206.0 lb

## 2015-10-26 DIAGNOSIS — E669 Obesity, unspecified: Secondary | ICD-10-CM | POA: Diagnosis not present

## 2015-10-26 DIAGNOSIS — I10 Essential (primary) hypertension: Secondary | ICD-10-CM

## 2015-10-26 DIAGNOSIS — E785 Hyperlipidemia, unspecified: Secondary | ICD-10-CM

## 2015-10-26 NOTE — Progress Notes (Signed)
Pre visit review using our clinic review tool, if applicable. No additional management support is needed unless otherwise documented below in the visit note. 

## 2015-10-26 NOTE — Progress Notes (Signed)
Subjective:    Patient ID: Albert Tran, male    DOB: March 04, 1966, 50 y.o.   MRN: [id number]  HPI Here for f/u of chronic health problems   bp is much improved today  No cp or palpitations or headaches or edema  No side effects to medicines  BP Readings from Last 3 Encounters:  10/26/15 102/70  09/14/15 140/90  08/09/15 150/88    Last visit added lisinopril to HCTZ Has felt ok with it  No hypotension at home  Usually 120s-130s/70s-80s   Also wt is down 5 lb with bmi of 35 Is working on it  Portion control seems to work the best   Labs done last visit  Lab Results  Component Value Date   WBC 10.9* 09/14/2015   HGB 14.8 09/14/2015   HCT 43.3 09/14/2015   MCV 89.2 09/14/2015   PLT 253.0 09/14/2015     Chemistry      Component Value Date/Time   NA 141 09/14/2015 1628   NA 141 07/09/2013 2256   K 4.1 09/14/2015 1628   K 3.9 07/09/2013 2256   CL 102 09/14/2015 1628   CL 111* 07/09/2013 2256   CO2 31 09/14/2015 1628   CO2 26 07/09/2013 2256   BUN 15 09/14/2015 1628   BUN 23* 07/09/2013 2256   CREATININE 1.05 09/14/2015 1628   CREATININE 1.17 07/09/2013 2256      Component Value Date/Time   CALCIUM 9.6 09/14/2015 1628   CALCIUM 8.6 07/09/2013 2256   ALKPHOS 57 09/14/2015 1628   ALKPHOS 65 07/09/2013 2256   AST 29 09/14/2015 1628   AST 32 07/09/2013 2256   ALT 39 09/14/2015 1628   ALT 48 07/09/2013 2256   BILITOT 0.6 09/14/2015 1628   BILITOT 0.7 07/09/2013 2256      Lab Results  Component Value Date   CHOL 229* 09/14/2015   HDL 39.20 09/14/2015   LDLDIRECT 135.0 09/14/2015   TRIG * 09/14/2015    569.0 Triglyceride is over 400; calculations on Lipids are invalid.   CHOLHDL 6 09/14/2015    Diet is improved now - eating lower fat foods and a lot of salads and healthier foods  Patient Active Problem List   Diagnosis Date Noted  . Stress reaction 09/14/2015  . Essential hypertension 08/09/2015  . Obesity 08/09/2015  . Right-sided chest wall pain  06/19/2012  . Snoring 03/22/2011  . Dysphagia 03/22/2011  . FATTY LIVER DISEASE 06/29/2010  . CHOLELITHIASIS 06/14/2010  . Hyperlipidemia 06/09/2010  . HYPERBILIRUBINEMIA 06/09/2010  . HEADACHE 06/09/2010  . TRANSAMINASES, SERUM, ELEVATED 06/09/2010  . PLANTAR FASCIITIS, LEFT 04/05/2010  . CARPAL TUNNEL SYNDROME 10/11/2007  . VERTIGO 09/10/2007   Past Medical History  Diagnosis Date  . Carpal tunnel syndrome   . Vertigo, intermittent   . Elevated BP   . DDD (degenerative disc disease)   . Headache(784.0)     likely migraines  . Nosebleed     Hx of  . Plantar fasciitis   . Gallstones   . Fatty liver    Past Surgical History  Procedure Laterality Date  . Hernia repair      as a teen  . Laminectomy      LS disc sx  . Tonsillectomy     Social History  Substance Use Topics  . Smoking status: Former Smoker    Quit date: 09/05/1987  . Smokeless tobacco: Former Neurosurgeon    Types: Chew  . Alcohol Use: No   Family History  Problem Relation Age of Onset  . Heart disease Mother     CAD  . Alcohol abuse Father   . Diabetes Father   . Heart disease Father     CAD  . Nephrolithiasis Father   . Obesity Brother   . Cancer Paternal Uncle     bladder CA   No Known Allergies Current Outpatient Prescriptions on File Prior to Visit  Medication Sig Dispense Refill  . aspirin-acetaminophen-caffeine (EXCEDRIN MIGRAINE) 250-250-65 MG per tablet Take 2 tablets by mouth every 6 (six) hours as needed for headache.    . lisinopril-hydrochlorothiazide (PRINZIDE,ZESTORETIC) 20-25 MG tablet Take 1 tablet by mouth daily. 30 tablet 11   No current facility-administered medications on file prior to visit.     Review of Systems Review of Systems  Constitutional: Negative for fever, appetite change, fatigue and unexpected weight change.  Eyes: Negative for pain and visual disturbance.  Respiratory: Negative for cough and shortness of breath.   Cardiovascular: Negative for cp or  palpitations    Gastrointestinal: Negative for nausea, diarrhea and constipation.  Genitourinary: Negative for urgency and frequency.  Skin: Negative for pallor or rash   Neurological: Negative for weakness, light-headedness, numbness and headaches.  Hematological: Negative for adenopathy. Does not bruise/bleed easily.  Psychiatric/Behavioral: Negative for dysphoric mood. The patient is not nervous/anxious.         Objective:   Physical Exam  Constitutional: He appears well-developed and well-nourished. No distress.  obese and well appearing   HENT:  Head: Normocephalic and atraumatic.  Mouth/Throat: Oropharynx is clear and moist.  Eyes: Conjunctivae and EOM are normal. Pupils are equal, round, and reactive to light.  Neck: Normal range of motion. Neck supple. No JVD present. Carotid bruit is not present. No thyromegaly present.  Cardiovascular: Normal rate, regular rhythm, normal heart sounds and intact distal pulses.  Exam reveals no gallop.   Pulmonary/Chest: Effort normal and breath sounds normal. No respiratory distress. He has no wheezes. He has no rales.  No crackles  Abdominal: Soft. Bowel sounds are normal. He exhibits no distension, no abdominal bruit and no mass. There is no tenderness.  Musculoskeletal: He exhibits no edema.  Lymphadenopathy:    He has no cervical adenopathy.  Neurological: He is alert. He has normal reflexes.  Skin: Skin is warm and dry. No rash noted.  Psychiatric: He has a normal mood and affect.          Assessment & Plan:   Problem List Items Addressed This Visit      Cardiovascular and Mediastinum   Essential hypertension - Primary    Much improved with addn of lisinopril to hctz  BP Readings from Last 3 Encounters:  10/26/15 102/70  09/14/15 140/90  08/09/15 150/88   labs reviewed Lifestyle change rev          Other   Hyperlipidemia    Very high trig over 500- this was non fasting  Disc diet for hypertriglyceridemia and  handout given Disc goals for lipids and reasons to control them Rev labs with pt Rev low sat fat diet in detail   Will re check this in 2 mo - med if needed       Obesity    Commended on lifestyle change and wt loss so far  Discussed how this problem influences overall health and the risks it imposes  Reviewed plan for weight loss with lower calorie diet (via better food choices and also portion control or program like weight watchers)  and exercise building up to or more than 30 minutes 5 days per week including some aerobic activity

## 2015-10-26 NOTE — Patient Instructions (Signed)
BP is much better  Good job with weight loss and better diet  Let's re check cholesterol in about 2 months -schedule fasting labs  Stay active  Drink your water   For cholesterol   Avoid red meat/ fried foods/ egg yolks/ fatty breakfast meats/ butter, cheese and high fat dairy/ and shellfish

## 2015-10-28 NOTE — Assessment & Plan Note (Signed)
Commended on lifestyle change and wt loss so far  Discussed how this problem influences overall health and the risks it imposes  Reviewed plan for weight loss with lower calorie diet (via better food choices and also portion control or program like weight watchers) and exercise building up to or more than 30 minutes 5 days per week including some aerobic activity

## 2015-10-28 NOTE — Assessment & Plan Note (Signed)
Much improved with addn of lisinopril to hctz  BP Readings from Last 3 Encounters:  10/26/15 102/70  09/14/15 140/90  08/09/15 150/88   labs reviewed Lifestyle change rev

## 2015-10-28 NOTE — Assessment & Plan Note (Signed)
Very high trig over 500- this was non fasting  Disc diet for hypertriglyceridemia and handout given Disc goals for lipids and reasons to control them Rev labs with pt Rev low sat fat diet in detail   Will re check this in 2 mo - med if needed

## 2015-11-04 ENCOUNTER — Ambulatory Visit (INDEPENDENT_AMBULATORY_CARE_PROVIDER_SITE_OTHER): Payer: Commercial Managed Care - PPO | Admitting: Primary Care

## 2015-11-04 VITALS — BP 144/86 | HR 65 | Temp 98.5°F | Ht 64.0 in | Wt 200.4 lb

## 2015-11-04 DIAGNOSIS — R05 Cough: Secondary | ICD-10-CM

## 2015-11-04 DIAGNOSIS — R059 Cough, unspecified: Secondary | ICD-10-CM

## 2015-11-04 MED ORDER — AZITHROMYCIN 250 MG PO TABS: ORAL_TABLET | ORAL | Status: DC

## 2015-11-04 MED ORDER — HYDROCODONE-HOMATROPINE 5-1.5 MG/5ML PO SYRP: 5.0000 mL | ORAL_SOLUTION | Freq: Every evening | ORAL | Status: DC | PRN

## 2015-11-04 NOTE — Progress Notes (Signed)
Subjective:    Patient ID: Albert Tran, male    DOB: 01/12/1966, 50 y.o.   MRN: [id number]  HPI  Albert Tran is a 50 year old male who presents today with a chief complaint of cough. He also reports sore throat, chest congestion. His symptoms have been present for 1 week. He's taken Robitussin DM, Mucinex without improvement. He's been around his co-workers who are ill with the same symptoms. He's not able to sleep at night due to cough. Non smoker. His cough is non productive and he's not running fevers.    Review of Systems  Constitutional: Positive for fatigue. Negative for fever and chills.  HENT: Positive for congestion, ear pain, sinus pressure and sore throat.   Respiratory: Positive for cough. Negative for shortness of breath and wheezing.   Cardiovascular: Negative for chest pain.       Past Medical History  Diagnosis Date  . Carpal tunnel syndrome   . Vertigo, intermittent   . Elevated BP   . DDD (degenerative disc disease)   . Headache(784.0)     likely migraines  . Nosebleed     Hx of  . Plantar fasciitis   . Gallstones   . Fatty liver     Social History   Social History  . Marital Status: Married    Spouse Name: N/A  . Number of Children: N/A  . Years of Education: N/A   Occupational History  . Not on file.   Social History Main Topics  . Smoking status: Former Smoker    Quit date: 09/05/1987  . Smokeless tobacco: Former Neurosurgeon    Types: Chew  . Alcohol Use: No  . Drug Use: No  . Sexual Activity: Not on file   Other Topics Concern  . Not on file   Social History Narrative    Past Surgical History  Procedure Laterality Date  . Hernia repair      as a teen  . Laminectomy      LS disc sx  . Tonsillectomy      Family History  Problem Relation Age of Onset  . Heart disease Mother     CAD  . Alcohol abuse Father   . Diabetes Father   . Heart disease Father     CAD  . Nephrolithiasis Father   . Obesity Brother   . Cancer  Paternal Uncle     bladder CA    No Known Allergies  Current Outpatient Prescriptions on File Prior to Visit  Medication Sig Dispense Refill  . aspirin-acetaminophen-caffeine (EXCEDRIN MIGRAINE) 250-250-65 MG per tablet Take 2 tablets by mouth every 6 (six) hours as needed for headache.    . lisinopril-hydrochlorothiazide (PRINZIDE,ZESTORETIC) 20-25 MG tablet Take 1 tablet by mouth daily. 30 tablet 11   No current facility-administered medications on file prior to visit.    BP 144/86 mmHg  Pulse 65  Temp(Src) 98.5 F (36.9 C) (Oral)  Ht  (1.626 m)  Wt 200 lb 6.4 oz (90.901 kg)  BMI 34.38 kg/m2  SpO2 97%    Objective:   Physical Exam  Constitutional: He appears well-nourished.  HENT:  Right Ear: Ear canal normal. Tympanic membrane is erythematous.  Left Ear: Tympanic membrane and ear canal normal.  Nose: Right sinus exhibits no maxillary sinus tenderness and no frontal sinus tenderness. Left sinus exhibits no maxillary sinus tenderness and no frontal sinus tenderness.  Mouth/Throat: Oropharynx is clear and moist.  Neck: Neck supple.  Cardiovascular: Normal rate  and regular rhythm.   Pulmonary/Chest: He has no decreased breath sounds. He has no wheezes. He has rhonchi in the right upper field, the right lower field, the left upper field and the left lower field.  Skin: Skin is warm and dry.          Assessment & Plan:  Acute Bronchitis:  Cough, congestion, fatigue, insomnia x 7 days. Overall no improvement with OTC treatment. Cannot talk without coughing. Exam with rhonchi throughout. No decreased sounds or suspicion for pneumonia. RX for Zpak and Hycodan provided. Continue Mucinex or Robitussin. Fluids, rest, return precautions provided.

## 2015-11-04 NOTE — Patient Instructions (Signed)
Start Azithromycin antibiotics. Take 2 tablets by mouth today, then 1 tablet daily for 4 additional days.  You may take the Hycodan cough suppressant at bedtime as needed for cough and rest. Caution this medication contains codeine and will make you feel drowsy.  Increase consumption of fluids and rest.  It was a pleasure meeting you!  Acute Bronchitis Bronchitis is inflammation of the airways that extend from the windpipe into the lungs (bronchi). The inflammation often causes mucus to develop. This leads to a cough, which is the most common symptom of bronchitis.  In acute bronchitis, the condition usually develops suddenly and goes away over time, usually in a couple weeks. Smoking, allergies, and asthma can make bronchitis worse. Repeated episodes of bronchitis may cause further lung problems.  CAUSES Acute bronchitis is most often caused by the same virus that causes a cold. The virus can spread from person to person (contagious) through coughing, sneezing, and touching contaminated objects. SIGNS AND SYMPTOMS   Cough.   Fever.   Coughing up mucus.   Body aches.   Chest congestion.   Chills.   Shortness of breath.   Sore throat.  DIAGNOSIS  Acute bronchitis is usually diagnosed through a physical exam. Your health care provider will also ask you questions about your medical history. Tests, such as chest X-rays, are sometimes done to rule out other conditions.  TREATMENT  Acute bronchitis usually goes away in a couple weeks. Oftentimes, no medical treatment is necessary. Medicines are sometimes given for relief of fever or cough. Antibiotic medicines are usually not needed but may be prescribed in certain situations. In some cases, an inhaler may be recommended to help reduce shortness of breath and control the cough. A cool mist vaporizer may also be used to help thin bronchial secretions and make it easier to clear the chest.  HOME CARE INSTRUCTIONS  Get plenty of  rest.   Drink enough fluids to keep your urine clear or pale yellow (unless you have a medical condition that requires fluid restriction). Increasing fluids may help thin your respiratory secretions (sputum) and reduce chest congestion, and it will prevent dehydration.   Take medicines only as directed by your health care provider.  If you were prescribed an antibiotic medicine, finish it all even if you start to feel better.  Avoid smoking and secondhand smoke. Exposure to cigarette smoke or irritating chemicals will make bronchitis worse. If you are a smoker, consider using nicotine gum or skin patches to help control withdrawal symptoms. Quitting smoking will help your lungs heal faster.   Reduce the chances of another bout of acute bronchitis by washing your hands frequently, avoiding people with cold symptoms, and trying not to touch your hands to your mouth, nose, or eyes.   Keep all follow-up visits as directed by your health care provider.  SEEK MEDICAL CARE IF: Your symptoms do not improve after 1 week of treatment.  SEEK IMMEDIATE MEDICAL CARE IF:  You develop an increased fever or chills.   You have chest pain.   You have severe shortness of breath.  You have bloody sputum.   You develop dehydration.  You faint or repeatedly feel like you are going to pass out.  You develop repeated vomiting.  You develop a severe headache. MAKE SURE YOU:   Understand these instructions.  Will watch your condition.  Will get help right away if you are not doing well or get worse.   This information is not intended to replace  advice given to you by your health care provider. Make sure you discuss any questions you have with your health care provider.   Document Released: 09/28/2004 Document Revised: 09/11/2014 Document Reviewed: 02/11/2013 Elsevier Interactive Patient Education [employer].

## 2015-11-04 NOTE — Progress Notes (Signed)
Pre visit review using our clinic review tool, if applicable. No additional management support is needed unless otherwise documented below in the visit note. 

## 2015-11-15 ENCOUNTER — Telehealth: Payer: Self-pay | Admitting: Family Medicine

## 2015-11-15 ENCOUNTER — Ambulatory Visit (INDEPENDENT_AMBULATORY_CARE_PROVIDER_SITE_OTHER): Payer: Commercial Managed Care - PPO | Admitting: Family Medicine

## 2015-11-15 ENCOUNTER — Ambulatory Visit (INDEPENDENT_AMBULATORY_CARE_PROVIDER_SITE_OTHER)
Admission: RE | Admit: 2015-11-15 | Discharge: 2015-11-15 | Disposition: A | Discharge status: Home / Self Care | Payer: Commercial Managed Care - PPO | Source: Ambulatory Visit | Attending: Family Medicine | Admitting: Family Medicine

## 2015-11-15 ENCOUNTER — Encounter: Payer: Self-pay | Admitting: Family Medicine

## 2015-11-15 VITALS — BP 116/82 | HR 80 | Temp 98.5°F | Ht 64.0 in | Wt 197.5 lb

## 2015-11-15 DIAGNOSIS — M2292 Unspecified disorder of patella, left knee: Secondary | ICD-10-CM

## 2015-11-15 DIAGNOSIS — M2290 Unspecified disorder of patella, unspecified knee: Secondary | ICD-10-CM | POA: Insufficient documentation

## 2015-11-15 DIAGNOSIS — M25562 Pain in left knee: Secondary | ICD-10-CM | POA: Insufficient documentation

## 2015-11-15 MED ORDER — MELOXICAM 15 MG PO TABS: 15.0000 mg | ORAL_TABLET | Freq: Every day | ORAL | Status: DC | PRN

## 2015-11-15 NOTE — Progress Notes (Signed)
Pre visit review using our clinic review tool, if applicable. No additional management support is needed unless otherwise documented below in the visit note. 

## 2015-11-15 NOTE — Progress Notes (Signed)
Subjective:    Patient ID: Albert Tran, male    DOB: December 11, 1965, 50 y.o.   MRN: [id number]  HPI Here with L knee pain   He knelt on a screw accidentally while he was wood working- on [employer] - hurt initially and then kept working  Did not pierce the skin  Got more painful through the weekend and now much more painful  Hurts to bend and fully extend it   Does not rememer twisting his knee  Did not hear a pop   No swollen  Not red   Not taking anything otc for pain  Hobbling around   Tender area is over patellar (worse on the right)   Patient Active Problem List   Diagnosis Date Noted  . Knee pain, left 11/15/2015  . Stress reaction 09/14/2015  . Essential hypertension 08/09/2015  . Obesity 08/09/2015  . Right-sided chest wall pain 06/19/2012  . Snoring 03/22/2011  . Dysphagia 03/22/2011  . FATTY LIVER DISEASE 06/29/2010  . CHOLELITHIASIS 06/14/2010  . Hyperlipidemia 06/09/2010  . HYPERBILIRUBINEMIA 06/09/2010  . HEADACHE 06/09/2010  . TRANSAMINASES, SERUM, ELEVATED 06/09/2010  . PLANTAR FASCIITIS, LEFT 04/05/2010  . CARPAL TUNNEL SYNDROME 10/11/2007  . VERTIGO 09/10/2007   Past Medical History  Diagnosis Date  . Carpal tunnel syndrome   . Vertigo, intermittent   . Elevated BP   . DDD (degenerative disc disease)   . Headache(784.0)     likely migraines  . Nosebleed     Hx of  . Plantar fasciitis   . Gallstones   . Fatty liver    Past Surgical History  Procedure Laterality Date  . Hernia repair      as a teen  . Laminectomy      LS disc sx  . Tonsillectomy     Social History  Substance Use Topics  . Smoking status: Former Smoker    Quit date: 09/05/1987  . Smokeless tobacco: Former Neurosurgeon    Types: Chew  . Alcohol Use: No   Family History  Problem Relation Age of Onset  . Heart disease Mother     CAD  . Alcohol abuse Father   . Diabetes Father   . Heart disease Father     CAD  . Nephrolithiasis Father   . Obesity Brother   . Cancer  Paternal Uncle     bladder CA   No Known Allergies Current Outpatient Prescriptions on File Prior to Visit  Medication Sig Dispense Refill  . aspirin-acetaminophen-caffeine (EXCEDRIN MIGRAINE) 250-250-65 MG per tablet Take 2 tablets by mouth every 6 (six) hours as needed for headache.    . lisinopril-hydrochlorothiazide (PRINZIDE,ZESTORETIC) 20-25 MG tablet Take 1 tablet by mouth daily. 30 tablet 11   No current facility-administered medications on file prior to visit.     Review of Systems Review of Systems  Constitutional: Negative for fever, appetite change, fatigue and unexpected weight change.  Eyes: Negative for pain and visual disturbance.  Respiratory: Negative for cough and shortness of breath.   Cardiovascular: Negative for cp or palpitations    Gastrointestinal: Negative for nausea, diarrhea and constipation.  Genitourinary: Negative for urgency and frequency.  Skin: Negative for pallor or rash   MSK pos for L knee pain , neg for swelling  Neurological: Negative for weakness, light-headedness, numbness and headaches.  Hematological: Negative for adenopathy. Does not bruise/bleed easily.  Psychiatric/Behavioral: Negative for dysphoric mood. The patient is not nervous/anxious.         Objective:  Physical Exam  Constitutional: He appears well-developed and well-nourished. No distress.  overwt and well appearing   Eyes: Conjunctivae and EOM are normal. Pupils are equal, round, and reactive to light.  Cardiovascular: Normal rate and regular rhythm.   Musculoskeletal:       Left knee: He exhibits decreased range of motion and bony tenderness. He exhibits no swelling, no ecchymosis, no laceration, no erythema, normal alignment, no LCL laxity and normal patellar mobility. Tenderness found. Medial joint line tenderness noted.  L knee is tender over medial patella and medial joint line  Pt is unable to flex knee more than 10-20 degrees due to pain  Difficult to assess for  effusion Knee is warm but not obviously swollen and not erythematous  No crepitus  Is not unstable   Pt can stand and walk with knee extended   Neurological: He is alert. He has normal reflexes. He displays no atrophy. No sensory deficit. He exhibits normal muscle tone.  Skin: Skin is warm and dry. No rash noted. No erythema. No pallor.  Psychiatric: He has a normal mood and affect.          Assessment & Plan:   Problem List Items Addressed This Visit      Other   Knee pain, left - Primary    This started to build several days after accidentally kneeling on a wood screw (no broken skin or sign of infection) Now very painful to flex knee and tender over medial patella and med joint line XR today  Ice and elevation  Tried knee immobilizer- was too uncomfortable-so wrapped firmly in ace bandage  mobic 15 mg daily with food  Pending rad review       Relevant Orders   DG Knee 4 Views W/Patella Left (Completed)

## 2015-11-15 NOTE — Patient Instructions (Signed)
Start using a cold compress on your knee  Elevate it when you can Use ace to support it  I will contact you with formal xray report and make a plan  Take the meloxicam 15 mg once daily with food

## 2015-11-15 NOTE — Assessment & Plan Note (Signed)
This started to build several days after accidentally kneeling on a wood screw (no broken skin or sign of infection) Now very painful to flex knee and tender over medial patella and med joint line XR today  Ice and elevation  Tried knee immobilizer- was too uncomfortable-so wrapped firmly in ace bandage  mobic 15 mg daily with food  Pending rad review

## 2015-11-15 NOTE — Telephone Encounter (Signed)
Urgent ref to orthopedics Knee pain - unable to flex knee Large patellar bone spur seen on xray  [facility]  His phone under demographics is his cell number-can leave message

## 2015-11-17 ENCOUNTER — Other Ambulatory Visit: Payer: Self-pay | Admitting: Orthopedic Surgery

## 2015-11-17 ENCOUNTER — Ambulatory Visit
Admission: RE | Admit: 2015-11-17 | Discharge: 2015-11-17 | Disposition: A | Discharge status: Home / Self Care | Payer: Commercial Managed Care - PPO | Source: Ambulatory Visit | Attending: Orthopedic Surgery | Admitting: Orthopedic Surgery

## 2015-11-17 DIAGNOSIS — M25461 Effusion, right knee: Secondary | ICD-10-CM | POA: Diagnosis not present

## 2015-11-17 DIAGNOSIS — M659 Synovitis and tenosynovitis, unspecified: Secondary | ICD-10-CM | POA: Insufficient documentation

## 2015-11-17 DIAGNOSIS — M2242 Chondromalacia patellae, left knee: Secondary | ICD-10-CM | POA: Diagnosis not present

## 2015-11-17 DIAGNOSIS — M25562 Pain in left knee: Secondary | ICD-10-CM

## 2015-12-24 ENCOUNTER — Other Ambulatory Visit (INDEPENDENT_AMBULATORY_CARE_PROVIDER_SITE_OTHER): Payer: Commercial Managed Care - PPO

## 2015-12-24 DIAGNOSIS — E785 Hyperlipidemia, unspecified: Secondary | ICD-10-CM | POA: Diagnosis not present

## 2015-12-24 LAB — LIPID PANEL
Cholesterol: 212 mg/dL — ABNORMAL HIGH (ref 0–200)
HDL: 37.8 mg/dL — ABNORMAL LOW (ref >39.00)
Total CHOL/HDL Ratio: 6

## 2015-12-24 LAB — ALT: ALT: 39 U/L (ref 0–53)

## 2015-12-24 LAB — LDL CHOLESTEROL, DIRECT: LDL DIRECT: 103 mg/dL

## 2015-12-24 LAB — AST: AST: 31 U/L (ref 0–37)

## 2015-12-28 ENCOUNTER — Telehealth: Payer: Self-pay | Admitting: Family Medicine

## 2015-12-28 NOTE — Telephone Encounter (Signed)
Addressed through results notes  

## 2015-12-28 NOTE — Telephone Encounter (Signed)
Patient returned Shapale's call. °

## 2016-01-07 ENCOUNTER — Encounter: Payer: Self-pay | Admitting: Family Medicine

## 2016-01-07 ENCOUNTER — Ambulatory Visit (INDEPENDENT_AMBULATORY_CARE_PROVIDER_SITE_OTHER): Payer: Commercial Managed Care - PPO | Admitting: Family Medicine

## 2016-01-07 VITALS — BP 132/88 | HR 86 | Temp 99.1°F | Ht 64.0 in | Wt 200.2 lb

## 2016-01-07 DIAGNOSIS — E785 Hyperlipidemia, unspecified: Secondary | ICD-10-CM

## 2016-01-07 NOTE — Progress Notes (Signed)
   Subjective:    Patient ID: Albert Tran, male    DOB: [birth year], 50 y.o.   MRN: [id number]  HPI Here for f/u of hyperlipidemia   Lab Results  Component Value Date   CHOL 212* 12/24/2015   CHOL 229* 09/14/2015   CHOL 224* 06/08/2010   Lab Results  Component Value Date   HDL 37.80* 12/24/2015   HDL 39.20 09/14/2015   HDL 42.70 06/08/2010   [facility] results found for: [facility] Lab Results  Component Value Date   TRIG * 12/24/2015    423.0 Triglyceride is over 400; calculations on Lipids are invalid.   TRIG * 09/14/2015    569.0 Triglyceride is over 400; calculations on Lipids are invalid.   TRIG 189.0* 06/08/2010   Lab Results  Component Value Date   CHOLHDL 6 12/24/2015   CHOLHDL 6 09/14/2015   CHOLHDL 5 06/08/2010   Lab Results  Component Value Date   LDLDIRECT 103.0 12/24/2015   LDLDIRECT 135.0 09/14/2015   LDLDIRECT 154.6 06/08/2010   Has really worked on diet  Is eating salads Smaller portions  More chicken and fish  Less beef  occ pizza (cheese)  Not much fried foods but he has it once in a while   occ butterscotch hard candies -- not a big sugar eater / is cutting potatoes and carbs   In the last 2 months - less exercise with knee injury (getting better slowly) Started using exercise bike yesterday   Does not drink alcohol   Lab Results  Component Value Date   ALT 39 12/24/2015   AST 31 12/24/2015   ALKPHOS 57 09/14/2015   BILITOT 0.6 09/14/2015   Hx of fatty liver that has not caused problems  Plans to loose wt in the next 3 months  Review of Systems     Objective:   Physical Exam        Assessment & Plan:

## 2016-01-07 NOTE — Patient Instructions (Addendum)
For cholesterol : Eat more fish (with fins) , and consider taking omega 3 fish oil over the counter - 1000-3000 mg daily - for HDL Also exercise - for HDL  For LDL  Avoid red meat/ fried foods/ egg yolks/ fatty breakfast meats/ butter, cheese and high fat dairy/ and shellfish   For triglycerides - avoid all fats when able and also sugar and sweets   If no improvement in triglycerides we may consider a fibrate medicine like tricor (generic)   Schedule fasting labs in 3 mo

## 2016-01-07 NOTE — Progress Notes (Signed)
Pre visit review using our clinic review tool, if applicable. No additional management support is needed unless otherwise documented below in the visit note. 

## 2016-01-09 NOTE — Assessment & Plan Note (Signed)
LDL is down  HDL is lower than goal  Trig still high but improved-meets criteria for a fibrate Counseled on diet-he is not ready for medication yet  Disc fibrate-info given on tricor  For cholesterol : Eat more fish (with fins) , and consider taking omega 3 fish oil over the counter - 1000-3000 mg daily - for HDL Also exercise - for HDL  For LDL  Avoid red meat/ fried foods/ egg yolks/ fatty breakfast meats/ butter, cheese and high fat dairy/ and shellfish   For triglycerides - avoid all fats when able and also sugar and sweets   If no improvement in triglycerides we may consider a fibrate medicine like tricor (generic)   Schedule fasting labs in 3 mo

## 2016-02-08 ENCOUNTER — Other Ambulatory Visit: Payer: Self-pay | Admitting: *Deleted

## 2016-02-08 MED ORDER — MELOXICAM 15 MG PO TABS: 15.0000 mg | ORAL_TABLET | Freq: Every day | ORAL | Status: DC | PRN

## 2016-02-08 MED ORDER — LISINOPRIL-HYDROCHLOROTHIAZIDE 20-25 MG PO TABS: 1.0000 | ORAL_TABLET | Freq: Every day | ORAL | Status: DC

## 2016-02-08 NOTE — Telephone Encounter (Signed)
done

## 2016-02-08 NOTE — Telephone Encounter (Signed)
Now that he is on lisinopril-hct he should not need the additional hctz (he used to take just that)  Please refill lisinopril hct and mobic for a year

## 2016-02-08 NOTE — Telephone Encounter (Signed)
Fax refill request, wants meds sent to mail order pharmacy, also Rx request had a refill request of HCTZ (no dose/mg on fax) on it but med isn't on med list, please advise

## 2016-06-15 ENCOUNTER — Encounter: Payer: Self-pay | Admitting: Primary Care

## 2016-06-15 ENCOUNTER — Ambulatory Visit (INDEPENDENT_AMBULATORY_CARE_PROVIDER_SITE_OTHER): Payer: Commercial Managed Care - PPO | Admitting: Primary Care

## 2016-06-15 VITALS — BP 154/94 | HR 69 | Temp 98.3°F | Ht 64.0 in | Wt 195.4 lb

## 2016-06-15 DIAGNOSIS — M62838 Other muscle spasm: Secondary | ICD-10-CM

## 2016-06-15 MED ORDER — METHOCARBAMOL 500 MG PO TABS: 500.0000 mg | ORAL_TABLET | Freq: Three times a day (TID) | ORAL | 0 refills | Status: DC | PRN

## 2016-06-15 NOTE — Progress Notes (Signed)
Subjective:    Patient ID: Albert Tran, male    DOB: [birth year], 50 y.o.   MRN: [id number]  HPI  Albert Tran is a 50 year old male who presents today with a chief complaint of neck and shoulder pain. His pain is located to the left base of neck and back of his left shoulder that has been present for the past 1 week. His pain progressed yesterday after waking from a nap. He's been working 20 hour days at work and has been under intense stress as he is getting ready for an audit. He describes his pain as tightness with inability to move his neck side to side without pain. Denies numbness/tinligng, recent injury/trauma. He's taken Aleve without improvement.   Review of Systems  Eyes: Negative for visual disturbance.  Musculoskeletal: Positive for myalgias. Negative for arthralgias and joint swelling.  Skin: Negative for color change.  Neurological: Negative for weakness and numbness.       Past Medical History:  Diagnosis Date  . Carpal tunnel syndrome   . DDD (degenerative disc disease)   . Elevated BP   . Fatty liver   . Gallstones   . Headache(784.0)    likely migraines  . Nosebleed    Hx of  . Plantar fasciitis   . Vertigo, intermittent      Social History   Social History  . Marital status: Married    Spouse name: N/A  . Number of children: N/A  . Years of education: N/A   Occupational History  . Not on file.   Social History Main Topics  . Smoking status: Former Smoker    Quit date: 09/05/1987  . Smokeless tobacco: Former Neurosurgeon    Types: Chew  . Alcohol use No  . Drug use: No  . Sexual activity: Not on file   Other Topics Concern  . Not on file   Social History Narrative  . No narrative on file    Past Surgical History:  Procedure Laterality Date  . HERNIA REPAIR     as a teen  . LAMINECTOMY     LS disc sx  . TONSILLECTOMY      Family History  Problem Relation Age of Onset  . Heart disease Mother     CAD  . Alcohol abuse Father   .  Diabetes Father   . Heart disease Father     CAD  . Nephrolithiasis Father   . Obesity Brother   . Cancer Paternal Uncle     bladder CA    No Known Allergies  Current Outpatient Prescriptions on File Prior to Visit  Medication Sig Dispense Refill  . aspirin-acetaminophen-caffeine (EXCEDRIN MIGRAINE) 250-250-65 MG per tablet Take 2 tablets by mouth every 6 (six) hours as needed for headache.    . lisinopril-hydrochlorothiazide (PRINZIDE,ZESTORETIC) 20-25 MG tablet Take 1 tablet by mouth daily. 90 tablet 3   No current facility-administered medications on file prior to visit.     BP (!) 154/94   Pulse 69   Temp 98.3 F (36.8 C) (Oral)   Ht 5\' 4"  (1.626 m)   Wt 195 lb 6.4 oz (88.6 kg)   SpO2 96%   BMI 33.54 kg/m    Objective:   Physical Exam  Constitutional: He appears well-nourished.  Neck: Neck supple. Muscular tenderness present. No spinous process tenderness present. Decreased range of motion present.  Cardiovascular: Normal rate and regular rhythm.   Pulmonary/Chest: Effort normal and breath sounds normal.  Musculoskeletal:  Left shoulder: He exhibits normal range of motion, no tenderness, no bony tenderness, no swelling and no pain.  Muscle tension to left lateral neck and posterior shoulder.  Skin: Skin is warm and dry.          Assessment & Plan:  Torticollis:  Pain to left lateral neck and left posterior shoulder x 1 week. He's been under a tremendous amount of stress at work recently. Pain worse after waking from a nap. Exam today with significant decrease in ROM to neck. Obvious muscle tension upon palpation. Rx for Methocarbamol sent to pharmacy. Discussed use of Aleve and heating pads, stretching. Follow up PRN.  Albert Tran,Albert Line Kendal, NP

## 2016-06-15 NOTE — Progress Notes (Signed)
Pre visit review using our clinic review tool, if applicable. No additional management support is needed unless otherwise documented below in the visit note. 

## 2016-06-15 NOTE — Patient Instructions (Signed)
You have a large muscle spasm to the neck which is causing your inability to move, and also the pain in your shoulder.  Start Methocarbamol 500 mg tablets for muscle tension/spasm. Take 1 tablet by mouth every 8 hours as needed.   Continue Aleve as discussed.  Apply a heating pad to your neck three times daily for 30 minute intervals.   Try stretching exercises if possible.  It was a pleasure meeting you!

## 2016-06-19 ENCOUNTER — Ambulatory Visit (INDEPENDENT_AMBULATORY_CARE_PROVIDER_SITE_OTHER): Payer: Commercial Managed Care - PPO | Admitting: Family Medicine

## 2016-06-19 ENCOUNTER — Encounter: Payer: Self-pay | Admitting: Family Medicine

## 2016-06-19 VITALS — BP 118/70 | HR 76 | Temp 98.5°F | Ht 64.0 in

## 2016-06-19 DIAGNOSIS — M62838 Other muscle spasm: Secondary | ICD-10-CM | POA: Insufficient documentation

## 2016-06-19 NOTE — Patient Instructions (Signed)
Try aleve 1-2 pills with food every 12 hours as needed  Continue the muscle relaxer as needed  Continue heat on and off  Stretch your neck - gentle stretches in each direction the best you can  Update if not starting to improve in a week or if worsening   If you develop numbness or tingling anywhere -also alert me

## 2016-06-19 NOTE — Progress Notes (Signed)
Subjective:    Patient ID: Albert PalingDaniel S Borgen, male    DOB: May 12, 1966, 50 y.o.   MRN: [id number]  HPI Here for ongoing shoulder and back pain left sided Saw NP on 10/12 and given methocarbamol and disc use of aleve and heat   Pt states it started as tightness in trapezius area  Then became severe  Then other side hurt  Then both sides hurt   The muscle relaxer is helping  Only took aleve one day  Has used heat 3-4 times per day- helps some   No scapular pain  Hurts to move head and arms   Working a lot -in an Engineer, [employer]  Moves a lot -not sitting in one position all day   Has different pillows  Does better with a flat pillow  Memory foam is too stiff  Mainly sleeps on his sides   Patient Active Problem List   Diagnosis Date Noted  . Trapezius muscle spasm 06/19/2016  . Knee pain, left 11/15/2015  . Disorder of patella 11/15/2015  . Stress reaction 09/14/2015  . Essential hypertension 08/09/2015  . Obesity 08/09/2015  . Right-sided chest wall pain 06/19/2012  . Snoring 03/22/2011  . Dysphagia 03/22/2011  . FATTY LIVER DISEASE 06/29/2010  . CHOLELITHIASIS 06/14/2010  . Hyperlipidemia 06/09/2010  . HYPERBILIRUBINEMIA 06/09/2010  . HEADACHE 06/09/2010  . TRANSAMINASES, SERUM, ELEVATED 06/09/2010  . PLANTAR FASCIITIS, LEFT 04/05/2010  . CARPAL TUNNEL SYNDROME 10/11/2007  . VERTIGO 09/10/2007   Past Medical History:  Diagnosis Date  . Carpal tunnel syndrome   . DDD (degenerative disc disease)   . Elevated BP   . Fatty liver   . Gallstones   . Headache(784.0)    likely migraines  . Nosebleed    Hx of  . Plantar fasciitis   . Vertigo, intermittent    Past Surgical History:  Procedure Laterality Date  . HERNIA REPAIR     as a teen  . LAMINECTOMY     LS disc sx  . TONSILLECTOMY     Social History  Substance Use Topics  . Smoking status: Former Smoker    Quit date: 09/05/1987  . Smokeless tobacco: Former NeurosurgeonUser    Types: Chew  . Alcohol use No   Family  History  Problem Relation Age of Onset  . Heart disease Mother     CAD  . Alcohol abuse Father   . Diabetes Father   . Heart disease Father     CAD  . Nephrolithiasis Father   . Obesity Brother   . Cancer Paternal Uncle     bladder CA   No Known Allergies Current Outpatient Prescriptions on File Prior to Visit  Medication Sig Dispense Refill  . aspirin-acetaminophen-caffeine (EXCEDRIN MIGRAINE) 250-250-65 MG per tablet Take 2 tablets by mouth every 6 (six) hours as needed for headache.    . lisinopril-hydrochlorothiazide (PRINZIDE,ZESTORETIC) 20-25 MG tablet Take 1 tablet by mouth daily. 90 tablet 3  . methocarbamol (ROBAXIN) 500 MG tablet Take 1 tablet (500 mg total) by mouth every 8 (eight) hours as needed for muscle spasms. 21 tablet 0   No current facility-administered medications on file prior to visit.      Review of Systems Review of Systems  Constitutional: Negative for fever, appetite change, fatigue and unexpected weight change.  Eyes: Negative for pain and visual disturbance.  Respiratory: Negative for cough and shortness of breath.   Cardiovascular: Negative for cp or palpitations    Gastrointestinal: Negative for nausea, diarrhea and  constipation.  Genitourinary: Negative for urgency and frequency.  Skin: Negative for pallor or rash   MSK pos for neck and shoulder pain bilat/on and off Neurological: Negative for weakness, light-headedness, numbness and headaches.  Hematological: Negative for adenopathy. Does not bruise/bleed easily.  Psychiatric/Behavioral: Negative for dysphoric mood. The patient is not nervous/anxious.         Objective:   Physical Exam  Constitutional: He appears well-developed and well-nourished.  HENT:  Head: Normocephalic and atraumatic.  Eyes: Conjunctivae and EOM are normal. Pupils are equal, round, and reactive to light. No scleral icterus.  Neck: Normal range of motion. Neck supple. No JVD present. No tracheal deviation present. No  thyromegaly present.  Nl rom with most pain from L rotation and end point of flexion  No crepitus or bony tenderness Tender in L cervical musculature and trapezius (no rhomboid pain)  No neuro def  Musculoskeletal: He exhibits tenderness. He exhibits no edema or deformity.  Lymphadenopathy:    He has no cervical adenopathy.  Neurological: He is alert. He displays no atrophy. No cranial nerve deficit or sensory deficit. He exhibits normal muscle tone. Coordination and gait normal.  Skin: Skin is warm and dry. No rash noted. No erythema. No pallor.  Psychiatric: He has a normal mood and affect.          Assessment & Plan:   Problem List Items Addressed This Visit      Musculoskeletal and Integument   Trapezius muscle spasm    Pt has exp intermittent spasm of trapezius that switched sides  Overall starting to improve  Disc muscle relaxer Urged him to start NSAID  Heat/stretching/ good pillow  Nl rom of shoulders/neck and back are re assuring If no further imp consider films and PT       Other Visit Diagnoses   None.

## 2016-06-19 NOTE — Assessment & Plan Note (Signed)
Pt has exp intermittent spasm of trapezius that switched sides  Overall starting to improve  Disc muscle relaxer Urged him to start NSAID  Heat/stretching/ good pillow  Nl rom of shoulders/neck and back are re assuring If no further imp consider films and PT

## 2016-09-12 ENCOUNTER — Telehealth: Payer: Self-pay | Admitting: Family Medicine

## 2016-09-12 ENCOUNTER — Ambulatory Visit (INDEPENDENT_AMBULATORY_CARE_PROVIDER_SITE_OTHER): Payer: Commercial Managed Care - PPO | Admitting: Family Medicine

## 2016-09-12 ENCOUNTER — Encounter: Payer: Self-pay | Admitting: Family Medicine

## 2016-09-12 ENCOUNTER — Ambulatory Visit (INDEPENDENT_AMBULATORY_CARE_PROVIDER_SITE_OTHER)
Admission: RE | Admit: 2016-09-12 | Discharge: 2016-09-12 | Disposition: A | Discharge status: Home / Self Care | Payer: Commercial Managed Care - PPO | Source: Ambulatory Visit | Attending: Family Medicine | Admitting: Family Medicine

## 2016-09-12 VITALS — BP 116/72 | HR 75 | Temp 99.1°F | Ht 64.0 in | Wt 205.5 lb

## 2016-09-12 DIAGNOSIS — M542 Cervicalgia: Secondary | ICD-10-CM

## 2016-09-12 DIAGNOSIS — E7800 Pure hypercholesterolemia, unspecified: Secondary | ICD-10-CM

## 2016-09-12 DIAGNOSIS — M62838 Other muscle spasm: Secondary | ICD-10-CM

## 2016-09-12 DIAGNOSIS — I10 Essential (primary) hypertension: Secondary | ICD-10-CM

## 2016-09-12 DIAGNOSIS — Z8249 Family history of ischemic heart disease and other diseases of the circulatory system: Secondary | ICD-10-CM

## 2016-09-12 DIAGNOSIS — M47812 Spondylosis without myelopathy or radiculopathy, cervical region: Secondary | ICD-10-CM | POA: Diagnosis not present

## 2016-09-12 MED ORDER — METHOCARBAMOL 500 MG PO TABS: 500.0000 mg | ORAL_TABLET | Freq: Three times a day (TID) | ORAL | 0 refills | Status: DC | PRN

## 2016-09-12 NOTE — Patient Instructions (Addendum)
Xray of neck today  Use heat and muscle relaxer as needed  Aleve is ok too-take with food  Will make a plan when results return   For cholesterol eat a healthy diet  Reduce refined carbs and simple sugars (processed foods) for triglycerides Animal fats for your LDL  Increase exercise for HDL  Stop at check out for referral for cardiology to discuss your family history

## 2016-09-12 NOTE — Assessment & Plan Note (Signed)
Rev lipids in setting of fam hx of CAD Disc red carbs for trig Red trans fats for LDL Exercise for HDL   Handout given

## 2016-09-12 NOTE — Telephone Encounter (Signed)
Ref done  Will route to [facility] 

## 2016-09-12 NOTE — Assessment & Plan Note (Signed)
Ongoing L side -rad to trap w/o neuro symptoms  Worse with tilting and rotation  Ongoing for months  Massage/heat /robaxin  Xray of cs today and plan from there

## 2016-09-12 NOTE — Telephone Encounter (Signed)
-----   Message from Shon MilletShapale M [place], [place]MA sent at 09/12/2016  4:11 PM EST ----- Pt had not viewed results on Mychart so I read Dr. Royden Purlower's comments. Pt does agree with PT, please put referral in and I [facility]

## 2016-09-12 NOTE — Assessment & Plan Note (Signed)
bp in fair control at this time  BP Readings from Last 1 Encounters:  09/12/16 116/72   No changes needed Disc lifstyle change with low sodium diet and exercise

## 2016-09-12 NOTE — Progress Notes (Signed)
Pre visit review using our clinic review tool, if applicable. No additional management support is needed unless otherwise documented below in the visit note. 

## 2016-09-12 NOTE — Progress Notes (Signed)
Subjective:    Patient ID: Albert Tran, male    DOB: [birth year], 51 y.o.   MRN: [id number]  HPI Here for continued L sided trapezius/neck pain  It stopped improving  It radiates up neck to occiput  Also occ goes to his shoulder    It gets stiff after inactivity   Was given robaxin -that was helpful when he had it  Not taking any ibuprofen or pain relievers   Most painful position- tilting to the R and then moving  Also rotation both ways  Feels crunching/ noise in neck  Arm movement does not bother it   Not using heat  No numbness or tingling   He is down to a regular pillow with a thin one on top  Head and neck are ok when he is on his side  Better on the L side than the R  It will wake him up   Also has new fam hx of CAD Father,sister, brother  Young  BP Readings from Last 3 Encounters:  09/12/16 116/72  06/19/16 118/70  06/15/16 (!) 154/94   Lab Results  Component Value Date   CHOL 212 (H) 12/24/2015   HDL 37.80 (L) 12/24/2015   LDLDIRECT 103.0 12/24/2015   TRIG (H) 12/24/2015    423.0 Triglyceride is over 400; calculations on Lipids are invalid.   CHOLHDL 6 12/24/2015     Patient Active Problem List   Diagnosis Date Noted  . Trapezius muscle spasm 06/19/2016  . Knee pain, left 11/15/2015  . Disorder of patella 11/15/2015  . Stress reaction 09/14/2015  . Essential hypertension 08/09/2015  . Obesity 08/09/2015  . Right-sided chest wall pain 06/19/2012  . Snoring 03/22/2011  . Dysphagia 03/22/2011  . FATTY LIVER DISEASE 06/29/2010  . CHOLELITHIASIS 06/14/2010  . Hyperlipidemia 06/09/2010  . HYPERBILIRUBINEMIA 06/09/2010  . HEADACHE 06/09/2010  . TRANSAMINASES, SERUM, ELEVATED 06/09/2010  . PLANTAR FASCIITIS, LEFT 04/05/2010  . CARPAL TUNNEL SYNDROME 10/11/2007  . VERTIGO 09/10/2007   Past Medical History:  Diagnosis Date  . Carpal tunnel syndrome   . DDD (degenerative disc disease)   . Elevated BP   . Fatty liver   . Gallstones   .  Headache(784.0)    likely migraines  . Nosebleed    Hx of  . Plantar fasciitis   . Vertigo, intermittent    Past Surgical History:  Procedure Laterality Date  . HERNIA REPAIR     as a teen  . LAMINECTOMY     LS disc sx  . TONSILLECTOMY     Social History  Substance Use Topics  . Smoking status: Former Smoker    Quit date: 09/05/1987  . Smokeless tobacco: Former NeurosurgeonUser    Types: Chew  . Alcohol use No   Family History  Problem Relation Age of Onset  . Heart disease Mother     CAD  . Alcohol abuse Father   . Diabetes Father   . Heart disease Father     CAD  . Nephrolithiasis Father   . Obesity Brother   . Cancer Paternal Uncle     bladder CA   No Known Allergies Current Outpatient Prescriptions on File Prior to Visit  Medication Sig Dispense Refill  . aspirin-acetaminophen-caffeine (EXCEDRIN MIGRAINE) 250-250-65 MG per tablet Take 2 tablets by mouth every 6 (six) hours as needed for headache.    . lisinopril-hydrochlorothiazide (PRINZIDE,ZESTORETIC) 20-25 MG tablet Take 1 tablet by mouth daily. 90 tablet 3   No current facility-administered medications  on file prior to visit.       His wife massaged it    Patient Active Problem List   Diagnosis Date Noted  . Neck pain 09/12/2016  . Family history of early CAD 09/12/2016  . Trapezius muscle spasm 06/19/2016  . Knee pain, left 11/15/2015  . Disorder of patella 11/15/2015  . Stress reaction 09/14/2015  . Essential hypertension 08/09/2015  . Obesity 08/09/2015  . Right-sided chest wall pain 06/19/2012  . Snoring 03/22/2011  . Dysphagia 03/22/2011  . FATTY LIVER DISEASE 06/29/2010  . CHOLELITHIASIS 06/14/2010  . Hyperlipidemia 06/09/2010  . HYPERBILIRUBINEMIA 06/09/2010  . HEADACHE 06/09/2010  . TRANSAMINASES, SERUM, ELEVATED 06/09/2010  . PLANTAR FASCIITIS, LEFT 04/05/2010  . CARPAL TUNNEL SYNDROME 10/11/2007  . VERTIGO 09/10/2007   Past Medical History:  Diagnosis Date  . Carpal tunnel syndrome   .  DDD (degenerative disc disease)   . Elevated BP   . Fatty liver   . Gallstones   . Headache(784.0)    likely migraines  . Nosebleed    Hx of  . Plantar fasciitis   . Vertigo, intermittent    Past Surgical History:  Procedure Laterality Date  . HERNIA REPAIR     as a teen  . LAMINECTOMY     LS disc sx  . TONSILLECTOMY     Social History  Substance Use Topics  . Smoking status: Former Smoker    Quit date: 09/05/1987  . Smokeless tobacco: Former Neurosurgeon    Types: Chew  . Alcohol use No   Family History  Problem Relation Age of Onset  . Heart disease Mother     CAD  . Alcohol abuse Father   . Diabetes Father   . Heart disease Father     CAD  . Nephrolithiasis Father   . Obesity Brother   . Cancer Paternal Uncle     bladder CA   No Known Allergies Current Outpatient Prescriptions on File Prior to Visit  Medication Sig Dispense Refill  . aspirin-acetaminophen-caffeine (EXCEDRIN MIGRAINE) 250-250-65 MG per tablet Take 2 tablets by mouth every 6 (six) hours as needed for headache.    . lisinopril-hydrochlorothiazide (PRINZIDE,ZESTORETIC) 20-25 MG tablet Take 1 tablet by mouth daily. 90 tablet 3   No current facility-administered medications on file prior to visit.     Review of Systems Review of Systems  Constitutional: Negative for fever, appetite change, fatigue and unexpected weight change.  Eyes: Negative for pain and visual disturbance.  Respiratory: Negative for cough and shortness of breath.   Cardiovascular: Negative for cp or palpitations    Gastrointestinal: Negative for nausea, diarrhea and constipation.  Genitourinary: Negative for urgency and frequency.  Skin: Negative for pallor or rash   MSK pos for L neck pain  Neurological: Negative for weakness, light-headedness, numbness and headaches.  Hematological: Negative for adenopathy. Does not bruise/bleed easily.  Psychiatric/Behavioral: Negative for dysphoric mood. The patient is not nervous/anxious.           Objective:   Physical Exam  Constitutional: He appears well-developed and well-nourished. No distress.  obese and well appearing   HENT:  Head: Normocephalic and atraumatic.  Mouth/Throat: Oropharynx is clear and moist.  Eyes: Conjunctivae and EOM are normal. Pupils are equal, round, and reactive to light.  Neck: Normal range of motion. Neck supple. No JVD present. Carotid bruit is not present. No thyromegaly present.  Cardiovascular: Normal rate, regular rhythm, normal heart sounds and intact distal pulses.  Exam reveals no gallop.  Pulmonary/Chest: Effort normal and breath sounds normal. No respiratory distress. He has no wheezes. He has no rales.  No crackles  Abdominal: Soft. Bowel sounds are normal. He exhibits no distension, no abdominal bruit and no mass. There is no tenderness.  Musculoskeletal: He exhibits no edema.       Cervical back: He exhibits decreased range of motion, tenderness and spasm. He exhibits no bony tenderness, no edema and no deformity.  Tender over L cervical musculature and trapezius  Nl rom shoulder  Pain to fully tilt head each direction  No bony tenderness or step off in CS  No neuro changes  Lymphadenopathy:    He has no cervical adenopathy.  Neurological: He is alert. He has normal strength and normal reflexes. He displays no atrophy. No cranial nerve deficit or sensory deficit. He exhibits normal muscle tone. Coordination and gait normal.  Skin: Skin is warm and dry. No rash noted. No pallor.  Psychiatric: He has a normal mood and affect.          Assessment & Plan:   Problem List Items Addressed This Visit      Cardiovascular and Mediastinum   Essential hypertension    bp in fair control at this time  BP Readings from Last 1 Encounters:  09/12/16 116/72   No changes needed Disc lifstyle change with low sodium diet and exercise          Other   Family history of early CAD    With high triglycerides Obesity  No  symptoms Well controlled bp  Ref to cardiology- to disc risk factors and stress testing Disc diet/exercise      Relevant Orders   Ambulatory referral to Cardiology   Hyperlipidemia    Rev lipids in setting of fam hx of CAD Disc red carbs for trig Red trans fats for LDL Exercise for HDL   Handout given       Neck pain - Primary    Ongoing L side -rad to trap w/o neuro symptoms  Worse with tilting and rotation  Ongoing for months  Massage/heat /robaxin  Xray of cs today and plan from there      Relevant Orders   DG Cervical Spine Complete    Other Visit Diagnoses    Muscle spasm       Relevant Medications   methocarbamol (ROBAXIN) 500 MG tablet

## 2016-09-12 NOTE — Assessment & Plan Note (Signed)
With high triglycerides Obesity  No symptoms Well controlled bp  Ref to cardiology- to disc risk factors and stress testing Disc diet/exercise

## 2016-09-13 ENCOUNTER — Encounter: Payer: Self-pay | Admitting: Internal Medicine

## 2016-09-13 ENCOUNTER — Ambulatory Visit (INDEPENDENT_AMBULATORY_CARE_PROVIDER_SITE_OTHER): Payer: Commercial Managed Care - PPO | Admitting: Internal Medicine

## 2016-09-13 VITALS — BP 144/97 | HR 72 | Ht 65.0 in | Wt 201.8 lb

## 2016-09-13 DIAGNOSIS — Z8249 Family history of ischemic heart disease and other diseases of the circulatory system: Secondary | ICD-10-CM | POA: Diagnosis not present

## 2016-09-13 DIAGNOSIS — R0602 Shortness of breath: Secondary | ICD-10-CM | POA: Diagnosis not present

## 2016-09-13 DIAGNOSIS — E785 Hyperlipidemia, unspecified: Secondary | ICD-10-CM | POA: Insufficient documentation

## 2016-09-13 DIAGNOSIS — I10 Essential (primary) hypertension: Secondary | ICD-10-CM | POA: Diagnosis not present

## 2016-09-13 MED ORDER — FISH OIL 1000 MG PO CAPS: 2000.0000 mg | ORAL_CAPSULE | Freq: Every day | ORAL | 3 refills | Status: DC

## 2016-09-13 NOTE — Progress Notes (Signed)
New Outpatient Visit Date: 09/13/2016  Referring Provider: Judy Pimple, MD [address] [place], [place] [address]  Chief Complaint: Shortness of breath and family history of coronary artery disease  HPI:  Albert Tran is a 51 y.o. year-old male with history of hypertension, hypertriglyceridemia gallstones, migraine headaches, and arthritis, who has been referred by Dr. Milinda Antis for evaluation of shortness of breath in the setting of strong family history of premature coronary artery disease. The patient reports that he has been relatively healthy but noted shortness of breath while wrestling with his niece over the Christmas holiday. It took him about 30 minutes to catch his breath. 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 He denies chest pain, lightheadedness, edema, and orthopnea. Albert Tran endorses occasional brief vibratory sensations in his left shoulder, though it is not clearly feel like his heart is racing. He does not have associated symptoms like lightheadedness or chest pain. He has been compliant with his medications, including lisinopril/HCTZ. He notes that he has been under quite a bit of stress recently at work as well as at home, caring for his wife with multiple sclerosis. He checks his blood pressures at home on occasion, but feels that his monitor is unreliable due to wide fluctuations in readings.  The patient reports that several first-degree relatives have had coronary artery disease, including his father, brother, and sister. He reports undergoing a stress test in his 30s as part of his employment. He believes that study was normal. He has not undergone additional testing since that time.  --------------------------------------------------------------------------------------------------  Cardiovascular History & Procedures: Cardiovascular Problems:  Dyspnea on exertion  Family history of premature cardiovascular  disease  Risk Factors:  Hypertension, dyslipidemia, obesity, male gender, and family history of premature coronary artery disease  Cath/PCI:  None  CV Surgery:  None  EP Procedures and Devices:  None  Non-Invasive Evaluation(s):  None  Recent CV Pertinent Labs: Lab Results  Component Value Date   CHOL 212 (H) 12/24/2015   HDL 37.80 (L) 12/24/2015   LDLDIRECT 103.0 12/24/2015   TRIG (H) 12/24/2015    423.0 Triglyceride is over 400; calculations on Lipids are invalid.   CHOLHDL 6 12/24/2015   K 4.1 09/14/2015   K 3.9 07/09/2013   BUN 15 09/14/2015   BUN 23 (H) 07/09/2013   CREATININE 1.05 09/14/2015   CREATININE 1.17 07/09/2013    --------------------------------------------------------------------------------------------------  Past Medical History:  Diagnosis Date  . Carpal tunnel syndrome   . DDD (degenerative disc disease)   . Fatty liver   . Gallstones   . Headache(784.0)    likely migraines  . Hypertension   . Nosebleed    Hx of  . Plantar fasciitis   . Vertigo, intermittent     Past Surgical History:  Procedure Laterality Date  . HERNIA REPAIR     as a teen  . LAMINECTOMY     LS disc sx  . TONSILLECTOMY      Outpatient Encounter Prescriptions as of 09/13/2016  Medication Sig  . aspirin-acetaminophen-caffeine (EXCEDRIN MIGRAINE) 250-250-65 MG per tablet Take 2 tablets by mouth every 6 (six) hours as needed for headache.  . lisinopril-hydrochlorothiazide (PRINZIDE,ZESTORETIC) 20-25 MG tablet Take 1 tablet by mouth daily.  . methocarbamol (ROBAXIN) 500 MG tablet Take 1 tablet (500 mg total) by mouth every 8 (eight) hours as needed for muscle spasms.   No facility-administered encounter medications on file as of 09/13/2016.  Allergies: Patient has no known allergies.  Social History   Social History  . Marital status: Married    Spouse name: N/A  . Number of children: N/A  . Years of education: N/A   Occupational History  . Not on  file.   Social History Main Topics  . Smoking status: Former Smoker    Quit date: 09/05/1987  . Smokeless tobacco: Former NeurosurgeonUser    Types: Chew  . Alcohol use No  . Drug use: No  . Sexual activity: Not on file   Other Topics Concern  . Not on file   Social History Narrative  . No narrative on file    Family History  Problem Relation Age of Onset  . Heart disease Mother     CAD  . Alcohol abuse Father   . Diabetes Father   . Heart disease Father     CAD  . Nephrolithiasis Father   . Obesity Brother   . Heart attack Brother 48  . Cancer Paternal Uncle     bladder CA  . Heart disease Sister [id number]    CAD with 4 stents    Review of Systems: Intermittent migraine headaches, mostly related to changes in the weather. He also has left-sided neck/shoulder pain worsens when leaning his head to the right. This has been present for about 3 months. He denies trauma to the area. Otherwise, a 12-system review of systems was performed and was negative except as noted in the HPI.  --------------------------------------------------------------------------------------------------  Physical Exam: BP (!) 144/97 (BP Location: Right Arm, Patient Position: Sitting, Cuff Size: Normal)   Pulse 72   Ht 5\' 5"  (1.651 m)   Wt 201 lb 12 oz (91.5 kg)   BMI 33.57 kg/m   General:  Obese man, seated comfortably in the exam room. HEENT: No conjunctival pallor or scleral icterus.  Moist mucous membranes.  OP clear. Neck: Supple without lymphadenopathy, thyromegaly, JVD, or HJR.  No carotid bruit. Lungs: Normal work of breathing.  Clear to auscultation bilaterally without wheezes or crackles. Heart: Regular rate and rhythm without murmurs, rubs, or gallops.  Non-displaced PMI. Abd: Bowel sounds present.  Soft, NT/ND without hepatosplenomegaly Ext: No lower extremity edema.  Radial, PT, and DP pulses are 2+ bilaterally Skin: warm and dry without rash Neuro: CNIII-XII intact.  Strength and fine-touch  sensation intact in upper and lower extremities bilaterally. Psych: Normal mood and affect.  EKG:  Normal sinus rhythm. No significant abnormalities. No prior tracing available for comparison.  Lab Results  Component Value Date   WBC 10.9 (H) 09/14/2015   HGB 14.8 09/14/2015   HCT 43.3 09/14/2015   MCV 89.2 09/14/2015   PLT 253.0 09/14/2015    Lab Results  Component Value Date   NA 141 09/14/2015   K 4.1 09/14/2015   CL 102 09/14/2015   CO2 31 09/14/2015   BUN 15 09/14/2015   CREATININE 1.05 09/14/2015   GLUCOSE 86 09/14/2015   ALT 39 12/24/2015    Lab Results  Component Value Date   CHOL 212 (H) 12/24/2015   HDL 37.80 (L) 12/24/2015   LDLDIRECT 103.0 12/24/2015   TRIG (H) 12/24/2015    423.0 Triglyceride is over 400; calculations on Lipids are invalid.   CHOLHDL 6 12/24/2015    --------------------------------------------------------------------------------------------------  ASSESSMENT AND PLAN: Dyspnea on exertion This could be due to any number of factors including deconditioning, obesity, or cardiovascular disease. His EKG today is unremarkable. We have agreed to begin with an exercise tolerance  test. Based on these findings, further coronary evaluation may be needed, as well as transthoracic echocardiogram.  Family history of premature coronary artery disease The patient reports that his father, sister, and brother all had coronary artery disease at a young age. Sides this, the patient has several other cardiovascular risk factors, including hypertension, obesity, and dyslipidemia. As above, we will proceed with exercise tolerance test. If this is abnormal, we will consider coronary CTA versus cardiac catheterization. If the ETT is normal, we have agreed to consider coronary calcium score for further risk stratification. We also discussed lifestyle modifications at length, including diet and exercise.  Dyslipidemia Lipid panel in April was notable for significant  hypertriglyceridemia as well as low HDL. Direct LDL was reasonable at 103. We discussed lifestyle modifications. I have also recommended the patient take fish oil 2,000 mg twice a day. Based on the aforementioned tests, we will need to consider initiation of statin therapy as well.  Hypertension Blood pressure is mildly elevated today. We will not make any medication changes at this time, though I have asked the patient to continue monitoring his blood pressure at home and to notify us or his PCP if it is consistently above 140/90.  Follow-up: Return to clinic in 3 months.  Yvonne Kendall, MD 09/13/2016 5:48 PM

## 2016-09-13 NOTE — Patient Instructions (Addendum)
Medication Instructions:  Your physician has recommended you make the following change in your medication:  1- TAKE Fish Oil 2000 mg by mouth once a day.   Labwork: - None ordered.   Testing/Procedures: Your physician has requested that you have an exercise tolerance test. For further information please visit [email or web address] Please also follow instruction sheet, as given.   Follow-Up: Your physician wants you to follow-up in: 3 MONTHS WITH DR END.  You will receive a reminder letter in the mail two months in advance. If you don't receive a letter, please call our office to schedule the follow-up appointment.     If you need a refill on your cardiac medications before your next appointment, please call your pharmacy.   Exercise Stress Electrocardiogram An exercise stress electrocardiogram is a test to check how blood flows to your heart. It is done to find areas of poor blood flow. You will need to walk on a treadmill for this test. The electrocardiogram will record your heartbeat when you are at rest and when you are exercising. What happens before the procedure?  Do not have drinks with caffeine or foods with caffeine for 24 hours before the test, or as told by your doctor. This includes coffee, tea (even decaf tea), sodas, chocolate, and cocoa.  Follow your doctor's instructions about eating and drinking before the test.  Ask your doctor what medicines you should or should not take before the test. Take your medicines with water unless told by your doctor not to.  If you use an inhaler, bring it with you to the test.  Bring a snack to eat after the test.  Do not  smoke for 4 hours before the test.  Do not put lotions, powders, creams, or oils on your chest before the test.  Wear comfortable shoes and clothing. What happens during the procedure?  You will have patches put on your chest. Small areas of your chest may need to be shaved. Wires will be connected to the  patches.  Your heart rate will be watched while you are resting and while you are exercising.  You will walk on the treadmill. The treadmill will slowly get faster to raise your heart rate.  The test will take about 1-2 hours. What happens after the procedure?  Your heart rate and blood pressure will be watched after the test.  You may return to your normal diet, activities, and medicines or as told by your doctor. This information is not intended to replace advice given to you by your health care provider. Make sure you discuss any questions you have with your health care provider. Document Released: 02/07/2008 Document Revised: 04/19/2016 Document Reviewed: 04/28/2013 Elsevier Interactive Patient Education  2017 [employer]

## 2016-10-17 ENCOUNTER — Encounter: Payer: Self-pay | Admitting: Internal Medicine

## 2016-12-19 ENCOUNTER — Other Ambulatory Visit: Payer: Self-pay | Admitting: Family Medicine

## 2016-12-20 NOTE — Telephone Encounter (Signed)
His bp was a bit high at the cardiology office so do f/u with me this summer for a HTN visit (we will do labs that day) Refill meds until then Thanks

## 2016-12-20 NOTE — Telephone Encounter (Signed)
Pt has seen Dr. Milinda Antis for a few acute appts but no recent f/u or CPE, please advise

## 2016-12-21 NOTE — Telephone Encounter (Signed)
appt scheduled sooner due to pt having some other issues and med refilled pt wanted me to decline the meloxicam because he isn't using it anymore it may have been on a auto refill, done

## 2016-12-26 ENCOUNTER — Encounter: Payer: Self-pay | Admitting: Primary Care

## 2016-12-26 ENCOUNTER — Ambulatory Visit (INDEPENDENT_AMBULATORY_CARE_PROVIDER_SITE_OTHER): Payer: Commercial Managed Care - PPO | Admitting: Primary Care

## 2016-12-26 VITALS — BP 164/100 | HR 66 | Temp 98.4°F | Ht 64.0 in | Wt 208.0 lb

## 2016-12-26 DIAGNOSIS — M542 Cervicalgia: Secondary | ICD-10-CM | POA: Diagnosis not present

## 2016-12-26 DIAGNOSIS — G8929 Other chronic pain: Secondary | ICD-10-CM | POA: Diagnosis not present

## 2016-12-26 DIAGNOSIS — M62838 Other muscle spasm: Secondary | ICD-10-CM

## 2016-12-26 MED ORDER — KETOROLAC TROMETHAMINE 60 MG/2ML IM SOLN
60.0000 mg | Freq: Once | INTRAMUSCULAR | Status: AC
  Administered 2016-12-26: 60 mg via INTRAMUSCULAR

## 2016-12-26 MED ORDER — NAPROXEN 500 MG PO TABS: 500.0000 mg | ORAL_TABLET | Freq: Two times a day (BID) | ORAL | 0 refills | Status: DC

## 2016-12-26 MED ORDER — CYCLOBENZAPRINE HCL 10 MG PO TABS: 10.0000 mg | ORAL_TABLET | Freq: Three times a day (TID) | ORAL | 0 refills | Status: DC | PRN

## 2016-12-26 NOTE — Progress Notes (Signed)
Pre visit review using our clinic review tool, if applicable. No additional management support is needed unless otherwise documented below in the visit note. 

## 2016-12-26 NOTE — Addendum Note (Signed)
Addended by: Tawnya Crook on: 12/26/2016 09:26 AM   Modules accepted: Orders

## 2016-12-26 NOTE — Patient Instructions (Addendum)
You may take cyclobenzaprine 10 mg tablets for muscle spasms. Take 1 tablet by mouth three times daily as needed. Caution as this may cause drowsiness.   Start naproxen 500 mg tablets twice daily with food as needed for muscle spasm/neck pain. Do not start this medication until bedtime tonight.  Stop by the front desk and speak with either Shirlee Limerick or Zella Ball regarding your referral to orthopedics.  It was a pleasure to see you today!

## 2016-12-26 NOTE — Assessment & Plan Note (Signed)
Ongoing, now with radiation to left shoulder. Suspect neck to be main cause. Refuses PT. Will send to orthopedics for further evaluation given duration of symptoms without improvement with conservative treatment. Rx for Flexeril and Naproxen sent to pharmacy. IM Toradol provided in office today. Discussed stretching and exercises. Will likely need MRI.

## 2016-12-26 NOTE — Progress Notes (Signed)
Subjective:    Patient ID: Albert Tran, male    DOB: May 14, 1966, 51 y.o.   MRN: [id number]  HPI  Albert Tran is a 51 year old male with a history of muscle spasms and arthralgias who presents today with a chief complaint of neck and shoulder pain. His pain is located to the posterior mid to lower neck that has been present for the past 6 months. Most recently he's experienced radiation to left posterior shoulder that has become increasingly uncomfortable.   He was last seen in our office in early January 2018 for complaints of left sided neck pain. He underwent xray and it was recommended he complete physical therapy. He went once and never returned again as he didn't believe they could help. He has a decrease in ROM to his neck, denies decrease in ROM to his left shoulder. He does experience intermittent numbness/tingling to the left neck. He's taken methocarbamol, Aleve, and Aspercreme with lidocaine without much improvement.   Review of Systems  Musculoskeletal: Positive for arthralgias, myalgias, neck pain and neck stiffness.  Skin: Negative for color change.  Neurological: Positive for numbness. Negative for weakness.       Past Medical History:  Diagnosis Date  . Carpal tunnel syndrome   . DDD (degenerative disc disease)   . Fatty liver   . Gallstones   . Headache(784.0)    likely migraines  . Hypertension   . Nosebleed    Hx of  . Plantar fasciitis   . Vertigo, intermittent      Social History   Social History  . Marital status: Married    Spouse name: N/A  . Number of children: N/A  . Years of education: N/A   Occupational History  . Not on file.   Social History Main Topics  . Smoking status: Former Smoker    Quit date: 09/05/1987  . Smokeless tobacco: Former Neurosurgeon    Types: Chew  . Alcohol use No  . Drug use: No  . Sexual activity: Not on file   Other Topics Concern  . Not on file   Social History Narrative  . No narrative on file    Past  Surgical History:  Procedure Laterality Date  . HERNIA REPAIR     as a teen  . LAMINECTOMY     LS disc sx  . TONSILLECTOMY      Family History  Problem Relation Age of Onset  . Heart disease Mother     CAD  . Alcohol abuse Father   . Diabetes Father   . Heart disease Father     CAD  . Nephrolithiasis Father   . Obesity Brother   . Heart attack Brother 48  . Cancer Paternal Uncle     bladder CA  . Heart disease Sister 69    CAD with 4 stents    No Known Allergies  Current Outpatient Prescriptions on File Prior to Visit  Medication Sig Dispense Refill  . aspirin-acetaminophen-caffeine (EXCEDRIN MIGRAINE) 250-250-65 MG per tablet Take 2 tablets by mouth every 6 (six) hours as needed for headache.    . lisinopril-hydrochlorothiazide (PRINZIDE,ZESTORETIC) 20-25 MG tablet TAKE 1 TABLET BY MOUTH  DAILY (Patient not taking: Reported on 12/26/2016) 90 tablet 0  . Omega-3 Fatty Acids (FISH OIL) 1000 MG CAPS Take 2 capsules (2,000 mg total) by mouth daily. (Patient not taking: Reported on 12/26/2016) 180 capsule 3   No current facility-administered medications on file prior to visit.  BP (!) 164/100   Pulse 66   Temp 98.4 F (36.9 C) (Oral)   Ht  (1.626 m)   Wt 208 lb (94.3 kg)   SpO2 97%   BMI 35.70 kg/m    Objective:   Physical Exam  Constitutional: He appears well-nourished.  Neck: Neck supple. Muscular tenderness present. No spinous process tenderness present. Decreased range of motion present.    Muscle tension  Cardiovascular: Normal rate and regular rhythm.   Pulmonary/Chest: Effort normal and breath sounds normal.  Musculoskeletal:       Left shoulder: He exhibits normal range of motion, no tenderness, no bony tenderness, no pain and normal strength.  Skin: Skin is warm and dry.          Assessment & Plan:

## 2016-12-27 ENCOUNTER — Ambulatory Visit: Payer: Commercial Managed Care - PPO | Admitting: Family Medicine

## 2017-01-01 ENCOUNTER — Ambulatory Visit: Payer: Commercial Managed Care - PPO | Admitting: Family Medicine

## 2017-01-03 ENCOUNTER — Other Ambulatory Visit: Payer: Self-pay

## 2017-01-03 DIAGNOSIS — R0609 Other forms of dyspnea: Principal | ICD-10-CM

## 2017-01-03 DIAGNOSIS — R0600 Dyspnea, unspecified: Secondary | ICD-10-CM

## 2017-01-04 ENCOUNTER — Telehealth: Payer: Self-pay | Admitting: *Deleted

## 2017-01-04 ENCOUNTER — Ambulatory Visit: Payer: Commercial Managed Care - PPO

## 2017-01-04 MED ORDER — AMLODIPINE BESYLATE 5 MG PO TABS: 5.0000 mg | ORAL_TABLET | Freq: Every day | ORAL | 3 refills | Status: DC

## 2017-01-04 NOTE — Telephone Encounter (Signed)
Patient here for Treadmill stress test today. His initial BP was 163/110, HR 83. Second BP 158/109, HR 80. Denies chest pain, SOB, palpitations, nausea or sweating. He has pain in the left part of his neck from a recent neck injury in which he is due for an MRI in a few weeks.  BP's shown to Alber[place]on[place]yan [place], [place]. He reviewed patient record and ordered Amlodipine 5 mg by mouth once a day. If BP still greater than 150/90 on Saturday, 01/06/17, then increase Amlodipine to 10 mg daily. Follow up with Dr End next week.  Patient notified of plan, AVS given and he verbalized understanding. RX sent to verified pharmacy. Appt with Dr End scheduled for 01/10/17 at 1400.

## 2017-01-04 NOTE — Patient Instructions (Signed)
Medication Instructions:  Your physician has recommended you make the following change in your medication:  1- START Amlodipine 5mg  (1 tablet) by mouth once a day. - If BP is still greater than 150/90 on Saturday, 01/06/17, then increase Amlodipine to 10 mg by mouth once a day.  Please monitor your blood pressure and heartrate at home. Your physician has requested that you regularly monitor and record your blood pressure readings at home. Please use the same machine at the same time of day to check your readings and record them to bring to your follow-up visit.    Labwork: none  Testing/Procedures: none  Follow-Up: Your physician recommends that you schedule a follow-up appointment in: NEXT WEEK WITH DR END.   If you need a refill on your cardiac medications before your next appointment, please call your pharmacy.

## 2017-01-10 ENCOUNTER — Ambulatory Visit (INDEPENDENT_AMBULATORY_CARE_PROVIDER_SITE_OTHER): Payer: Commercial Managed Care - PPO | Admitting: Internal Medicine

## 2017-01-10 ENCOUNTER — Encounter: Payer: Self-pay | Admitting: Internal Medicine

## 2017-01-10 VITALS — BP 130/92 | HR 83 | Ht 65.0 in | Wt 202.2 lb

## 2017-01-10 DIAGNOSIS — E782 Mixed hyperlipidemia: Secondary | ICD-10-CM | POA: Diagnosis not present

## 2017-01-10 DIAGNOSIS — I10 Essential (primary) hypertension: Secondary | ICD-10-CM

## 2017-01-10 DIAGNOSIS — R0609 Other forms of dyspnea: Secondary | ICD-10-CM

## 2017-01-10 DIAGNOSIS — Z8249 Family history of ischemic heart disease and other diseases of the circulatory system: Secondary | ICD-10-CM

## 2017-01-10 DIAGNOSIS — M542 Cervicalgia: Secondary | ICD-10-CM | POA: Diagnosis not present

## 2017-01-10 DIAGNOSIS — R0600 Dyspnea, unspecified: Secondary | ICD-10-CM

## 2017-01-10 NOTE — Patient Instructions (Addendum)
Medication Instructions:  Your physician recommends that you continue on your current medications as directed. Please refer to the Current Medication list given to you today.   Labwork: none  Testing/Procedures: Your physician has requested that you have a lexiscan myoview. For further information please visit [email or web address]. Please follow instruction sheet, as given.   [facility] LEXISCAN MYOVIEW  Your caregiver has ordered a Stress Test with nuclear imaging. The purpose of this test is to evaluate the blood supply to your heart muscle. This procedure is referred to as a "Non-Invasive Stress Test." This is because other than having an IV started in your vein, nothing is inserted or "invades" your body. Cardiac stress tests are done to find areas of poor blood flow to the heart by determining the extent of coronary artery disease (CAD). Some patients exercise on a treadmill, which naturally increases the blood flow to your heart, while others who are  unable to walk on a treadmill due to physical limitations have a pharmacologic/chemical stress agent called Lexiscan . This medicine will mimic walking on a treadmill by temporarily increasing your coronary blood flow.   Please note: these test may take anywhere between 2-4 hours to complete  [facility]  THE VOLUNTEERS AT THE FIRST DESK WILL DIRECT YOU WHERE TO GO  Date of Procedure:______05/14/18_______________  Arrival Time for Procedure:_______07:45 am_________  Instructions regarding medication:    YOU MAY TAKE YOUR USUAL MORNING MEDICATIONS WITH A SMALL SIP OF WATER.   PLEASE NOTIFY THE OFFICE AT LEAST 24 HOURS IN ADVANCE IF YOU ARE UNABLE TO KEEP YOUR APPOINTMENT.  [phone] AND  PLEASE NOTIFY NUCLEAR MEDICINE AT [facility] AT LEAST 24 HOURS IN ADVANCE IF YOU ARE UNABLE TO KEEP YOUR APPOINTMENT. [phone]  How to prepare for your Myoview test:  1. Do not eat or drink after midnight 2. No  caffeine for 24 hours prior to test 3. No smoking 24 hours prior to test. 4. Your medication may be taken with water.  If your doctor stopped a medication because of this test, do not take that medication. 5. Ladies, please do not wear dresses.  Skirts or pants are appropriate. Please wear a short sleeve shirt. 6. No perfume, cologne or lotion. 7. Wear comfortable walking shoes. No heels!      Follow-Up: Your physician recommends that you schedule a follow-up appointment in: 6 WEEKS WITH DR END OR APP.   If you need a refill on your cardiac medications before your next appointment, please call your pharmacy.  Cardiac Nuclear Scan A cardiac nuclear scan is a test that measures blood flow to the heart when a person is resting and when he or she is exercising. The test looks for problems such as:  Not enough blood reaching a portion of the heart.  The heart muscle not working normally. You may need this test if:  You have heart disease.  You have had abnormal lab results.  You have had heart surgery or angioplasty.  You have chest pain.  You have shortness of breath. In this test, a radioactive dye (tracer) is injected into your bloodstream. After the tracer has traveled to your heart, an imaging device is used to measure how much of the tracer is absorbed by or distributed to various areas of your heart. This procedure is usually done at a hospital and takes 2-4 hours. Tell a health care provider about:  Any allergies you have.  All medicines you are taking, including vitamins, herbs,  eye drops, creams, and over-the-counter medicines.  Any problems you or family members have had with the use of anesthetic medicines.  Any blood disorders you have.  Any surgeries you have had.  Any medical conditions you have.  Whether you are pregnant or may be pregnant. What are the risks? Generally, this is a safe procedure. However, problems may occur, including:  Serious chest pain  and heart attack. This is only a risk if the stress portion of the test is done.  Rapid heartbeat.  Sensation of warmth in your chest. This usually passes quickly. What happens before the procedure?  Ask your health care provider about changing or stopping your regular medicines. This is especially important if you are taking diabetes medicines or blood thinners.  Remove your jewelry on the day of the procedure. What happens during the procedure?  An IV tube will be inserted into one of your veins.  Your health care provider will inject a small amount of radioactive tracer through the tube.  You will wait for 20-40 minutes while the tracer travels through your bloodstream.  Your heart activity will be monitored with an electrocardiogram (ECG).  You will lie down on an exam table.  Images of your heart will be taken for about 15-20 minutes.  You may be asked to exercise on a treadmill or stationary bike. While you exercise, your heart's activity will be monitored with an ECG, and your blood pressure will be checked. If you are unable to exercise, you may be given a medicine to increase blood flow to parts of your heart.  When blood flow to your heart has peaked, a tracer will again be injected through the IV tube.  After 20-40 minutes, you will get back on the exam table and have more images taken of your heart.  When the procedure is over, your IV tube will be removed. The procedure may vary among health care providers and hospitals. Depending on the type of tracer used, scans may need to be repeated 3-4 hours later. What happens after the procedure?  Unless your health care provider tells you otherwise, you may return to your normal schedule, including diet, activities, and medicines.  Unless your health care provider tells you otherwise, you may increase your fluid intake. This will help flush the contrast dye from your body. Drink enough fluid to keep your urine clear or pale  yellow.  It is up to you to get your test results. Ask your health care provider, or the department that is doing the test, when your results will be ready. Summary  A cardiac nuclear scan measures the blood flow to the heart when a person is resting and when he or she is exercising.  You may need this test if you are at risk for heart disease.  Tell your health care provider if you are pregnant.  Unless your health care provider tells you otherwise, increase your fluid intake. This will help flush the contrast dye from your body. Drink enough fluid to keep your urine clear or pale yellow. This information is not intended to replace advice given to you by your health care provider. Make sure you discuss any questions you have with your health care provider. Document Released: 09/15/2004 Document Revised: 08/23/2016 Document Reviewed: 07/30/2013 Elsevier Interactive Patient Education  2017 [employer].

## 2017-01-10 NOTE — Progress Notes (Signed)
Follow-up Outpatient Visit Date: 01/10/2017  Primary Care Provider: Judy Pimple, MD [address] [place] [address]  Chief Complaint: Shortness of breath and elevated blood pressure  HPI:  Albert Tran is a 51 y.o. year-old male with history of hypertension, hypertriglyceridemia, gallstones, migraine headaches, and arthritis, who presents for follow-up of shortness of breath and elevated blood pressure. I saw him on 09/13/16, which time we agreed to obtain an exercise tolerance test for evaluation of shortness of breath and a family history of premature coronary artery disease. The test was delayed several times, the Albert Tran ultimately presented last week for the ETT. At the time, his blood pressure was significantly elevated at 163/110. Albert Tran reports that he had not been taking his blood pressure medications on a regular basis, including on that day. He was advised to restart his home medications. He was also started on amlodipine 5 mg daily. Stress test was deferred.  Today, Albert Tran reports feeling relatively well. His only complaint is of neck pain that he attributes to a pulled muscle. He also has tingling and a "funny feeling" in his left shoulder that is brought on by turning his head to the left. He denies chest pain, palpitations, lightheadedness, and orthopnea. Albert Tran notes some shortness of breath with vigorous activity, similar to his last visit. He has been taking his medications regularly over the last week. He denies side effects.  --------------------------------------------------------------------------------------------------  Cardiovascular History & Procedures: Cardiovascular Problems:  Dyspnea on exertion  Family history of premature cardiovascular disease  Risk Factors:  Hypertension, dyslipidemia, obesity, male gender, and family history of premature coronary artery disease  Cath/PCI:  None  CV Surgery:  None  EP  Procedures and Devices:  None  Non-Invasive Evaluation(s):  None  Recent CV Pertinent Labs: Lab Results  Component Value Date   CHOL 212 (H) 12/24/2015   HDL 37.80 (L) 12/24/2015   LDLDIRECT 103.0 12/24/2015   TRIG (H) 12/24/2015    423.0 Triglyceride is over 400; calculations on Lipids are invalid.   CHOLHDL 6 12/24/2015   K 4.1 09/14/2015   K 3.9 07/09/2013   BUN 15 09/14/2015   BUN 23 (H) 07/09/2013   CREATININE 1.05 09/14/2015   CREATININE 1.17 07/09/2013    Past medical and surgical history were reviewed and updated in EPIC.  Outpatient Encounter Prescriptions as of 01/10/2017  Medication Sig  . amLODipine (NORVASC) 5 MG tablet Take 1 tablet (5 mg total) by mouth daily. If BP is still greater than 150/90 on Saturday, 5/5, increase Amlodipine to 10 mg by mouth daily.  Marland Kitchen aspirin-acetaminophen-caffeine (EXCEDRIN MIGRAINE) 250-250-65 MG per tablet Take 2 tablets by mouth every 6 (six) hours as needed for headache.  . cyclobenzaprine (FLEXERIL) 10 MG tablet Take 1 tablet (10 mg total) by mouth 3 (three) times daily as needed for muscle spasms.  Marland Kitchen lisinopril-hydrochlorothiazide (PRINZIDE,ZESTORETIC) 20-25 MG tablet TAKE 1 TABLET BY MOUTH  DAILY  . naproxen (NAPROSYN) 500 MG tablet Take 1 tablet (500 mg total) by mouth 2 (two) times daily with a meal.  . Omega-3 Fatty Acids (FISH OIL) 1000 MG CAPS Take 2 capsules (2,000 mg total) by mouth daily.   No facility-administered encounter medications on file as of 01/10/2017.     Allergies: Patient has no known allergies.  Social History   Social History  . Marital status: Married    Spouse name: N/A  . Number of children: N/A  . Years of education: N/A  Occupational History  . Not on file.   Social History Main Topics  . Smoking status: Former Smoker    Quit date: 09/05/1987  . Smokeless tobacco: Former NeurosurgeonUser    Types: Chew  . Alcohol use No  . Drug use: No  . Sexual activity: Not on file   Other Topics Concern  . Not  on file   Social History Narrative  . No narrative on file    Family History  Problem Relation Age of Onset  . Heart disease Mother     CAD  . Alcohol abuse Father   . Diabetes Father   . Heart disease Father     CAD  . Nephrolithiasis Father   . Obesity Brother   . Heart attack Brother 48  . Cancer Paternal Uncle     bladder CA  . Heart disease Sister [id number]    CAD with 4 stents    Review of Systems: A 12-system review of systems was performed and was negative except as noted in the HPI.  --------------------------------------------------------------------------------------------------  Physical Exam: BP (!) 130/92 (BP Location: Left Arm, Patient Position: Sitting, Cuff Size: Normal)   Pulse 83   Ht 5\' 5"  (1.651 m)   Wt 202 lb 4 oz (91.7 kg)   BMI 33.66 kg/m   General:  Overweight man, seated comfortably in the exam room. He is somewhat somnolent and withdrawn. HEENT: No conjunctival pallor or scleral icterus.  Moist mucous membranes.  OP clear. Neck: Supple without lymphadenopathy, thyromegaly, JVD, or HJR.  No carotid bruit. Lungs: Normal work of breathing.  Clear to auscultation bilaterally without wheezes or crackles. Heart: Regular rate and rhythm without murmurs, rubs, or gallops.  Non-displaced PMI. Abd: Bowel sounds present.  Soft, NT/ND without hepatosplenomegaly Ext: No lower extremity edema.  Radial, PT, and DP pulses are 2+ bilaterally. Skin: warm and dry without rash  Lab Results  Component Value Date   WBC 10.9 (H) 09/14/2015   HGB 14.8 09/14/2015   HCT 43.3 09/14/2015   MCV 89.2 09/14/2015   PLT 253.0 09/14/2015    Lab Results  Component Value Date   NA 141 09/14/2015   K 4.1 09/14/2015   CL 102 09/14/2015   CO2 31 09/14/2015   BUN 15 09/14/2015   CREATININE 1.05 09/14/2015   GLUCOSE 86 09/14/2015   ALT 39 12/24/2015    Lab Results  Component Value Date   CHOL 212 (H) 12/24/2015   HDL 37.80 (L) 12/24/2015   LDLDIRECT 103.0 12/24/2015     TRIG (H) 12/24/2015    423.0 Triglyceride is over 400; calculations on Lipids are invalid.   CHOLHDL 6 12/24/2015    --------------------------------------------------------------------------------------------------  ASSESSMENT AND PLAN: Dyspnea on exertion and family history of premature coronary artery disease Symptoms unchanged since prior visit. Exercise tolerance test was not been performed last week due to uncontrolled hypertension in the setting of medication noncompliance. Blood pressure is better controlled today. We discussed proceeding with ETT versus alternative ischemia evaluation. Given his neck pain, I am concerned about Albert Tran's ability to adequately exercise on the treadmill. We have therefore agreed to obtain a pharmacologic myocardial perfusion stress test instead. We will discuss the need for further testing (catheterization, coronary calcium score, and/or echocardiogram) after completion of the stress test.  Hypertension Blood pressure mildly elevated today but much improved from prior visit. I asked him to continue his current medications as well as to avoid salt.  Hyperlipidemia Lipid panel last year was notable for marked  hypertriglyceridemia and low HDL. He is currently on fish oil. I will defer further management to Dr. Milinda Antis pending Albert Tran stress test.   Follow-up: Return to clinic in 6 weeks.  Yvonne Kendall, MD 01/11/2017 9:18 PM

## 2017-01-11 ENCOUNTER — Encounter: Payer: Self-pay | Admitting: Internal Medicine

## 2017-01-12 ENCOUNTER — Telehealth: Payer: Self-pay | Admitting: Internal Medicine

## 2017-01-12 NOTE — Telephone Encounter (Signed)
Spoke with patient and reviewed stress test instructions and appointment time for Monday. He verbalized understanding with no further questions at this time.

## 2017-01-15 ENCOUNTER — Encounter
Admission: RE | Admit: 2017-01-15 | Discharge: 2017-01-15 | Disposition: A | Discharge status: Home / Self Care | Payer: Commercial Managed Care - PPO | Source: Ambulatory Visit | Attending: Internal Medicine | Admitting: Internal Medicine

## 2017-01-15 DIAGNOSIS — R0609 Other forms of dyspnea: Secondary | ICD-10-CM | POA: Diagnosis not present

## 2017-01-15 DIAGNOSIS — M542 Cervicalgia: Secondary | ICD-10-CM | POA: Diagnosis not present

## 2017-01-15 DIAGNOSIS — R0600 Dyspnea, unspecified: Secondary | ICD-10-CM

## 2017-01-15 LAB — NM MYOCAR MULTI W/SPECT W/WALL MOTION / EF
CHL CUP NUCLEAR SRS: 4
CHL CUP NUCLEAR SSS: 0
CHL CUP RESTING HR STRESS: 65 {beats}/min
CSEPHR: 56 %
LV dias vol: 117 mL (ref 62–150)
LVSYSVOL: 47 mL
Peak HR: 96 {beats}/min
SDS: 0
TID: 1.09

## 2017-01-15 MED ORDER — TECHNETIUM TC 99M TETROFOSMIN IV KIT
13.3600 | PACK | Freq: Once | INTRAVENOUS | Status: AC | PRN
  Administered 2017-01-15: 13.36 via INTRAVENOUS

## 2017-01-15 MED ORDER — TECHNETIUM TC 99M TETROFOSMIN IV KIT
33.0000 | PACK | Freq: Once | INTRAVENOUS | Status: AC | PRN
  Administered 2017-01-15: 33 via INTRAVENOUS

## 2017-01-15 MED ORDER — REGADENOSON 0.4 MG/5ML IV SOLN
0.4000 mg | Freq: Once | INTRAVENOUS | Status: AC
  Administered 2017-01-15: 0.4 mg via INTRAVENOUS

## 2017-01-16 ENCOUNTER — Other Ambulatory Visit: Payer: Self-pay | Admitting: *Deleted

## 2017-01-16 DIAGNOSIS — R0602 Shortness of breath: Secondary | ICD-10-CM

## 2017-01-24 DIAGNOSIS — M5010 Cervical disc disorder with radiculopathy, unspecified cervical region: Secondary | ICD-10-CM | POA: Diagnosis not present

## 2017-01-30 ENCOUNTER — Other Ambulatory Visit: Payer: Self-pay | Admitting: Orthopedic Surgery

## 2017-01-30 DIAGNOSIS — M5010 Cervical disc disorder with radiculopathy, unspecified cervical region: Secondary | ICD-10-CM

## 2017-01-31 ENCOUNTER — Telehealth: Payer: Self-pay | Admitting: *Deleted

## 2017-01-31 NOTE — Telephone Encounter (Signed)
End, Cristal Deerhristopher, MD  Stann Mainlandlark, Jennifer O, RN        Hi Victorino DikeJennifer,   That is fine. Hopefully he will get his neck issues addressed in the near future. If surgical intervention is needed for his neck pain, we will need to obtain the echo before clearing him for surgery. Thanks.   Thayer Ohmhris   Previous Messages    ----- Message -----  From: Stann Mainlandlark, Jennifer O, RN  Sent: 01/22/2017  8:06 AM  To: Yvonne Kendallhristopher End, MD  Subject: FW: needs echo                  Good Morning, Dr End,   This patient has not scheduled his echo at this time. He is in the process of resolving his neck issues at this time and said he would call back to schedule at a later date. His myoview did not show any evidence of significant blockages, but an echo was recommended to better evaluate.   Thanks,  DIRECTVJennifer

## 2017-02-06 ENCOUNTER — Ambulatory Visit
Admission: RE | Admit: 2017-02-06 | Discharge: 2017-02-06 | Disposition: A | Discharge status: Home / Self Care | Payer: Commercial Managed Care - PPO | Source: Ambulatory Visit | Attending: Orthopedic Surgery | Admitting: Orthopedic Surgery

## 2017-02-06 DIAGNOSIS — M4802 Spinal stenosis, cervical region: Secondary | ICD-10-CM | POA: Diagnosis not present

## 2017-02-06 DIAGNOSIS — M5031 Other cervical disc degeneration,  high cervical region: Secondary | ICD-10-CM | POA: Insufficient documentation

## 2017-02-06 DIAGNOSIS — M5010 Cervical disc disorder with radiculopathy, unspecified cervical region: Secondary | ICD-10-CM | POA: Diagnosis present

## 2017-02-22 ENCOUNTER — Ambulatory Visit: Payer: Commercial Managed Care - PPO | Admitting: Nurse Practitioner

## 2017-02-22 DIAGNOSIS — M5412 Radiculopathy, cervical region: Secondary | ICD-10-CM | POA: Diagnosis not present

## 2017-03-05 DIAGNOSIS — M5412 Radiculopathy, cervical region: Secondary | ICD-10-CM | POA: Diagnosis not present

## 2017-03-05 DIAGNOSIS — R531 Weakness: Secondary | ICD-10-CM | POA: Diagnosis not present

## 2017-03-09 DIAGNOSIS — M542 Cervicalgia: Secondary | ICD-10-CM | POA: Diagnosis not present

## 2017-03-09 DIAGNOSIS — R531 Weakness: Secondary | ICD-10-CM | POA: Diagnosis not present

## 2017-03-11 ENCOUNTER — Other Ambulatory Visit: Payer: Self-pay | Admitting: Family Medicine

## 2017-03-12 NOTE — Telephone Encounter (Signed)
Pt had an acute appt in Jan 2018 but he has cancelled his last two med refill appts., please advise

## 2017-03-12 NOTE — Telephone Encounter (Signed)
Please schedule f/u and refill until then  

## 2017-03-13 NOTE — Telephone Encounter (Signed)
appt scheduled and med refilled 

## 2017-03-16 DIAGNOSIS — M542 Cervicalgia: Secondary | ICD-10-CM | POA: Diagnosis not present

## 2017-03-16 DIAGNOSIS — R531 Weakness: Secondary | ICD-10-CM | POA: Diagnosis not present

## 2017-05-08 ENCOUNTER — Ambulatory Visit (INDEPENDENT_AMBULATORY_CARE_PROVIDER_SITE_OTHER): Payer: Commercial Managed Care - PPO | Admitting: Family Medicine

## 2017-05-08 ENCOUNTER — Encounter: Payer: Self-pay | Admitting: Family Medicine

## 2017-05-08 VITALS — BP 148/86 | HR 69 | Temp 98.4°F | Ht 65.0 in | Wt 204.5 lb

## 2017-05-08 DIAGNOSIS — E782 Mixed hyperlipidemia: Secondary | ICD-10-CM | POA: Diagnosis not present

## 2017-05-08 DIAGNOSIS — E6609 Other obesity due to excess calories: Secondary | ICD-10-CM | POA: Diagnosis not present

## 2017-05-08 DIAGNOSIS — I10 Essential (primary) hypertension: Secondary | ICD-10-CM | POA: Diagnosis not present

## 2017-05-08 DIAGNOSIS — F430 Acute stress reaction: Secondary | ICD-10-CM

## 2017-05-08 DIAGNOSIS — Z6834 Body mass index (BMI) 34.0-34.9, adult: Secondary | ICD-10-CM | POA: Diagnosis not present

## 2017-05-08 LAB — CBC WITH DIFFERENTIAL/PLATELET
BASOS PCT: 0.9 % (ref 0.0–3.0)
Basophils Absolute: 0.1 10*3/uL (ref 0.0–0.1)
EOS ABS: 0.3 10*3/uL (ref 0.0–0.7)
EOS PCT: 3.5 % (ref 0.0–5.0)
HCT: 41.3 % (ref 39.0–52.0)
HEMOGLOBIN: 14.5 g/dL (ref 13.0–17.0)
LYMPHS ABS: 2.6 10*3/uL (ref 0.7–4.0)
Lymphocytes Relative: 27.4 % (ref 12.0–46.0)
MCHC: 35 g/dL (ref 30.0–36.0)
MCV: 89.9 fl (ref 78.0–100.0)
MONO ABS: 0.8 10*3/uL (ref 0.1–1.0)
Monocytes Relative: 8.9 % (ref 3.0–12.0)
NEUTROS PCT: 59.3 % (ref 43.0–77.0)
Neutro Abs: 5.6 10*3/uL (ref 1.4–7.7)
Platelets: 263 10*3/uL (ref 150.0–400.0)
RBC: 4.59 Mil/uL (ref 4.22–5.81)
RDW: 12.7 % (ref 11.5–15.5)
WBC: 9.4 10*3/uL (ref 4.0–10.5)

## 2017-05-08 LAB — COMPREHENSIVE METABOLIC PANEL
ALT: 29 U/L (ref 0–53)
AST: 24 U/L (ref 0–37)
Albumin: 4.3 g/dL (ref 3.5–5.2)
Alkaline Phosphatase: 62 U/L (ref 39–117)
BUN: 14 mg/dL (ref 6–23)
CHLORIDE: 103 meq/L (ref 96–112)
CO2: 28 mEq/L (ref 19–32)
CREATININE: 1.01 mg/dL (ref 0.40–1.50)
Calcium: 9.5 mg/dL (ref 8.4–10.5)
GFR: 83.04 mL/min (ref >60.00)
Glucose, Bld: 94 mg/dL (ref 70–99)
POTASSIUM: 3.8 meq/L (ref 3.5–5.1)
SODIUM: 139 meq/L (ref 135–145)
Total Bilirubin: 0.7 mg/dL (ref 0.2–1.2)
Total Protein: 6.8 g/dL (ref 6.0–8.3)

## 2017-05-08 LAB — LIPID PANEL
CHOL/HDL RATIO: 7
Cholesterol: 265 mg/dL — ABNORMAL HIGH (ref 0–200)
HDL: 37.7 mg/dL — ABNORMAL LOW (ref >39.00)
Triglycerides: 1080 mg/dL — ABNORMAL HIGH (ref 0.0–149.0)

## 2017-05-08 LAB — TSH: TSH: 3.94 u[IU]/mL (ref 0.35–4.50)

## 2017-05-08 LAB — LDL CHOLESTEROL, DIRECT: LDL DIRECT: 128 mg/dL

## 2017-05-08 MED ORDER — LISINOPRIL-HYDROCHLOROTHIAZIDE 20-25 MG PO TABS: 1.0000 | ORAL_TABLET | Freq: Every day | ORAL | 3 refills | Status: DC

## 2017-05-08 NOTE — Assessment & Plan Note (Signed)
bp is up off medication BP: (!) 148/86    He will start back on lisinopril hct  F/u 4-6 wk with good compliance We will add amlodipine at that time if not at goal Given DASH eating hand out  Lab today

## 2017-05-08 NOTE — Assessment & Plan Note (Signed)
Discussed how this problem influences overall health and the risks it imposes  Reviewed plan for weight loss with lower calorie diet (via better food choices and also portion control or program like weight watchers) and exercise building up to or more than 30 minutes 5 days per week including some aerobic activity    

## 2017-05-08 NOTE — Progress Notes (Signed)
Subjective:    Patient ID: Albert Tran, male    DOB: 1965/12/13, 51 y.o.   MRN: [id number]  HPI Here for f/u of chronic medical problems  [place]   Has been feeling decent lately  Having neck deg disc problems- that is calming down now    Wt Readings from Last 3 Encounters:  05/08/17 204 lb 8 oz (92.8 kg)  01/10/17 202 lb 4 oz (91.7 kg)  12/26/16 208 lb (94.3 kg)  wt is stable  Eating a healthy diet  Started getting some more exercise   (caring for wife makes it hard)- has MS and broke her arm in June  34.03 kg/m  bp is up on first check today No cp or palpitations or headaches or edema  No side effects to medicines  BP Readings from Last 3 Encounters:  05/08/17 (!) 148/86  01/10/17 (!) 130/92  12/26/16 (!) 164/100     On lisinopril hct  Took amlodipine for elevated bp if it is still high  He has not taken bp medicine in 10 days - he forgot on vacation    Lab Results  Component Value Date   CREATININE 1.05 09/14/2015   BUN 15 09/14/2015   NA 141 09/14/2015   K 4.1 09/14/2015   CL 102 09/14/2015   CO2 31 09/14/2015   Lab Results  Component Value Date   ALT 39 12/24/2015   AST 31 12/24/2015   ALKPHOS 57 09/14/2015   BILITOT 0.6 09/14/2015    Due for labs   Hyperlipidemia Lab Results  Component Value Date   CHOL 212 (H) 12/24/2015   HDL 37.80 (L) 12/24/2015   LDLDIRECT 103.0 12/24/2015   TRIG (H) 12/24/2015    423.0 Triglyceride is over 400; calculations on Lipids are invalid.   CHOLHDL 6 12/24/2015   Has family hx of CAD Had soup for lunch   Declined flu shot    Patient Active Problem List   Diagnosis Date Noted  . Dyslipidemia 09/13/2016  . Neck pain 09/12/2016  . Family history of early CAD 09/12/2016  . Knee pain, left 11/15/2015  . Disorder of patella 11/15/2015  . Stress reaction 09/14/2015  . Essential hypertension 08/09/2015  . Obesity 08/09/2015  . Right-sided chest wall pain 06/19/2012  .  Snoring 03/22/2011  . FATTY LIVER DISEASE 06/29/2010  . CHOLELITHIASIS 06/14/2010  . Hyperlipidemia 06/09/2010  . HYPERBILIRUBINEMIA 06/09/2010  . HEADACHE 06/09/2010  . TRANSAMINASES, SERUM, ELEVATED 06/09/2010  . CARPAL TUNNEL SYNDROME 10/11/2007  . VERTIGO 09/10/2007   Past Medical History:  Diagnosis Date  . Carpal tunnel syndrome   . DDD (degenerative disc disease)   . Fatty liver   . Gallstones   . Headache(784.0)    likely migraines  . Hypertension   . Hypertriglyceridemia   . Nosebleed    Hx of  . Plantar fasciitis   . Vertigo, intermittent    Past Surgical History:  Procedure Laterality Date  . HERNIA REPAIR     as a teen  . LAMINECTOMY     LS disc sx  . TONSILLECTOMY     Social History  Substance Use Topics  . Smoking status: Former Smoker    Quit date: 09/05/1987  . Smokeless tobacco: Former Neurosurgeon    Types: Chew  . Alcohol use No   Family History  Problem Relation Age of Onset  . Heart disease Mother        CAD  .  Alcohol abuse Father   . Diabetes Father   . Heart disease Father        CAD  . Nephrolithiasis Father   . Obesity Brother   . Heart attack Brother 48  . Cancer Paternal Uncle        bladder CA  . Heart disease Sister 50       CAD with 4 stents   No Known Allergies Current Outpatient Prescriptions on File Prior to Visit  Medication Sig Dispense Refill  . aspirin-acetaminophen-caffeine (EXCEDRIN MIGRAINE) 250-250-65 MG per tablet Take 2 tablets by mouth every 6 (six) hours as needed for headache.    . cyclobenzaprine (FLEXERIL) 10 MG tablet Take 1 tablet (10 mg total) by mouth 3 (three) times daily as needed for muscle spasms. 30 tablet 0  . naproxen (NAPROSYN) 500 MG tablet Take 1 tablet (500 mg total) by mouth 2 (two) times daily with a meal. 30 tablet 0  . Omega-3 Fatty Acids (FISH OIL) 1000 MG CAPS Take 2 capsules (2,000 mg total) by mouth daily. 180 capsule 3   No current facility-administered medications on file prior to visit.       Review of Systems Review of Systems  Constitutional: Negative for fever, appetite change, fatigue and unexpected weight change.  Eyes: Negative for pain and visual disturbance.  Respiratory: Negative for cough and shortness of breath.   Cardiovascular: Negative for cp or palpitations    Gastrointestinal: Negative for nausea, diarrhea and constipation.  Genitourinary: Negative for urgency and frequency.  Skin: Negative for pallor or rash   Neurological: Negative for weakness, light-headedness, numbness and headaches.  Hematological: Negative for adenopathy. Does not bruise/bleed easily.  Psychiatric/Behavioral: Negative for dysphoric mood. The patient is not nervous/anxious.  pos for stressors        Objective:   Physical Exam  Constitutional: He appears well-developed and well-nourished. No distress.  obese and well appearing   HENT:  Head: Normocephalic and atraumatic.  Right Ear: External ear normal.  Left Ear: External ear normal.  Mouth/Throat: Oropharynx is clear and moist. No oropharyngeal exudate.  Scant cerumen bilat  Eyes: Pupils are equal, round, and reactive to light. Conjunctivae and EOM are normal.  Neck: Normal range of motion. Neck supple. No JVD present. Carotid bruit is not present. No thyromegaly present.  Cardiovascular: Normal rate, regular rhythm, normal heart sounds and intact distal pulses.  Exam reveals no gallop.   Pulmonary/Chest: Effort normal and breath sounds normal. No respiratory distress. He has no wheezes. He has no rales.  No crackles  Abdominal: Soft. Bowel sounds are normal. He exhibits no distension, no abdominal bruit and no mass. There is no tenderness.  Musculoskeletal: He exhibits no edema.  Lymphadenopathy:    He has no cervical adenopathy.  Neurological: He is alert. He has normal reflexes. No cranial nerve deficit. He exhibits normal muscle tone. Coordination normal.  Skin: Skin is warm and dry. No rash noted. No pallor.   Psychiatric: He has a normal mood and affect. His mood appears not anxious. He does not exhibit a depressed mood.          Assessment & Plan:   Problem List Items Addressed This Visit      Cardiovascular and Mediastinum   Essential hypertension - Primary    bp is up off medication BP: (!) 148/86    He will start back on lisinopril hct  F/u 4-6 wk with good compliance We will add amlodipine at that time if not at  goal Given DASH eating hand out  Lab today      Relevant Medications   lisinopril-hydrochlorothiazide (PRINZIDE,ZESTORETIC) 20-25 MG tablet   Other Relevant Orders   CBC with Differential/Platelet (Completed)   Comprehensive metabolic panel (Completed)   Lipid panel (Completed)   TSH (Completed)     Other   Hyperlipidemia    Disc goals for lipids and reasons to control them Rev labs with pt (last draw) Rev low sat fat diet in detail Lab today      Relevant Medications   lisinopril-hydrochlorothiazide (PRINZIDE,ZESTORETIC) 20-25 MG tablet   Other Relevant Orders   Lipid panel (Completed)   Obesity    Discussed how this problem influences overall health and the risks it imposes  Reviewed plan for weight loss with lower calorie diet (via better food choices and also portion control or program like weight watchers) and exercise building up to or more than 30 minutes 5 days per week including some aerobic activity         Stress reaction    Pt states he feels chronically distressed but not depressed  Rev dep screening  Reviewed stressors/ coping techniques/symptoms/ support sources/ tx options and side effects in detail today Stressed imp of self care

## 2017-05-08 NOTE — Assessment & Plan Note (Signed)
Disc goals for lipids and reasons to control them Rev labs with pt (last draw)  Rev low sat fat diet in detail Lab today  

## 2017-05-08 NOTE — Assessment & Plan Note (Signed)
Pt states he feels chronically distressed but not depressed  Rev dep screening  Reviewed stressors/ coping techniques/symptoms/ support sources/ tx options and side effects in detail today Stressed imp of self care

## 2017-05-08 NOTE — Patient Instructions (Signed)
Continue lisinopril hct daily  Hold the amlodipine but do not take it for now  Follow up in 4-6 weeks -if BP is high we will re start the amlodipine Your BP is high today with no medication   Labs today   Take care of yourself  Avoid processed foods with lots of sodium if you can

## 2017-06-11 ENCOUNTER — Ambulatory Visit (INDEPENDENT_AMBULATORY_CARE_PROVIDER_SITE_OTHER): Payer: Commercial Managed Care - PPO

## 2017-06-11 ENCOUNTER — Other Ambulatory Visit: Payer: Self-pay

## 2017-06-11 DIAGNOSIS — R0602 Shortness of breath: Secondary | ICD-10-CM | POA: Diagnosis not present

## 2017-06-13 ENCOUNTER — Ambulatory Visit (INDEPENDENT_AMBULATORY_CARE_PROVIDER_SITE_OTHER): Payer: Commercial Managed Care - PPO | Admitting: Family Medicine

## 2017-06-13 ENCOUNTER — Encounter: Payer: Self-pay | Admitting: Family Medicine

## 2017-06-13 VITALS — BP 128/74 | HR 76 | Temp 98.6°F | Ht 65.0 in | Wt 203.2 lb

## 2017-06-13 DIAGNOSIS — Z6834 Body mass index (BMI) 34.0-34.9, adult: Secondary | ICD-10-CM

## 2017-06-13 DIAGNOSIS — E6609 Other obesity due to excess calories: Secondary | ICD-10-CM | POA: Diagnosis not present

## 2017-06-13 DIAGNOSIS — E781 Pure hyperglyceridemia: Secondary | ICD-10-CM | POA: Diagnosis not present

## 2017-06-13 DIAGNOSIS — I10 Essential (primary) hypertension: Secondary | ICD-10-CM | POA: Diagnosis not present

## 2017-06-13 MED ORDER — FENOFIBRATE 54 MG PO TABS: 54.0000 mg | ORAL_TABLET | Freq: Every day | ORAL | 3 refills | Status: DC

## 2017-06-13 NOTE — Progress Notes (Signed)
Subjective:    Patient ID: Albert Tran, male    DOB: March 04, 1966, 51 y.o.   MRN: [id number]  HPI Here for f/u of chronic health problems  Feeling worn out in general - work and caring for his wife    Wt Readings from Last 3 Encounters:  06/13/17 203 lb 4 oz (92.2 kg)  05/08/17 204 lb 8 oz (92.8 kg)  01/10/17 202 lb 4 oz (91.7 kg)   33.82 kg/m   bp is improved today back on lisinopril/hct  No cp or palpitations or headaches or edema   No side effects to medicine- doing fine  BP Readings from Last 3 Encounters:  06/13/17 128/74  05/08/17 (!) 148/86  01/10/17 (!) 130/92      Hyperlipidemia Lab Results  Component Value Date   CHOL 265 (H) 05/08/2017   CHOL 212 (H) 12/24/2015   CHOL 229 (H) 09/14/2015   Lab Results  Component Value Date   HDL 37.70 (L) 05/08/2017   HDL 37.80 (L) 12/24/2015   HDL 39.20 09/14/2015   No results found for: [facility] Lab Results  Component Value Date   TRIG (H) 05/08/2017    1080.0 Triglyceride is over 400; calculations on Lipids are invalid.   TRIG (H) 12/24/2015    423.0 Triglyceride is over 400; calculations on Lipids are invalid.   TRIG (H) 09/14/2015    569.0 Triglyceride is over 400; calculations on Lipids are invalid.   Lab Results  Component Value Date   CHOLHDL 7 05/08/2017   CHOLHDL 6 12/24/2015   CHOLHDL 6 09/14/2015   Lab Results  Component Value Date   LDLDIRECT 128.0 05/08/2017   LDLDIRECT 103.0 12/24/2015   LDLDIRECT 135.0 09/14/2015  diet varies  Not eating as well for the past 3 months since wife broke her arm  Is eating more burgers/cheese  Some chips/fries-not every day     Glucose nl at 94  Lab Results  Component Value Date   CREATININE 1.01 05/08/2017   BUN 14 05/08/2017   NA 139 05/08/2017   K 3.8 05/08/2017   CL 103 05/08/2017   CO2 28 05/08/2017   Lab Results  Component Value Date   ALT 29 05/08/2017   AST 24 05/08/2017   ALKPHOS 62 05/08/2017   BILITOT 0.7 05/08/2017      Patient Active Problem List   Diagnosis Date Noted  . Dyslipidemia 09/13/2016  . Family history of early CAD 09/12/2016  . Disorder of patella 11/15/2015  . Stress reaction 09/14/2015  . Essential hypertension 08/09/2015  . Obesity 08/09/2015  . Snoring 03/22/2011  . FATTY LIVER DISEASE 06/29/2010  . CHOLELITHIASIS 06/14/2010  . Hypertriglyceridemia 06/09/2010  . HYPERBILIRUBINEMIA 06/09/2010  . HEADACHE 06/09/2010  . TRANSAMINASES, SERUM, ELEVATED 06/09/2010  . CARPAL TUNNEL SYNDROME 10/11/2007  . VERTIGO 09/10/2007   Past Medical History:  Diagnosis Date  . Carpal tunnel syndrome   . DDD (degenerative disc disease)   . Fatty liver   . Gallstones   . Headache(784.0)    likely migraines  . Hypertension   . Hypertriglyceridemia   . Nosebleed    Hx of  . Plantar fasciitis   . Vertigo, intermittent    Past Surgical History:  Procedure Laterality Date  . HERNIA REPAIR     as a teen  . LAMINECTOMY     LS disc sx  . TONSILLECTOMY     Social History  Substance Use Topics  . Smoking status: Former Engineer, civil (consulting)  date: 09/05/1987  . Smokeless tobacco: Former Neurosurgeon    Types: Chew  . Alcohol use No   Family History  Problem Relation Age of Onset  . Heart disease Mother        CAD  . Alcohol abuse Father   . Diabetes Father   . Heart disease Father        CAD  . Nephrolithiasis Father   . Obesity Brother   . Heart attack Brother 48  . Cancer Paternal Uncle        bladder CA  . Heart disease Sister 15       CAD with 4 stents   No Known Allergies Current Outpatient Prescriptions on File Prior to Visit  Medication Sig Dispense Refill  . aspirin-acetaminophen-caffeine (EXCEDRIN MIGRAINE) 250-250-65 MG per tablet Take 2 tablets by mouth every 6 (six) hours as needed for headache.    . cyclobenzaprine (FLEXERIL) 10 MG tablet Take 1 tablet (10 mg total) by mouth 3 (three) times daily as needed for muscle spasms. 30 tablet 0  . lisinopril-hydrochlorothiazide  (PRINZIDE,ZESTORETIC) 20-25 MG tablet Take 1 tablet by mouth daily. 90 tablet 3  . naproxen (NAPROSYN) 500 MG tablet Take 1 tablet (500 mg total) by mouth 2 (two) times daily with a meal. 30 tablet 0  . Omega-3 Fatty Acids (FISH OIL) 1000 MG CAPS Take 2 capsules (2,000 mg total) by mouth daily. 180 capsule 3   No current facility-administered medications on file prior to visit.     Review of Systems Review of Systems  Constitutional: Negative for fever, appetite change, fatigue and unexpected weight change.  Eyes: Negative for pain and visual disturbance.  Respiratory: Negative for cough and shortness of breath.   Cardiovascular: Negative for cp or palpitations    Gastrointestinal: Negative for nausea, diarrhea and constipation.  Genitourinary: Negative for urgency and frequency.  Skin: Negative for pallor or rash   Neurological: Negative for weakness, light-headedness, numbness and headaches.  Hematological: Negative for adenopathy. Does not bruise/bleed easily.  Psychiatric/Behavioral: Negative for dysphoric mood. The patient is not nervous/anxious.         Objective:   Physical Exam  Constitutional: He appears well-developed and well-nourished. No distress.  obese and well appearing   HENT:  Head: Normocephalic and atraumatic.  Mouth/Throat: Oropharynx is clear and moist. No oropharyngeal exudate.  Eyes: Pupils are equal, round, and reactive to light. Conjunctivae and EOM are normal.  Neck: Normal range of motion. Neck supple. No JVD present. Carotid bruit is not present. No thyromegaly present.  Cardiovascular: Normal rate, regular rhythm, normal heart sounds and intact distal pulses.  Exam reveals no gallop.   Pulmonary/Chest: Effort normal and breath sounds normal. No respiratory distress. He has no wheezes. He has no rales.  No crackles  Abdominal: Soft. Bowel sounds are normal. He exhibits no distension, no abdominal bruit and no mass. There is no tenderness.    Musculoskeletal: He exhibits no edema.  Lymphadenopathy:    He has no cervical adenopathy.  Neurological: He is alert. He has normal reflexes. No cranial nerve deficit. He exhibits normal muscle tone. Coordination normal.  Skin: Skin is warm and dry. No rash noted. No pallor.  Psychiatric: He has a normal mood and affect. His mood appears not anxious. He does not exhibit a depressed mood.          Assessment & Plan:   Problem List Items Addressed This Visit      Cardiovascular and Mediastinum   Essential hypertension -  Primary    Much improved with lisinopril hct  BP: 128/74  Disc healthy habits/ DASH eating plan        Relevant Medications   fenofibrate 54 MG tablet     Other   Hypertriglyceridemia    Trig over 1000  Will start low dose fenofibrate and also watch diet much more closely  Update if side effects  Lab in 4-6 weeks  Titrate med as needed       Relevant Medications   fenofibrate 54 MG tablet   Obesity    Discussed how this problem influences overall health and the risks it imposes  Reviewed plan for weight loss with lower calorie diet (via better food choices and also portion control or program like weight watchers) and exercise building up to or more than 30 minutes 5 days per week including some aerobic activity

## 2017-06-13 NOTE — Patient Instructions (Signed)
Start fenofibrate 54 mg once daily  Eat a low fat diet  (Avoid red meat/ fried foods/ egg yolks/ fatty breakfast meats/ butter, cheese and high fat dairy/ and shellfish )  Also watch carbohydrates   (Try to get most of your carbohydrates from produce (with the exception of white potatoes)  Eat less bread/pasta/rice/snack foods/cereals/sweets and other items from the middle of the grocery store (processed carbs)  Also exercise helps   We will check labs in 4-6 weeks

## 2017-06-14 NOTE — Assessment & Plan Note (Signed)
Trig over 1000  Will start low dose fenofibrate and also watch diet much more closely  Update if side effects  Lab in 4-6 weeks  Titrate med as needed

## 2017-06-14 NOTE — Assessment & Plan Note (Signed)
Discussed how this problem influences overall health and the risks it imposes  Reviewed plan for weight loss with lower calorie diet (via better food choices and also portion control or program like weight watchers) and exercise building up to or more than 30 minutes 5 days per week including some aerobic activity    

## 2017-06-14 NOTE — Assessment & Plan Note (Signed)
Much improved with lisinopril hct  BP: 128/74  Disc healthy habits/ DASH eating plan

## 2017-11-20 DIAGNOSIS — H10011 Acute follicular conjunctivitis, right eye: Secondary | ICD-10-CM | POA: Diagnosis not present

## 2018-04-28 ENCOUNTER — Other Ambulatory Visit: Payer: Self-pay | Admitting: Family Medicine

## 2018-06-07 ENCOUNTER — Ambulatory Visit (INDEPENDENT_AMBULATORY_CARE_PROVIDER_SITE_OTHER): Payer: Commercial Managed Care - PPO | Admitting: Family Medicine

## 2018-06-07 ENCOUNTER — Encounter: Payer: Self-pay | Admitting: Family Medicine

## 2018-06-07 VITALS — BP 150/92 | HR 61 | Temp 97.8°F | Ht 65.0 in | Wt 193.0 lb

## 2018-06-07 DIAGNOSIS — R61 Generalized hyperhidrosis: Secondary | ICD-10-CM | POA: Diagnosis not present

## 2018-06-07 DIAGNOSIS — I10 Essential (primary) hypertension: Secondary | ICD-10-CM

## 2018-06-07 DIAGNOSIS — R42 Dizziness and giddiness: Secondary | ICD-10-CM | POA: Diagnosis not present

## 2018-06-07 DIAGNOSIS — R51 Headache: Secondary | ICD-10-CM

## 2018-06-07 DIAGNOSIS — M62838 Other muscle spasm: Secondary | ICD-10-CM

## 2018-06-07 DIAGNOSIS — R519 Headache, unspecified: Secondary | ICD-10-CM

## 2018-06-07 LAB — CBC WITH DIFFERENTIAL/PLATELET
BASOS PCT: 0.3 % (ref 0.0–3.0)
Basophils Absolute: 0 10*3/uL (ref 0.0–0.1)
EOS PCT: 0.6 % (ref 0.0–5.0)
Eosinophils Absolute: 0.1 10*3/uL (ref 0.0–0.7)
HCT: 43.9 % (ref 39.0–52.0)
Hemoglobin: 15 g/dL (ref 13.0–17.0)
LYMPHS ABS: 1 10*3/uL (ref 0.7–4.0)
Lymphocytes Relative: 9.8 % — ABNORMAL LOW (ref 12.0–46.0)
MCHC: 34.3 g/dL (ref 30.0–36.0)
MCV: 87 fl (ref 78.0–100.0)
MONO ABS: 0.4 10*3/uL (ref 0.1–1.0)
MONOS PCT: 4.3 % (ref 3.0–12.0)
NEUTROS ABS: 8.8 10*3/uL — AB (ref 1.4–7.7)
NEUTROS PCT: 85 % — AB (ref 43.0–77.0)
PLATELETS: 261 10*3/uL (ref 150.0–400.0)
RBC: 5.05 Mil/uL (ref 4.22–5.81)
RDW: 12.7 % (ref 11.5–15.5)
WBC: 10.4 10*3/uL (ref 4.0–10.5)

## 2018-06-07 LAB — COMPREHENSIVE METABOLIC PANEL
ALK PHOS: 56 U/L (ref 39–117)
ALT: 25 U/L (ref 0–53)
AST: 22 U/L (ref 0–37)
Albumin: 4.6 g/dL (ref 3.5–5.2)
BUN: 18 mg/dL (ref 6–23)
CHLORIDE: 101 meq/L (ref 96–112)
CO2: 28 meq/L (ref 19–32)
Calcium: 10.2 mg/dL (ref 8.4–10.5)
Creatinine, Ser: 0.9 mg/dL (ref 0.40–1.50)
GFR: 94.45 mL/min (ref >60.00)
GLUCOSE: 145 mg/dL — AB (ref 70–99)
POTASSIUM: 4.1 meq/L (ref 3.5–5.1)
Sodium: 138 mEq/L (ref 135–145)
TOTAL PROTEIN: 8 g/dL (ref 6.0–8.3)
Total Bilirubin: 1 mg/dL (ref 0.2–1.2)

## 2018-06-07 LAB — TSH: TSH: 3.18 u[IU]/mL (ref 0.35–4.50)

## 2018-06-07 MED ORDER — MECLIZINE HCL 25 MG PO TABS: 25.0000 mg | ORAL_TABLET | Freq: Three times a day (TID) | ORAL | 0 refills | Status: DC | PRN

## 2018-06-07 MED ORDER — CYCLOBENZAPRINE HCL 10 MG PO TABS: 10.0000 mg | ORAL_TABLET | Freq: Three times a day (TID) | ORAL | 0 refills | Status: DC | PRN

## 2018-06-07 NOTE — Progress Notes (Signed)
Subjective:    Patient ID: Albert Tran, male    DOB: 10-Jan-1966, 52 y.o.   MRN: [id number]  HPI Here with symptoms of HA/ dizziness and sweating  Went to bed -ok  Then got dizzy when head hit the pillow  Got up to walk -more dizzy  2 am -woke up sweating   (hot and cold all night )- did not have a fever No body aches   Left for work this am - 5:30 am  Got sweaty at work  A bit of a headache (whole head /not face)  Ears ar ringing louder than normal   Had a head cold 2 weeks ago  Some sneezing/runny nose now  No colored yellow drainage No cough  No sore throat    Wt Readings from Last 3 Encounters:  06/07/18 193 lb (87.5 kg)  06/13/17 203 lb 4 oz (92.2 kg)  05/08/17 204 lb 8 oz (92.8 kg)   32.12 kg/m   Temp Readings from Last 3 Encounters:  06/07/18 97.8 F (36.6 C) (Oral)  06/13/17 98.6 F (37 C) (Oral)  05/08/17 98.4 F (36.9 C) (Oral)   BP Readings from Last 3 Encounters:  06/07/18 (!) 150/92  06/13/17 128/74  05/08/17 (!) 148/86  was off of his medicine- 1-2 weeks- just got back on it several days ago   No one sick at home  Always sick people at work   No etoh    Pulse Readings from Last 3 Encounters:  06/07/18 61  06/13/17 76  05/08/17 69   persistent neck spasm on L side   Patient Active Problem List   Diagnosis Date Noted  . Night sweats 06/07/2018  . Dizziness 06/07/2018  . Dyslipidemia 09/13/2016  . Family history of early CAD 09/12/2016  . Disorder of patella 11/15/2015  . Stress reaction 09/14/2015  . Essential hypertension 08/09/2015  . Obesity 08/09/2015  . Snoring 03/22/2011  . FATTY LIVER DISEASE 06/29/2010  . CHOLELITHIASIS 06/14/2010  . Hypertriglyceridemia 06/09/2010  . HYPERBILIRUBINEMIA 06/09/2010  . Headache 06/09/2010  . TRANSAMINASES, SERUM, ELEVATED 06/09/2010  . CARPAL TUNNEL SYNDROME 10/11/2007  . VERTIGO 09/10/2007   Past Medical History:  Diagnosis Date  . Carpal tunnel syndrome   . DDD (degenerative  disc disease)   . Fatty liver   . Gallstones   . Headache(784.0)    likely migraines  . Hypertension   . Hypertriglyceridemia   . Nosebleed    Hx of  . Plantar fasciitis   . Vertigo, intermittent    Past Surgical History:  Procedure Laterality Date  . HERNIA REPAIR     as a teen  . LAMINECTOMY     LS disc sx  . TONSILLECTOMY     Social History   Tobacco Use  . Smoking status: Former Smoker    Last attempt to quit: 09/05/1987    Years since quitting: 30.7  . Smokeless tobacco: Former Neurosurgeon    Types: Chew  Substance Use Topics  . Alcohol use: No    Alcohol/week: 0.0 standard drinks  . Drug use: No   Family History  Problem Relation Age of Onset  . Heart disease Mother        CAD  . Alcohol abuse Father   . Diabetes Father   . Heart disease Father        CAD  . Nephrolithiasis Father   . Obesity Brother   . Heart attack Brother 48  . Cancer Paternal Uncle  bladder CA  . Heart disease Sister 74       CAD with 4 stents   No Known Allergies Current Outpatient Medications on File Prior to Visit  Medication Sig Dispense Refill  . aspirin-acetaminophen-caffeine (EXCEDRIN MIGRAINE) 250-250-65 MG per tablet Take 2 tablets by mouth every 6 (six) hours as needed for headache.    . lisinopril-hydrochlorothiazide (PRINZIDE,ZESTORETIC) 20-25 MG tablet Take 1 tablet by mouth daily. **Needs office visit before future refills are given** 90 tablet 0  . naproxen (NAPROSYN) 500 MG tablet Take 1 tablet (500 mg total) by mouth 2 (two) times daily with a meal. 30 tablet 0  . Omega-3 Fatty Acids (FISH OIL) 1000 MG CAPS Take 2 capsules (2,000 mg total) by mouth daily. 180 capsule 3  . fenofibrate 54 MG tablet Take 1 tablet (54 mg total) by mouth daily. (Patient not taking: Reported on 06/07/2018) 30 tablet 3   No current facility-administered medications on file prior to visit.     Review of Systems  Constitutional: Positive for diaphoresis and fatigue. Negative for activity  change, appetite change, chills, fever and unexpected weight change.  HENT: Positive for postnasal drip. Negative for congestion, rhinorrhea, sore throat, trouble swallowing and voice change.   Eyes: Negative for pain, redness, itching and visual disturbance.  Respiratory: Negative for cough, chest tightness, shortness of breath and wheezing.   Cardiovascular: Negative for chest pain and palpitations.  Gastrointestinal: Negative for abdominal pain, blood in stool, constipation, diarrhea and nausea.  Endocrine: Negative for cold intolerance, heat intolerance, polydipsia and polyuria.  Genitourinary: Negative for difficulty urinating, dysuria, frequency, hematuria and urgency.  Musculoskeletal: Negative for arthralgias, joint swelling and myalgias.  Skin: Negative for pallor and rash.  Neurological: Positive for dizziness. Negative for tremors, seizures, syncope, facial asymmetry, speech difficulty, weakness, numbness and headaches.  Hematological: Negative for adenopathy. Does not bruise/bleed easily.  Psychiatric/Behavioral: Negative for decreased concentration and dysphoric mood. The patient is not nervous/anxious.        Objective:   Physical Exam  Constitutional: He appears well-developed and well-nourished. No distress.  Well appearing - was diaphoretic at presentation and now much improved   HENT:  Head: Normocephalic and atraumatic.  Right Ear: External ear normal.  Left Ear: External ear normal.  Mouth/Throat: Oropharynx is clear and moist. No oropharyngeal exudate.  Boggy nares TMs clear No sinus tenderness Throat is clear  Eyes: Pupils are equal, round, and reactive to light. Conjunctivae and EOM are normal. Right eye exhibits no discharge. Left eye exhibits no discharge. No scleral icterus.  Neck: Normal range of motion. Neck supple. No thyromegaly present.  Cardiovascular: Normal rate, regular rhythm and normal heart sounds.  Pulmonary/Chest: Effort normal and breath sounds  normal. No stridor. No respiratory distress. He has no wheezes. He has no rales.  Abdominal: Soft. Bowel sounds are normal. He exhibits no distension and no mass. There is no tenderness. There is no guarding.  Musculoskeletal: Normal range of motion. He exhibits no edema, tenderness or deformity.  Lymphadenopathy:    He has no cervical adenopathy.  Neurological: He is alert. He displays normal reflexes. No cranial nerve deficit. He exhibits normal muscle tone. Coordination normal.  Skin: Skin is warm and dry. No rash noted. No erythema. No pallor.  No insect bites or rash  Psychiatric: He has a normal mood and affect.          Assessment & Plan:   Problem List Items Addressed This Visit      Cardiovascular and  Mediastinum   Essential hypertension    bp is up - feeling sick today       Relevant Orders   CBC with Differential/Platelet (Completed)   Comprehensive metabolic panel (Completed)   TSH (Completed)     Other   Dizziness - Primary    With nystagmus  ? If vertigo from viral labyrinthitis (has viral symptoms as well) Meclizine px  Lab done incl tick labs  Reassuring neuro exam       Relevant Orders   Rocky mtn spotted fvr abs pnl(IgG+IgM)   B. Burgdorfi Antibodies   Headache    With dizziness and viral symptoms  Had a cold 2 wk ago but no facial pain or pressure  Some vertigo symptoms  Sweats but no fever  Also poss concern for heat exhaustion Watch for inc HA or neck stiffness Analgesics /rest  Adv to stay in today in air conditioning and rest and lots of fluids Lab today incl tick lab        Relevant Medications   cyclobenzaprine (FLEXERIL) 10 MG tablet   Other Relevant Orders   Rocky mtn spotted fvr abs pnl(IgG+IgM)   B. Burgdorfi Antibodies   Night sweats    No fever but sweats Also viral symptoms and dizziness Lab today  Reassuring exam  Rest in cool air with lots of fluids       Relevant Orders   CBC with Differential/Platelet (Completed)     Comprehensive metabolic panel (Completed)   TSH (Completed)   Rocky mtn spotted fvr abs pnl(IgG+IgM)   B. Burgdorfi Antibodies   VERTIGO    ? If poss viral labyrinthitis with other viral symptoms Meclizine Change positions slowly  Rest  Lab today  Alert if symptoms worsen      Relevant Medications   meclizine (ANTIVERT) 25 MG tablet    Other Visit Diagnoses    Muscle spasm       Relevant Medications   cyclobenzaprine (FLEXERIL) 10 MG tablet

## 2018-06-07 NOTE — Patient Instructions (Addendum)
Labs today  Drink lots of fluids/ rest and stay in the cool today (stay in)  Take meclizine if needed for dizziness if worse again (it is sedating)   Flexeril for neck spasm if needed   Tylenol for sweats/ ? Fever /any aches/pain as directed   Update if not starting to improve in a week or if worsening   If severe headache or neck pain- go to the ER

## 2018-06-09 NOTE — Assessment & Plan Note (Signed)
?   If poss viral labyrinthitis with other viral symptoms Meclizine Change positions slowly  Rest  Lab today  Alert if symptoms worsen

## 2018-06-09 NOTE — Assessment & Plan Note (Signed)
With nystagmus  ? If vertigo from viral labyrinthitis (has viral symptoms as well) Meclizine px  Lab done incl tick labs  Reassuring neuro exam

## 2018-06-09 NOTE — Assessment & Plan Note (Signed)
bp is up - feeling sick today

## 2018-06-09 NOTE — Assessment & Plan Note (Signed)
No fever but sweats Also viral symptoms and dizziness Lab today  Reassuring exam  Rest in cool air with lots of fluids

## 2018-06-09 NOTE — Assessment & Plan Note (Signed)
With dizziness and viral symptoms  Had a cold 2 wk ago but no facial pain or pressure  Some vertigo symptoms  Sweats but no fever  Also poss concern for heat exhaustion Watch for inc HA or neck stiffness Analgesics /rest  Adv to stay in today in air conditioning and rest and lots of fluids Lab today incl tick lab

## 2018-06-11 LAB — B. BURGDORFI ANTIBODIES: B burgdorferi Ab IgG+IgM: <0.9 index

## 2018-06-11 LAB — ROCKY MTN SPOTTED FVR ABS PNL(IGG+IGM)
RMSF IGG: NOT DETECTED
RMSF IGM: NOT DETECTED

## 2018-06-24 ENCOUNTER — Other Ambulatory Visit: Payer: Self-pay | Admitting: Family Medicine

## 2018-09-03 DIAGNOSIS — H1045 Other chronic allergic conjunctivitis: Secondary | ICD-10-CM | POA: Diagnosis not present

## 2018-12-11 ENCOUNTER — Other Ambulatory Visit: Payer: Self-pay | Admitting: Family Medicine

## 2019-01-20 ENCOUNTER — Emergency Department: Payer: Commercial Managed Care - PPO

## 2019-01-20 ENCOUNTER — Other Ambulatory Visit: Payer: Self-pay

## 2019-01-20 ENCOUNTER — Telehealth: Payer: Self-pay

## 2019-01-20 ENCOUNTER — Emergency Department
Admission: EM | Admit: 2019-01-20 | Discharge: 2019-01-20 | Disposition: A | Discharge status: Home / Self Care | Payer: Commercial Managed Care - PPO | Attending: Emergency Medicine | Admitting: Emergency Medicine

## 2019-01-20 DIAGNOSIS — Z79899 Other long term (current) drug therapy: Secondary | ICD-10-CM | POA: Diagnosis not present

## 2019-01-20 DIAGNOSIS — K5792 Diverticulitis of intestine, part unspecified, without perforation or abscess without bleeding: Secondary | ICD-10-CM | POA: Insufficient documentation

## 2019-01-20 DIAGNOSIS — R109 Unspecified abdominal pain: Secondary | ICD-10-CM | POA: Diagnosis present

## 2019-01-20 DIAGNOSIS — I10 Essential (primary) hypertension: Secondary | ICD-10-CM | POA: Insufficient documentation

## 2019-01-20 DIAGNOSIS — Z87891 Personal history of nicotine dependence: Secondary | ICD-10-CM | POA: Diagnosis not present

## 2019-01-20 DIAGNOSIS — K5900 Constipation, unspecified: Secondary | ICD-10-CM

## 2019-01-20 DIAGNOSIS — R1030 Lower abdominal pain, unspecified: Secondary | ICD-10-CM | POA: Diagnosis not present

## 2019-01-20 LAB — COMPREHENSIVE METABOLIC PANEL
ALT: 28 U/L (ref 0–44)
AST: 22 U/L (ref 15–41)
Albumin: 4.1 g/dL (ref 3.5–5.0)
Alkaline Phosphatase: 59 U/L (ref 38–126)
Anion gap: 9 (ref 5–15)
BUN: 13 mg/dL (ref 6–20)
CO2: 25 mmol/L (ref 22–32)
Calcium: 9.2 mg/dL (ref 8.9–10.3)
Chloride: 106 mmol/L (ref 98–111)
Creatinine, Ser: 0.92 mg/dL (ref 0.61–1.24)
GFR calc Af Amer: >60 mL/min (ref >60)
GFR calc non Af Amer: >60 mL/min (ref >60)
Glucose, Bld: 109 mg/dL — ABNORMAL HIGH (ref 70–99)
Potassium: 3.9 mmol/L (ref 3.5–5.1)
Sodium: 140 mmol/L (ref 135–145)
Total Bilirubin: 1.3 mg/dL — ABNORMAL HIGH (ref 0.3–1.2)
Total Protein: 7.7 g/dL (ref 6.5–8.1)

## 2019-01-20 LAB — CBC
HCT: 39.6 % (ref 39.0–52.0)
Hemoglobin: 13.6 g/dL (ref 13.0–17.0)
MCH: 29.8 pg (ref 26.0–34.0)
MCHC: 34.3 g/dL (ref 30.0–36.0)
MCV: 86.8 fL (ref 80.0–100.0)
Platelets: 264 10*3/uL (ref 150–400)
RBC: 4.56 MIL/uL (ref 4.22–5.81)
RDW: 12 % (ref 11.5–15.5)
WBC: 9 10*3/uL (ref 4.0–10.5)
nRBC: 0 % (ref 0.0–0.2)

## 2019-01-20 LAB — URINALYSIS, COMPLETE (UACMP) WITH MICROSCOPIC
Bacteria, UA: NONE SEEN
Bilirubin Urine: NEGATIVE
Glucose, UA: NEGATIVE mg/dL
Hgb urine dipstick: NEGATIVE
Ketones, ur: NEGATIVE mg/dL
Leukocytes,Ua: NEGATIVE
Nitrite: NEGATIVE
Protein, ur: NEGATIVE mg/dL
Specific Gravity, Urine: 1.014 (ref 1.005–1.030)
pH: 6 (ref 5.0–8.0)

## 2019-01-20 LAB — LIPASE, BLOOD: Lipase: 35 U/L (ref 11–51)

## 2019-01-20 MED ORDER — IOHEXOL 240 MG/ML SOLN
50.0000 mL | Freq: Once | INTRAMUSCULAR | Status: AC
  Administered 2019-01-20: 13:00:00 50 mL via ORAL

## 2019-01-20 MED ORDER — IOHEXOL 300 MG/ML  SOLN
100.0000 mL | Freq: Once | INTRAMUSCULAR | Status: AC | PRN
  Administered 2019-01-20: 100 mL via INTRAVENOUS

## 2019-01-20 MED ORDER — SODIUM CHLORIDE 0.9% FLUSH: 3.0000 mL | Freq: Once | INTRAVENOUS | Status: DC

## 2019-01-20 MED ORDER — AMOXICILLIN-POT CLAVULANATE 875-125 MG PO TABS: 1.0000 | ORAL_TABLET | Freq: Two times a day (BID) | ORAL | 0 refills | Status: AC

## 2019-01-20 NOTE — Discharge Instructions (Signed)
Your CT shows that you have diverticulitis.  I have given you a handout to read about diverticulitis.  Please begin Augmentin for this.  Please call GI for a follow-up appointment.

## 2019-01-20 NOTE — Telephone Encounter (Signed)
Pt said starting 2 wks ago started with constipation and lower abd pain all across lower abd. Last normal BM was soft stool 4 - 5 days ago, not formed; pt taking miralax and only  Water comes out. Not diarrhea. No fever,chills,cough,S/T,SOB,muscle pain,h/a,diarrhea and no loss of taste or smell and no travel and no known exposure to covid or flu. Spoke with [place] who spoke with Dr Milinda Antis; Advised pt to go to ED for eval of possible impaction or obstruction and possible scanning. Pt voiced understanding. FYI to Dr Milinda Antis.

## 2019-01-20 NOTE — ED Triage Notes (Signed)
Pt c/o abd pain with bloating, constipation for the past week , state he has been taking miralax and another liquid softener, states he has had liquid stool for 2 days but feel like he is still constipated. Denies N/V.Marland Kitchen

## 2019-01-20 NOTE — ED Provider Notes (Signed)
[facility] Emergency Department Provider Note  ____________________________________________  Time seen: Approximately 12:54 PM  I have reviewed the triage vital signs and the nursing notes.   HISTORY  Chief Complaint Abdominal Pain and Constipation    HPI Albert Tran is a 53 y.o. male that presents to the emergency department for evaluation of lower abdominal discomfort and constipation for 2 weeks and nonbloody, nonbilious liquid diarrhea for 4 days.  Patient states that he had a hard bowel movement 4 days ago.  Abdominal pain eased but has not completely resolved after his bowel movement 4 days ago.  He started taking MiraLAX 4 days ago to try to soften his bowel movements.  He is continued to have liquidy bowel movements since then.  He also has increased his "ruffage" in his foods.  He still feels like he is constipated. He has gained about 15 pounds in the last 2 months and attributes that to staying home and eating more and not exercising.  He has not had any hospitalizations.  No sick contacts.  No shortness of breath, chest pain.   Past Medical History:  Diagnosis Date  . Carpal tunnel syndrome   . DDD (degenerative disc disease)   . Fatty liver   . Gallstones   . Headache(784.0)    likely migraines  . Hypertension   . Hypertriglyceridemia   . Nosebleed    Hx of  . Plantar fasciitis   . Vertigo, intermittent     Patient Active Problem List   Diagnosis Date Noted  . Night sweats 06/07/2018  . Dizziness 06/07/2018  . Dyslipidemia 09/13/2016  . Family history of early CAD 09/12/2016  . Disorder of patella 11/15/2015  . Stress reaction 09/14/2015  . Essential hypertension 08/09/2015  . Obesity 08/09/2015  . Snoring 03/22/2011  . FATTY LIVER DISEASE 06/29/2010  . CHOLELITHIASIS 06/14/2010  . Hypertriglyceridemia 06/09/2010  . HYPERBILIRUBINEMIA 06/09/2010  . Headache 06/09/2010  . TRANSAMINASES, SERUM, ELEVATED 06/09/2010  . CARPAL  TUNNEL SYNDROME 10/11/2007  . VERTIGO 09/10/2007    Past Surgical History:  Procedure Laterality Date  . HERNIA REPAIR     as a teen  . LAMINECTOMY     LS disc sx  . TONSILLECTOMY      Prior to Admission medications   Medication Sig Start Date End Date Taking? Authorizing Provider  lisinopril-hydrochlorothiazide (PRINZIDE,ZESTORETIC) 20-25 MG tablet TAKE 1 TABLET BY MOUTH  DAILY. 12/12/18  Yes Tower, Audrie GallusMarne A, MD  amoxicillin-clavulanate (AUGMENTIN) 875-125 MG tablet Take 1 tablet by mouth 2 (two) times daily for 10 days. 01/20/19 01/30/19  Enid DerryWagner, Edelyn Heidel, PA-C    Allergies Patient has no known allergies.  Family History  Problem Relation Age of Onset  . Heart disease Mother        CAD  . Alcohol abuse Father   . Diabetes Father   . Heart disease Father        CAD  . Nephrolithiasis Father   . Obesity Brother   . Heart attack Brother 48  . Cancer Paternal Uncle        bladder CA  . Heart disease Sister [id number]       CAD with 4 stents    Social History Social History   Tobacco Use  . Smoking status: Former Smoker    Last attempt to quit: 09/05/1987    Years since quitting: 31.3  . Smokeless tobacco: Former NeurosurgeonUser    Types: Chew  Substance Use Topics  . Alcohol use: No  Alcohol/week: 0.0 standard drinks  . Drug use: No     Review of Systems  Constitutional: No fever/chills Cardiovascular: No chest pain. Respiratory:  No SOB. Gastrointestinal: Positive for abdominal discomfort.  No nausea, no vomiting.  Musculoskeletal: Negative for musculoskeletal pain. Skin: Negative for rash, abrasions, lacerations, ecchymosis. Neurological: Negative for headaches, numbness or tingling   ____________________________________________   PHYSICAL EXAM:  VITAL SIGNS: ED Triage Vitals  Enc Vitals Group     BP 01/20/19 1046 (!) 176/99     Pulse Rate 01/20/19 1046 78     Resp 01/20/19 1046 17     Temp 01/20/19 1046 99 F (37.2 C)     Temp Source 01/20/19 1046 Oral     SpO2  01/20/19 1046 96 %     Weight 01/20/19 1047 207 lb (93.9 kg)     Height 01/20/19 1047  (1.651 m)     Head Circumference --      Peak Flow --      Pain Score 01/20/19 1047 0     Pain Loc --      Pain Edu? --      Excl. in GC? --      Constitutional: Alert and oriented. Well appearing and in no acute distress. Eyes: Conjunctivae are normal. PERRL. EOMI. Head: Atraumatic. ENT:      Ears:      Nose: No congestion/rhinnorhea.      Mouth/Throat: Mucous membranes are moist.  Neck: No stridor.   Cardiovascular: Normal rate, regular rhythm.  Good peripheral circulation. Respiratory: Normal respiratory effort without tachypnea or retractions. Lungs CTAB. Good air entry to the bases with no decreased or absent breath sounds. Gastrointestinal: Bowel sounds 4 quadrants. Soft and nontender to palpation. No guarding or rigidity. No palpable masses. No distention.  Musculoskeletal: Full range of motion to all extremities. No gross deformities appreciated. Neurologic:  Normal speech and language. No gross focal neurologic deficits are appreciated.  Skin:  Skin is warm, dry and intact. No rash noted. Psychiatric: Mood and affect are normal. Speech and behavior are normal. Patient exhibits appropriate insight and judgement.   ____________________________________________   LABS (all labs ordered are listed, but only abnormal results are displayed)  Labs Reviewed  COMPREHENSIVE METABOLIC PANEL - Abnormal; Notable for the following components:      Result Value   Glucose, Bld 109 (*)    Total Bilirubin 1.3 (*)    All other components within normal limits  URINALYSIS, COMPLETE (UACMP) WITH MICROSCOPIC - Abnormal; Notable for the following components:   Color, Urine YELLOW (*)    APPearance CLEAR (*)    All other components within normal limits  LIPASE, BLOOD  CBC   ____________________________________________  EKG   ____________________________________________  RADIOLOGY Lexine Baton, personally viewed and evaluated these images (plain radiographs) as part of my medical decision making, as well as reviewing the written report by the radiologist.  Ct Abdomen Pelvis W Contrast  Result Date: 01/20/2019 CLINICAL DATA:  Constipation and lower abdominal pain for the past 2 weeks. EXAM: CT ABDOMEN AND PELVIS WITH CONTRAST TECHNIQUE: Multidetector CT imaging of the abdomen and pelvis was performed using the standard protocol following bolus administration of intravenous contrast. CONTRAST:  OMNIPAQUE IOHEXOL 300 MG/ML  SOLN COMPARISON:  Abdominal x-rays from same day. Abdominal ultrasound dated June 14, 2010. FINDINGS: Lower chest: No acute abnormality. Hepatobiliary: Hepatic steatosis. No focal liver abnormality. Gallbladder is unremarkable. No biliary dilatation. Pancreas: Unremarkable. No pancreatic ductal dilatation or  surrounding inflammatory changes. Spleen: Normal in size without focal abnormality. Adrenals/Urinary Tract: Adrenal glands are unremarkable. Kidneys are normal, without renal calculi, focal lesion, or hydronephrosis. Bladder is unremarkable. Stomach/Bowel: Left-sided colonic diverticulosis with short segment wall thickening of the proximal sigmoid colon and focal prominent inflammatory changes surrounding a diverticulum, consistent with acute diverticulitis. Normal appendix. The stomach and small bowel are unremarkable. Vascular/Lymphatic: Mild aortic atherosclerosis. No enlarged abdominal or pelvic lymph nodes. Reproductive: Prostate is unremarkable. Other: Tiny fat containing umbilical hernia. No free fluid or pneumoperitoneum. Musculoskeletal: No acute or significant osseous findings. Thoracolumbar spondylosis, moderate to severe at L4-L5. 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 IMPRESSION: 1. Acute sigmoid diverticulitis.  No abscess or perforation. 2. Hepatic steatosis. 3.  Aortic atherosclerosis (ICD10-I70.0). Electronically Signed   By:  Obie Dredge M.D.   On: 01/20/2019 14:02   Dg Abd 2 Views  Result Date: 01/20/2019 CLINICAL DATA:  Abdominal pain and bloating. EXAM: ABDOMEN - 2 VIEW COMPARISON:  None. FINDINGS: The bowel gas pattern is normal. There is no evidence of free air. No radio-opaque calculi or other significant radiographic abnormality is seen. IMPRESSION: No evidence of bowel obstruction or ileus. Electronically Signed   By: Lupita Raider M.D.   On: 01/20/2019 12:47    ____________________________________________    PROCEDURES  Procedure(s) performed:    Procedures    Medications  sodium chloride flush (NS) 0.9 % injection 3 mL (has no administration in time range)  iohexol (OMNIPAQUE) 240 MG/ML injection 50 mL (50 mLs Oral Contrast Given 01/20/19 1324)  iohexol (OMNIPAQUE) 300 MG/ML solution 100 mL (100 mLs Intravenous Contrast Given 01/20/19 1347)     ____________________________________________   INITIAL IMPRESSION / ASSESSMENT AND PLAN / ED COURSE  Pertinent labs & imaging results that were available during my care of the patient were reviewed by me and considered in my medical decision making (see chart for details).  Review of the [place] CSRS was performed in accordance of the [facility] prior to dispensing any controlled drugs.     Patient's diagnosis is consistent with diverticulitis. Labwork is reassuring. Xray negative for obstruction. CT consistent with diverticulitis. Dr. Lenard Lance has personally evaluated the patient and is agreeable with plan of care. Patient will be discharged home with prescriptions for Augmentin. Patient is to follow up with GI as directed. Patient is given ED precautions to return to the ED for any worsening or new symptoms.     ____________________________________________  FINAL CLINICAL IMPRESSION(S) / ED DIAGNOSES  Final diagnoses:  Constipation  Abdominal pain  Diverticulitis      NEW MEDICATIONS STARTED DURING THIS VISIT:  ED Discharge Orders          Ordered    amoxicillin-clavulanate (AUGMENTIN) 875-125 MG tablet  2 times daily     01/20/19 1446              This chart was dictated using voice recognition software/Dragon. Despite best efforts to proofread, errors can occur which can change the meaning. Any change was purely unintentional.    Enid Derry, PA-C 01/20/19 1621    Minna Antis, MD 01/21/19 1756

## 2019-01-20 NOTE — ED Notes (Signed)
Patient transported to CT 

## 2019-01-20 NOTE — ED Provider Notes (Signed)
-----------------------------------------   2:31 PM on 01/20/2019 -----------------------------------------  CT scan shows acute sigmoid diverticulitis.  I discussed with the patient avoiding seeds and nuts.  We will place on Augmentin twice daily.  Have the patient follow-up with his primary care doctor.  I discussed return precautions for fever worsening pain.  Overall the patient appears well, otherwise reassuring work-up.   Minna Antis, MD 01/20/19 1432

## 2019-01-21 ENCOUNTER — Telehealth: Payer: Self-pay | Admitting: Gastroenterology

## 2019-01-21 NOTE — Telephone Encounter (Signed)
Left vm for pt to call office and schedule ED f/u

## 2019-01-23 HISTORY — PX: OTHER SURGICAL HISTORY: SHX169

## 2019-02-06 ENCOUNTER — Other Ambulatory Visit: Payer: Self-pay

## 2019-02-06 ENCOUNTER — Encounter: Payer: Self-pay | Admitting: Gastroenterology

## 2019-02-06 ENCOUNTER — Ambulatory Visit (INDEPENDENT_AMBULATORY_CARE_PROVIDER_SITE_OTHER): Payer: Commercial Managed Care - PPO | Admitting: Gastroenterology

## 2019-02-06 VITALS — BP 134/87 | HR 71 | Temp 97.8°F | Ht 65.0 in | Wt 210.4 lb

## 2019-02-06 DIAGNOSIS — R1084 Generalized abdominal pain: Secondary | ICD-10-CM

## 2019-02-06 DIAGNOSIS — K5732 Diverticulitis of large intestine without perforation or abscess without bleeding: Secondary | ICD-10-CM | POA: Diagnosis not present

## 2019-02-06 NOTE — Progress Notes (Signed)
Gastroenterology Consultation  Referring Provider:     Judy Pimpleower, Marne A, MD Primary Care Physician:  Tower, Audrie GallusMarne A, MD Primary Gastroenterologist:  Dr. Servando SnareWohl     Reason for Consultation:     Diverticulitis        HPI:   Albert Tran is a 53 y.o. y/o male referred for consultation & management of diverticulitis by Dr. Milinda Antisower, Audrie GallusMarne A, MD.  This patient was recently in the emergency room for abdominal pain and was found to have an abnormal CT scan showing:  IMPRESSION: 1. Acute sigmoid diverticulitis.  No abscess or perforation. 2. Hepatic steatosis. 3.  Aortic atherosclerosis (ICD10-I70.0).  The patient was treated with Augmentin and sent to me for evaluation.  The patient was also found to have fatty liver disease.  His liver enzymes were normal except an elevated bilirubin. The patient reports that when he went to the emergency room he was not having any pain but generalized discomfort.  He states that he was feeling very bloated and felt like he was not moving his bowels.  The patient had been taking laxatives without any relief of his symptoms.  The patient was seen in the ER and he states that he had a KUB done and was told that he did not have any stools and was about to be sent home.  The patient then claims that he refused discharge and said that there was something wrong with him and convince them to do a CT scan of the abdomen.  At that time the patient was found to have the diverticulitis and was put on antibiotics as stated above.  The patient now reports that he continues to have the same discomfort with what he describes tingling in different parts of his abdomen both on the right left and center of his abdomen.  The patient also reports that he continues to feel bloated and has some pencil thin stools at times.  He is not having any rectal bleeding black stools or any unexplained weight loss.  Past Medical History:  Diagnosis Date  . Carpal tunnel syndrome   . DDD  (degenerative disc disease)   . Fatty liver   . Gallstones   . Headache(784.0)    likely migraines  . Hypertension   . Hypertriglyceridemia   . Nosebleed    Hx of  . Plantar fasciitis   . Vertigo, intermittent     Past Surgical History:  Procedure Laterality Date  . HERNIA REPAIR     as a teen  . LAMINECTOMY     LS disc sx  . TONSILLECTOMY      Prior to Admission medications   Medication Sig Start Date End Date Taking? Authorizing Provider  lisinopril-hydrochlorothiazide (PRINZIDE,ZESTORETIC) 20-25 MG tablet TAKE 1 TABLET BY MOUTH  DAILY. 12/12/18   Tower, Audrie GallusMarne A, MD    Family History  Problem Relation Age of Onset  . Heart disease Mother        CAD  . Alcohol abuse Father   . Diabetes Father   . Heart disease Father        CAD  . Nephrolithiasis Father   . Obesity Brother   . Heart attack Brother 48  . Cancer Paternal Uncle        bladder CA  . Heart disease Sister 1350       CAD with 4 stents     Social History   Tobacco Use  . Smoking status: Former Smoker  Last attempt to quit: 09/05/1987    Years since quitting: 31.4  . Smokeless tobacco: Former Neurosurgeon    Types: Chew  Substance Use Topics  . Alcohol use: No    Alcohol/week: 0.0 standard drinks  . Drug use: No    Allergies as of 02/06/2019  . (No Known Allergies)    Review of Systems:    All systems reviewed and negative except where noted in HPI.   Physical Exam:  There were no vitals taken for this visit. No LMP for male patient. General:   Alert,  Well-developed, well-nourished, pleasant and cooperative in NAD Head:  Normocephalic and atraumatic. Eyes:  Sclera clear, no icterus.   Conjunctiva pink. Ears:  Normal auditory acuity. Nose:  No deformity, discharge, or lesions. Mouth:  No deformity or lesions,oropharynx pink & moist. Neck:  Supple; no masses or thyromegaly. Lungs:  Respirations even and unlabored.  Clear throughout to auscultation.   No wheezes, crackles, or rhonchi. No acute  distress. Heart:  Regular rate and rhythm; no murmurs, clicks, rubs, or gallops. Abdomen:  Normal bowel sounds.  No bruits.  Soft, non-tender and non-distended without masses, hepatosplenomegaly or hernias noted.  No guarding or rebound tenderness.  Negative Carnett sign.   Rectal:  Deferred.  Msk:  Symmetrical without gross deformities.  Good, equal movement & strength bilaterally. Pulses:  Normal pulses noted. Extremities:  No clubbing or edema.  No cyanosis. Neurologic:  Alert and oriented x3;  grossly normal neurologically. Skin:  Intact without significant lesions or rashes.  No jaundice. Lymph Nodes:  No significant cervical adenopathy. Psych:  Alert and cooperative. Normal mood and affect.  Imaging Studies: Ct Abdomen Pelvis W Contrast  Result Date: 01/20/2019 CLINICAL DATA:  Constipation and lower abdominal pain for the past 2 weeks. EXAM: CT ABDOMEN AND PELVIS WITH CONTRAST TECHNIQUE: Multidetector CT imaging of the abdomen and pelvis was performed using the standard protocol following bolus administration of intravenous contrast. CONTRAST:  OMNIPAQUE IOHEXOL 300 MG/ML  SOLN COMPARISON:  Abdominal x-rays from same day. Abdominal ultrasound dated June 14, 2010. FINDINGS: Lower chest: No acute abnormality. Hepatobiliary: Hepatic steatosis. No focal liver abnormality. Gallbladder is unremarkable. No biliary dilatation. Pancreas: Unremarkable. No pancreatic ductal dilatation or surrounding inflammatory changes. Spleen: Normal in size without focal abnormality. Adrenals/Urinary Tract: Adrenal glands are unremarkable. Kidneys are normal, without renal calculi, focal lesion, or hydronephrosis. Bladder is unremarkable. Stomach/Bowel: Left-sided colonic diverticulosis with short segment wall thickening of the proximal sigmoid colon and focal prominent inflammatory changes surrounding a diverticulum, consistent with acute diverticulitis. Normal appendix. The stomach and small bowel are  unremarkable. Vascular/Lymphatic: Mild aortic atherosclerosis. No enlarged abdominal or pelvic lymph nodes. Reproductive: Prostate is unremarkable. Other: Tiny fat containing umbilical hernia. No free fluid or pneumoperitoneum. Musculoskeletal: No acute or significant osseous findings. Thoracolumbar spondylosis, moderate to severe at L4-L5. 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 IMPRESSION: 1. Acute sigmoid diverticulitis.  No abscess or perforation. 2. Hepatic steatosis. 3.  Aortic atherosclerosis (ICD10-I70.0). Electronically Signed   By: Obie Dredge M.D.   On: 01/20/2019 14:02   Dg Abd 2 Views  Result Date: 01/20/2019 CLINICAL DATA:  Abdominal pain and bloating. EXAM: ABDOMEN - 2 VIEW COMPARISON:  None. FINDINGS: The bowel gas pattern is normal. There is no evidence of free air. No radio-opaque calculi or other significant radiographic abnormality is seen. IMPRESSION: No evidence of bowel obstruction or ileus. Electronically Signed   By: Lupita Raider M.D.   On: 01/20/2019 12:47  Assessment and Plan:   Albert Tran is a 53 y.o. y/o male who was in the ER recently with a episode of diverticulitis.  The patient was not having any abdominal pain associate with a diverticulitis and he also did not have a increased white cell count or fever.  The patient was treated with antibiotics but states he felt only slightly better for short amount of time and now he is back to feeling poorly.  There was no sign of any black stools or bloody stools and he denies ever having a colonoscopy in the past.  The patient will be set up for repeat CT scan to see if he may have unresolved diverticulitis or recurrent diverticulitis although he never felt better.  If his CT scan is negative he has been told that it is likely just the residual pain from the previous diverticulitis and he will have an outpatient colonoscopy 6 weeks after his infection.  If the patient does have diverticulitis he will need  to be treated with antibiotics again.  The patient has been explained the plan and agrees with it.  Midge Minium, MD. Clementeen Graham    Note: This dictation was prepared with Dragon dictation along with smaller phrase technology. Any transcriptional errors that result from this process are unintentional.

## 2019-02-06 NOTE — Patient Instructions (Signed)
You are scheduled for a CT scan abdomen/pelvis at [facility] outpatient imaging/[facility] on Tuesday, June 9th at 10:00am. Please arrive at 9:45am. You cannot have anything to eat or drink after midnight on Monday night. You will need to pick up your contrast media today.  If you need to reschedule this appointment for any reason, please contact central scheduling at [phone].

## 2019-02-11 ENCOUNTER — Ambulatory Visit
Admission: RE | Admit: 2019-02-11 | Discharge: 2019-02-11 | Disposition: A | Discharge status: Home / Self Care | Payer: Commercial Managed Care - PPO | Source: Ambulatory Visit | Attending: Gastroenterology | Admitting: Gastroenterology

## 2019-02-11 ENCOUNTER — Other Ambulatory Visit: Payer: Self-pay

## 2019-02-11 DIAGNOSIS — R1084 Generalized abdominal pain: Secondary | ICD-10-CM | POA: Insufficient documentation

## 2019-02-11 MED ORDER — IOHEXOL 300 MG/ML  SOLN
100.0000 mL | Freq: Once | INTRAMUSCULAR | Status: AC | PRN
  Administered 2019-02-11: 100 mL via INTRAVENOUS

## 2019-02-12 ENCOUNTER — Ambulatory Visit: Payer: Commercial Managed Care - PPO | Admitting: Gastroenterology

## 2019-02-12 ENCOUNTER — Telehealth: Payer: Self-pay | Admitting: Gastroenterology

## 2019-02-12 NOTE — Telephone Encounter (Signed)
Patient called & would like the results to his CT Scan done on 02-11-2019. He is very anxious.Ready to begin a plan of action.

## 2019-02-12 NOTE — Telephone Encounter (Signed)
-----   Message from Lucilla Lame, MD sent at 02/11/2019  3:05 PM EDT ----- Let the patient know that the CT scan showed that he has the diverticulosis and improvement of his diverticulitis.  It continued to show fatty liver.  Have him please contact us if his symptoms do not get better over the next few weeks.  He will then need a colonoscopy after his symptoms resolve preferably a month after his symptoms resolved.

## 2019-02-12 NOTE — Telephone Encounter (Signed)
Pt notified of CT scan results.  

## 2019-02-24 NOTE — Telephone Encounter (Signed)
Per chart review tab pt went to ED.

## 2019-03-06 ENCOUNTER — Other Ambulatory Visit: Payer: Self-pay | Admitting: Family Medicine

## 2019-04-15 ENCOUNTER — Telehealth: Payer: Self-pay | Admitting: Gastroenterology

## 2019-04-15 NOTE — Telephone Encounter (Signed)
The patient will need to go to the ER if fevers or chills or any other signs of perforation. If not the we need to restart his antibiotics and have him seen by surgery as soon as they can get him in.

## 2019-04-15 NOTE — Telephone Encounter (Signed)
I spoke with pt and he feels he is having another diverticulitis flare. You had recommended him having a colonoscopy in a month after his symptoms resolved after his last CT scan in June. He started having abdominal pain like he had from his previous flare. No fever or nausea. Please advise.

## 2019-04-15 NOTE — Telephone Encounter (Signed)
Pt left vm to see if Dr. Allen Norris would give him a call back

## 2019-04-22 ENCOUNTER — Telehealth: Payer: Self-pay | Admitting: Gastroenterology

## 2019-04-22 ENCOUNTER — Other Ambulatory Visit: Payer: Self-pay

## 2019-04-22 NOTE — Telephone Encounter (Signed)
Spoke with the pt about his diverticulitis flare. He was given Augmentin the last time. Not by [place] and would like a different one because he didn't feel it went away. Please advise.

## 2019-04-22 NOTE — Telephone Encounter (Signed)
Patient called in stating he called & stating he has had a flare up and received call from nurse. She was to call him back to let him know if he was to take antibiotics,but he has not heard anything.  Please call & advise.

## 2019-04-23 ENCOUNTER — Other Ambulatory Visit: Payer: Self-pay

## 2019-04-23 MED ORDER — METRONIDAZOLE 500 MG PO TABS: 500.0000 mg | ORAL_TABLET | Freq: Three times a day (TID) | ORAL | 0 refills | Status: DC

## 2019-04-23 MED ORDER — CIPROFLOXACIN HCL 500 MG PO TABS: 500.0000 mg | ORAL_TABLET | Freq: Two times a day (BID) | ORAL | 0 refills | Status: DC

## 2019-04-23 NOTE — Telephone Encounter (Signed)
Left vm informing pt cipro and flagyl have been sent to [facility].

## 2019-04-23 NOTE — Telephone Encounter (Signed)
Pt left vm he is a pt of Dr. Allen Norris and was wondering if he was going to get his antibiotics

## 2019-05-31 ENCOUNTER — Other Ambulatory Visit: Payer: Self-pay | Admitting: Family Medicine

## 2019-06-02 NOTE — Telephone Encounter (Signed)
Please schedule early winter f/u and refill until then  

## 2019-06-02 NOTE — Telephone Encounter (Signed)
No recent or future appts., please advise  

## 2019-06-03 NOTE — Telephone Encounter (Signed)
Med refilled once and Carrie will reach out to pt to try and get appt scheduled  

## 2019-06-03 NOTE — Telephone Encounter (Signed)
I left a detailed message on patient's voice mail that Dr.Tower      just refilled pt's BP med and per Dr. Glori Bickers pt needs a f/u or CPE early winter, please schedule when able.

## 2019-08-04 ENCOUNTER — Other Ambulatory Visit: Payer: Self-pay | Admitting: Family Medicine

## 2019-08-20 ENCOUNTER — Other Ambulatory Visit: Payer: Self-pay | Admitting: Family Medicine

## 2019-09-19 ENCOUNTER — Other Ambulatory Visit: Payer: Self-pay

## 2019-09-19 DIAGNOSIS — K5732 Diverticulitis of large intestine without perforation or abscess without bleeding: Secondary | ICD-10-CM

## 2019-09-19 MED ORDER — AMOXICILLIN-POT CLAVULANATE 875-125 MG PO TABS: 1.0000 | ORAL_TABLET | Freq: Two times a day (BID) | ORAL | 0 refills | Status: DC

## 2019-09-24 ENCOUNTER — Other Ambulatory Visit: Payer: Self-pay

## 2019-09-24 ENCOUNTER — Ambulatory Visit: Payer: Commercial Managed Care - PPO | Admitting: Surgery

## 2019-09-24 ENCOUNTER — Encounter: Payer: Self-pay | Admitting: Surgery

## 2019-09-24 VITALS — BP 172/104 | HR 69 | Temp 97.7°F | Resp 14 | Ht 60.0 in | Wt 207.0 lb

## 2019-09-24 DIAGNOSIS — K5732 Diverticulitis of large intestine without perforation or abscess without bleeding: Secondary | ICD-10-CM | POA: Diagnosis not present

## 2019-09-24 NOTE — Progress Notes (Signed)
Surgical Consultation  09/24/2019  Albert Tran is an 54 y.o. male.   Chief Complaint  Patient presents with  . New Patient (Initial Visit)    Diverticuliits     HPI: 54 year old male seen in consultation at the request of Dr.Wohl for recurrent diverticulitis.  Over the last 8 months he has had 3 episodes of diverticulitis that have responded to antibiotic therapy.  He reports that initially he was having pain on the left side and now his pain is on the subcostal area bilaterally.  He reports that his pain is mild and is dull in nature and is more of a discomfort.  He does have significant change in bowel habits with some loose bowel movement , and small bowel movements. He is currently on antibiotics and has responded well.  He has had 2 CT scans that I have personally reviewed showing evidence of diverticulitis in the sigmoid colon no evidence of abscess no evidence of free air or perforation. His wife has multiple sclerosis and he is her caretaker. He is able to perform more than 4 METS of activity without any shortness or other chest pain.  He is a Freight forwarder for a [employer].  His laboratory results from few months ago revealed a normal CMP and a CBC.   Past Medical History:  Diagnosis Date  . Carpal tunnel syndrome   . DDD (degenerative disc disease)   . Fatty liver   . Gallstones   . Headache(784.0)    likely migraines  . Hypertension   . Hypertriglyceridemia   . Nosebleed    Hx of  . Plantar fasciitis   . Vertigo, intermittent     Past Surgical History:  Procedure Laterality Date  . HERNIA REPAIR     as a teen  . LAMINECTOMY     LS disc sx  . TONSILLECTOMY      Family History  Problem Relation Age of Onset  . Heart disease Mother        CAD  . Alcohol abuse Father   . Diabetes Father   . Heart disease Father        CAD  . Nephrolithiasis Father   . Obesity Brother   . Heart attack Brother 31  . Cancer Paternal Uncle        bladder CA  .  Heart disease Sister 49       CAD with 4 stents    Social History:  reports that he quit smoking about 32 years ago. He has quit using smokeless tobacco.  His smokeless tobacco use included chew. He reports that he does not drink alcohol or use drugs.  Allergies: No Known Allergies  Medications reviewed.     ROS Full ROS performed and is otherwise negative other than what is stated in the HPI    BP (!) 172/104   Pulse 69   Temp 97.7 F (36.5 C) (Temporal)   Resp 14   Ht 5' (1.524 m)   Wt 207 lb (93.9 kg)   SpO2 95%   BMI 40.43 kg/m   Physical Exam Vitals and nursing note reviewed. Exam conducted with a chaperone present.  Constitutional:      General: He is not in acute distress.    Appearance: Normal appearance. He is normal weight.  Eyes:     General: No scleral icterus.       Right eye: No discharge.        Left eye: No discharge.  Extraocular Movements: Extraocular movements intact.     Pupils: Pupils are equal, round, and reactive to light.  Cardiovascular:     Rate and Rhythm: Normal rate and regular rhythm.     Heart sounds: No murmur.  Pulmonary:     Effort: Pulmonary effort is normal. No respiratory distress.     Breath sounds: No stridor. No wheezing or rhonchi.  Abdominal:     General: Abdomen is flat. Bowel sounds are normal. There is no distension.     Palpations: Abdomen is soft. There is no mass.     Tenderness: There is no abdominal tenderness. There is no guarding or rebound.     Hernia: No hernia is present.  Musculoskeletal:        General: Normal range of motion.     Cervical back: Normal range of motion and neck supple. No rigidity or tenderness.  Skin:    General: Skin is warm and dry.     Capillary Refill: Capillary refill takes less than 2 seconds.  Neurological:     General: No focal deficit present.     Mental Status: He is alert and oriented to person, place, and time.  Psychiatric:        Mood and Affect: Mood normal.         Behavior: Behavior normal.        Thought Content: Thought content normal.        Judgment: Judgment normal.     Assessment/Plan: 1. Diverticulitis of colon I had a lengthy discussion with the patient about his disease process.  He does have recurrent diverticulitis and has had at least 3 episodes.  He has never had any complications so far.  I had a significant discussion with the patient regarding the disease process.  Natural history and the chance of recurrences.  Although we cannot identify the severity of the recurrence it is likely that he will continue to have the episodes.  Currently there is no need for emergent surgical intervention.  After extensive discussion with patient about his options of continue antibiotic therapy versus potential elective sigmoid colectomy he wishes to wait a couple of months and think about his options.  Obviously if he deteriorates or if he continues to have more recurrent attacks he is inclined to proceed with elective sigmoid colectomy. Currently no evidence of peritonitis no evidence of sepsis or any complicating features that will mandate hospitalization or repeat imaging. A copy of this report was sent to the referring provider  Sterling Big, MD [facility] General Surgeon

## 2019-09-24 NOTE — Patient Instructions (Addendum)
  Please see your follow up appointment listed below.   Diverticulitis:Diverticulitis is when small pockets in your large intestine (colon) get infected or swollen. This causes stomach pain and watery poop (diarrhea). These pouches are called diverticula. They form in people who have a condition called diverticulosis. Follow these instructions at home: Medicines  Take over-the-counter and prescription medicines only as told by your doctor. These include: ? Antibiotics. ? Pain medicines. ? Fiber pills. ? Probiotics. ? Stool softeners.  Do not drive or use heavy machinery while taking prescription pain medicine.  If you were prescribed an antibiotic, take it as told. Do not stop taking it even if you feel better. General instructions   Follow a diet as told by your doctor.  When you feel better, your doctor may tell you to change your diet. You may need to eat a lot of fiber. Fiber makes it easier to poop (have bowel movements). Healthy foods with fiber include: ? Berries. ? Beans. ? Lentils. ? Green vegetables.  Exercise 3 or more times a week. Aim for 30 minutes each time. Exercise enough to sweat and make your heart beat faster.  Keep all follow-up visits as told. This is important. You may need to have an exam of the large intestine. This is called a colonoscopy. Contact a doctor if:  Your pain does not get better.  You have a hard time eating or drinking.  You are not pooping like normal. Get help right away if:  Your pain gets worse.  Your problems do not get better.  Your problems get worse very fast.  You have a fever.  You throw up (vomit) more than one time.  You have poop that is: ? Bloody. ? Black. ? Tarry. Summary  Diverticulitis is when small pockets in your large intestine (colon) get infected or swollen.  Take medicines only as told by your doctor.  Follow a diet as told by your doctor. This information is not intended to replace advice given  to you by your health care provider. Make sure you discuss any questions you have with your health care provider. Document Revised: 08/03/2017 Document Reviewed: 09/07/2016 Elsevier Patient Education  2020 [employer].

## 2019-10-11 ENCOUNTER — Other Ambulatory Visit: Payer: Self-pay | Admitting: Family Medicine

## 2019-10-21 ENCOUNTER — Ambulatory Visit: Payer: Commercial Managed Care - PPO | Admitting: Gastroenterology

## 2019-10-21 ENCOUNTER — Other Ambulatory Visit: Payer: Self-pay

## 2019-10-21 ENCOUNTER — Encounter: Payer: Self-pay | Admitting: Gastroenterology

## 2019-10-21 VITALS — BP 153/84 | HR 76 | Temp 98.9°F | Ht 65.0 in | Wt 207.6 lb

## 2019-10-21 DIAGNOSIS — K5732 Diverticulitis of large intestine without perforation or abscess without bleeding: Secondary | ICD-10-CM | POA: Diagnosis not present

## 2019-10-21 NOTE — Progress Notes (Signed)
Primary Care Physician: Tower, Wynelle Fanny, MD  Primary Gastroenterologist:  Dr. Lucilla Lame  Chief Complaint  Patient presents with  . Follow up diverticulitis    HPI: Albert Tran is a 54 y.o. male here with his recurrent diverticulitis.  The patient has been seen by surgery recently for his diverticulitis and possible surgical removal of the sigmoid colon.  The patient states that he may be appoint with me and the surgeon at the same time and had deferred the surgery until he spoke to both of [place].  The patient states that he has not had a break from the diverticulitis long enough to undergo a colonoscopy.  He just recently got off of antibiotics again.  He is denying any abdominal pain at the present time.  There is no report of any fevers or chills.  He is not have any black stools or bloody stools.  Of note is the patient typically has pain in the right side of his abdomen when he has his attacks.  The patient has had 2 CT scans of the abdomen that have both confirm diverticulitis.  Past Medical History:  Diagnosis Date  . Carpal tunnel syndrome   . DDD (degenerative disc disease)   . Fatty liver   . Gallstones   . Headache(784.0)    likely migraines  . Hypertension   . Hypertriglyceridemia   . Nosebleed    Hx of  . Plantar fasciitis   . Vertigo, intermittent     Current Outpatient Medications  Medication Sig Dispense Refill  . lisinopril-hydrochlorothiazide (ZESTORETIC) 20-25 MG tablet Take 1 tablet by mouth daily. MUST SCHEDULE OFFICE OR VIRTUAL VISIT 90 tablet 0  . amoxicillin-clavulanate (AUGMENTIN) 875-125 MG tablet Take 1 tablet by mouth 2 (two) times daily. (Patient not taking: Reported on 10/21/2019) 20 tablet 0   No current facility-administered medications for this visit.    Allergies as of 10/21/2019  . (No Known Allergies)    ROS:  General: Negative for anorexia, weight loss, fever, chills, fatigue, weakness. ENT: Negative for hoarseness, difficulty  swallowing , nasal congestion. CV: Negative for chest pain, angina, palpitations, dyspnea on exertion, peripheral edema.  Respiratory: Negative for dyspnea at rest, dyspnea on exertion, cough, sputum, wheezing.  GI: See history of present illness. GU:  Negative for dysuria, hematuria, urinary incontinence, urinary frequency, nocturnal urination.  Endo: Negative for unusual weight change.    Physical Examination:   BP (!) 153/84   Pulse 76   Temp 98.9 F (37.2 C) (Oral)   Ht 5\' 5"  (1.651 m)   Wt 207 lb 9.6 oz (94.2 kg)   BMI 34.55 kg/m   General: Well-nourished, well-developed in no acute distress.  Eyes: No icterus. Conjunctivae pink. Extremities: No lower extremity edema. No clubbing or deformities. Neuro: Alert and oriented x 3.  Grossly intact. Skin: no jaundice.   Psych: Alert and cooperative, normal mood and affect.  Labs:    Imaging Studies: No results found.  Assessment and Plan:   Albert Tran is a 54 y.o. y/o male who comes in today with a report of wanting more information before he proceeds with any surgical intervention.  The patient has been told that he is at high risk of having a perforation or complication from a colonoscopy since he has had recurrent diverticulitis and has only been off of antibiotics for a very short amount of time.  The patient has been encouraged to follow-up with surgery and forego the colonoscopy at this  time with the risk of other pathology of the colon when he has the colonoscopy after the surgery.  The patient will contact surgery and contact me if he has any further questions.     Midge Minium, MD. Clement[facility] Graham    Note: This dictation was prepared with Dragon dictation along with smaller phrase technology. Any transcriptional errors that result from this process are unintentional.

## 2019-10-28 ENCOUNTER — Other Ambulatory Visit: Payer: Self-pay | Admitting: Family Medicine

## 2019-10-29 ENCOUNTER — Telehealth: Payer: Self-pay | Admitting: *Deleted

## 2019-10-29 NOTE — Telephone Encounter (Signed)
Patient called and has some questions about surgery, he didn't really want to tell me what his questions were and he didn't want to talk with a nurse or surgery scheduler. He wants to talk directly to you. Please give patient a call once you have a chance.

## 2019-10-30 ENCOUNTER — Telehealth: Payer: Self-pay | Admitting: Surgery

## 2019-10-30 NOTE — Telephone Encounter (Signed)
Called and spoke with patient. Patient states he would like to have his follow up appointment with Dr.Pabon sooner than 12/03/19. Patient is now scheduled with Dr.Pabon for 11/05/19 to discuss moving forward with surgery. Patient has verbalized understanding.

## 2019-10-30 NOTE — Telephone Encounter (Signed)
Patient is calling again asking for someone to return his call. Please call patient and advise.

## 2019-11-05 ENCOUNTER — Other Ambulatory Visit: Payer: Self-pay

## 2019-11-05 ENCOUNTER — Encounter: Payer: Self-pay | Admitting: Surgery

## 2019-11-05 ENCOUNTER — Ambulatory Visit: Payer: Commercial Managed Care - PPO | Admitting: Surgery

## 2019-11-05 VITALS — BP 162/97 | HR 77 | Temp 98.1°F | Resp 14 | Ht 65.0 in | Wt 205.0 lb

## 2019-11-05 DIAGNOSIS — K5732 Diverticulitis of large intestine without perforation or abscess without bleeding: Secondary | ICD-10-CM

## 2019-11-05 MED ORDER — BISACODYL 5 MG PO TBEC: DELAYED_RELEASE_TABLET | ORAL | 0 refills | Status: DC

## 2019-11-05 MED ORDER — POLYETHYLENE GLYCOL 3350 17 GM/SCOOP PO POWD: ORAL | 0 refills | Status: DC

## 2019-11-05 NOTE — Patient Instructions (Addendum)
We will get your scheduled for a CT abdomen and pelvis with contrast and a Barium Enema. We will have you follow up here after these tests with Dr Pabon.    You are scheduled for a CT abdomen and pelvis at [facility] on 11/20/19 at 2:00 pm. You will arrive there by 1:45 pm. You will need to pickup a prep kit a few days prior. You will have nothing to eat or drink for 4 hours prior.  You are scheduled for a Barium Enema at [facility] on Monday March 15th at 10:00 am. You will need to arrive there by 9:30 am. You will need to follow the prep instructions that you were given. We have sent in your prescriptions to your pharmacy for the Miralax and Dulcolax.   You will follow up here after your studies.    

## 2019-11-07 ENCOUNTER — Encounter: Payer: Self-pay | Admitting: Surgery

## 2019-11-07 NOTE — Progress Notes (Signed)
Outpatient Surgical Follow Up  11/07/2019  Albert Tran is an 54 y.o. male.   Chief Complaint  Patient presents with  . Follow-up    Diverticulitis    HPI: Albert Tran is a 54 year old male well-known to me with recurrent episode of diverticulitis.  More recently he has experienced right-sided abdominal pain.  He has had some frustrations because of his pain.  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  He reports that his pain is intermittent sharp and moderate intensity.  No specific alleviating or aggravating factors.  Last time we talked about potential elective sigmoid colectomy.  He continues to have these intermittent episodes. Have again personally reviewed the CT scan showing evidence of mild diverticulitis on the sigmoid colon. At his request because of privacy concerns he did not want any other members of our team within the exam room.   Past Medical History:  Diagnosis Date  . Carpal tunnel syndrome   . DDD (degenerative disc disease)   . Fatty liver   . Gallstones   . Headache(784.0)    likely migraines  . Hypertension   . Hypertriglyceridemia   . Nosebleed    Hx of  . Plantar fasciitis   . Vertigo, intermittent     Past Surgical History:  Procedure Laterality Date  . HERNIA REPAIR     as a teen  . LAMINECTOMY     LS disc sx  . TONSILLECTOMY      Family History  Problem Relation Age of Onset  . Heart disease Mother        CAD  . Alcohol abuse Father   . Diabetes Father   . Heart disease Father        CAD  . Nephrolithiasis Father   . Obesity Brother   . Heart attack Brother 48  . Cancer Paternal Uncle        bladder CA  . Heart disease Sister 62       CAD with 4 stents    Social History:  reports that he quit smoking about 32 years ago. He has quit using smokeless tobacco.  His smokeless tobacco use included chew. He reports that he does not drink alcohol or use drugs.  Allergies: No Known  Allergies  Medications reviewed.    ROS Full ROS performed and is otherwise negative other than what is stated in HPI   BP (!) 162/97   Pulse 77   Temp 98.1 F (36.7 C)   Resp 14   Ht 5\' 5"  (1.651 m)   Wt 205 lb (93 kg)   SpO2 96%   BMI 34.11 kg/m   Physical Exam Vitals and nursing note reviewed. Exam conducted with a chaperone present.  Constitutional:      General: He is not in acute distress.    Appearance: Normal appearance. He is normal weight.  Eyes:     General: No scleral icterus.       Right eye: No discharge.        Left eye: No discharge.  Cardiovascular:     Rate and Rhythm: Normal rate and regular rhythm.     Heart sounds: No murmur.  Pulmonary:     Effort: Pulmonary effort is normal. No respiratory distress.     Breath sounds: Normal breath sounds.  Abdominal:     General: Abdomen is flat. There is no distension.     Palpations: Abdomen is soft. There is no mass.  Tenderness: There is no abdominal tenderness. There is no left CVA tenderness, guarding or rebound.     Hernia: No hernia is present.  Musculoskeletal:        General: No swelling. Normal range of motion.     Cervical back: Normal range of motion and neck supple. No rigidity.  Skin:    General: Skin is warm and dry.     Capillary Refill: Capillary refill takes less than 2 seconds.  Neurological:     General: No focal deficit present.     Mental Status: He is alert and oriented to person, place, and time.  Psychiatric:        Mood and Affect: Mood normal.        Behavior: Behavior normal.        Thought Content: Thought content normal.        Judgment: Judgment normal.     Assessment/Plan: 54 year old male with recurrent noncomplicated diverticulitis.  Had an extensive discussion with the patient regarding the literature.  Literature suggest that sigmoid colectomy improves quality of life and decrease his episodes.  I have shared my thought process about this and he thinks that he  wishes to move forward.  Before we perform an elective sigmoid colectomy I would like to interrogate his colon and currently he is hesitant about having a colonoscopy alternatively I have offered him a barium enema.  I have also discussed with him about the importance of the CT scan to make sure the does not have a drainable collection. Aftr that we can talk about doing elective sigmoid colectomy.  He is not septic and does not need any emergent intervention at this time  Greater than 50% of the 40 minutes  visit was spent in counseling/coordination of care   Caroleen Hamman, MD [place] Surgeon

## 2019-11-17 ENCOUNTER — Ambulatory Visit
Admission: RE | Admit: 2019-11-17 | Discharge: 2019-11-17 | Disposition: A | Discharge status: Home / Self Care | Payer: Commercial Managed Care - PPO | Source: Ambulatory Visit | Attending: Surgery | Admitting: Surgery

## 2019-11-17 ENCOUNTER — Telehealth: Payer: Self-pay

## 2019-11-17 ENCOUNTER — Other Ambulatory Visit: Payer: Self-pay

## 2019-11-17 DIAGNOSIS — K5732 Diverticulitis of large intestine without perforation or abscess without bleeding: Secondary | ICD-10-CM | POA: Diagnosis present

## 2019-11-17 NOTE — Telephone Encounter (Signed)
Spoke with patient to notify him of his recent Barium Enema results per Dr.Pabon. Patient questioned if he still needed to have the CT scan that is scheduled for 12/02/19. Per Dr.Pabon will need to keep scheduled CT scan before moving forward with surgery. Patient was made aware of Dr.Pabon's recommendations and had no further questions.

## 2019-11-20 ENCOUNTER — Ambulatory Visit: Payer: Commercial Managed Care - PPO

## 2019-12-01 ENCOUNTER — Ambulatory Visit: Payer: Commercial Managed Care - PPO | Admitting: Surgery

## 2019-12-02 ENCOUNTER — Ambulatory Visit
Admission: RE | Admit: 2019-12-02 | Discharge: 2019-12-02 | Disposition: A | Discharge status: Home / Self Care | Payer: Commercial Managed Care - PPO | Source: Ambulatory Visit | Attending: Surgery | Admitting: Surgery

## 2019-12-02 ENCOUNTER — Other Ambulatory Visit: Payer: Self-pay

## 2019-12-02 DIAGNOSIS — K5732 Diverticulitis of large intestine without perforation or abscess without bleeding: Secondary | ICD-10-CM | POA: Insufficient documentation

## 2019-12-02 LAB — POCT I-STAT CREATININE: Creatinine, Ser: 1 mg/dL (ref 0.61–1.24)

## 2019-12-02 MED ORDER — IOHEXOL 300 MG/ML  SOLN
100.0000 mL | Freq: Once | INTRAMUSCULAR | Status: AC | PRN
  Administered 2019-12-02: 100 mL via INTRAVENOUS

## 2019-12-03 ENCOUNTER — Other Ambulatory Visit: Payer: Self-pay

## 2019-12-03 ENCOUNTER — Ambulatory Visit: Payer: Commercial Managed Care - PPO | Admitting: Surgery

## 2019-12-03 ENCOUNTER — Ambulatory Visit: Payer: Self-pay | Admitting: Surgery

## 2019-12-03 ENCOUNTER — Encounter: Payer: Self-pay | Admitting: Surgery

## 2019-12-03 VITALS — BP 167/99 | HR 64 | Temp 97.9°F | Resp 12 | Wt 205.2 lb

## 2019-12-03 DIAGNOSIS — K5732 Diverticulitis of large intestine without perforation or abscess without bleeding: Secondary | ICD-10-CM

## 2019-12-03 MED ORDER — ERYTHROMYCIN BASE 500 MG PO TABS: ORAL_TABLET | ORAL | 0 refills | Status: DC

## 2019-12-03 MED ORDER — BISACODYL 5 MG PO TBEC: DELAYED_RELEASE_TABLET | ORAL | 0 refills | Status: DC

## 2019-12-03 MED ORDER — NEOMYCIN SULFATE 500 MG PO TABS: ORAL_TABLET | ORAL | 0 refills | Status: DC

## 2019-12-03 MED ORDER — POLYETHYLENE GLYCOL 3350 17 GM/SCOOP PO POWD: ORAL | 0 refills | Status: DC

## 2019-12-03 NOTE — Patient Instructions (Addendum)
We have sent 4 medications to your local pharmacy so you can complete a Bowel Prep. You are given an instruction sheet on when to do this prep.   Our surgery scheduler will contact you to schedule your surgery. Please have the blue sheet available when she calls you.   Laparoscopic Colectomy Laparoscopic colectomy is surgery to remove part or all of the large intestine (colon). This procedure may be used to treat several conditions, including:  Inflammation and infection of the colon (diverticulitis).  Tumors or masses in the colon.  Inflammatory bowel disease, such as Crohn disease or ulcerative colitis. Colectomy is an option when symptoms cannot be controlled with medicines.  Bleeding from the colon that cannot be controlled by another method.  Blockage or obstruction of the colon. Tell a health care provider about:  Any allergies you have.  All medicines you are taking, including vitamins, herbs, eye drops, creams, and over-the-counter medicines.  Any problems you or family members have had with anesthetic medicines.  Any blood disorders you have.  Any surgeries you have had.  Any medical conditions you have. What are the risks? Generally, this is a safe procedure. However, problems may occur, including:  Infection.  Bleeding.  Allergic reactions to medicines or dyes.  Damage to other structures or organs.  Leaking from where the colon was sewn together.  Future blockage of the small intestines from scar tissue. Another surgery may be needed to repair this.  Needing to convert to an open procedure. Complications such as damage to other organs or excessive bleeding may require the surgeon to convert from a laparoscopic procedure to an open procedure. This involves making a larger incision in the abdomen. What happens before the procedure? Staying hydrated Follow instructions from your health care provider about hydration, which may include:  Up to 2 hours before  the procedure - you may continue to drink clear liquids, such as water, clear fruit juice, black coffee, and plain tea. Eating and drinking restrictions Follow instructions from your health care provider about eating and drinking, which may include:  8 hours before the procedure - stop eating heavy meals, meals with high fiber, or foods such as meat, fried foods, or fatty foods.  6 hours before the procedure - stop eating light meals or foods, such as toast or cereal.  6 hours before the procedure - stop drinking milk or drinks that contain milk.  2 hours before the procedure - stop drinking clear liquids. Medicines  Ask your health care provider about: ? Changing or stopping your regular medicines. This is especially important if you are taking diabetes medicines or blood thinners. ? Taking medicines such as aspirin and ibuprofen. These medicines can thin your blood. Do not take these medicines before your procedure if your health care provider instructs you not to.  You may be given antibiotic medicine to clean out bacteria from your colon. Follow the directions carefully and take the medicine at the correct time. General instructions  You may be prescribed an oral bowel prep to clean out your colon in preparation for the surgery: ? Follow instructions from your health care provider about how to do this. ? Do not eat or drink anything else after you have started the bowel prep, unless your health care provider tells you it is safe to do so.  Do not use any products that contain nicotine or tobacco, such as cigarettes and e-cigarettes. If you need help quitting, ask your health care provider. What happens  during the procedure?  To reduce your risk of infection: ? Your health care team will wash or sanitize their hands. ? Your skin will be washed with soap.  An IV tube will be inserted into one of your veins to deliver fluid and medication.  You will be given one of the  following: ? A medicine to help you relax (sedative). ? A medicine to make you fall asleep (general anesthetic).  Small monitors will be connected to your body. They will be used to check your heart, blood pressure, and oxygen level.  A breathing tube may be placed into your lungs during the procedure.  A thin, flexible tube (catheter) will be placed into your bladder to drain urine.  A tube may be placed through your nose and into your stomach to drain stomach fluids (nasogastric tube, or NG tube).  Your abdomen will be filled with air so it expands. This gives the surgeon more room to operate and makes your organs easier to see.  Several small cuts (incisions) will be made in your abdomen.  A thin, lighted tube with a tiny camera on the end (laparoscope) will be put through one of the small incisions. The camera on the laparoscope will send a picture to a computer screen in the operating room. This will give the surgeon a good view inside your abdomen.  Hollow tubes will be put through the other small incisions in your abdomen. The tools that are needed for the procedure will be put through these tubes.  Clamps or staples will be put on both ends of the diseased part of the colon.  The part of the intestine between the clamps or staples will be removed.  If possible, the ends of the healthy colon that remain will be stitched (sutured) or stapled together to allow your body to pass waste (stool).  Sometimes, the remaining colon cannot be stitched back together. If this is the case, a colostomy will be needed. If you need a colostomy: ? An opening to the outside of your body (stoma) will be made through your abdomen. ? The end of your colon will be brought to the opening. It will be stitched to the skin. ? A bag will be attached to the opening. Stool will drain into this removable bag. ? The colostomy may be temporary or permanent.  The incisions from the colectomy will be closed with  sutures or staples. The procedure may vary among health care providers and hospitals. What happens after the procedure?  Your blood pressure, heart rate, breathing rate, and blood oxygen level will be monitored until the medicines you were given have worn off.  You will receive fluids through an IV tube until your bowels start to work properly.  Once your bowels are working again, you will be given clear liquids first and then solid food as tolerated.  You will be given medicines to control your pain and nausea, if needed.  Do not drive for 24 hours if you were given a sedative. This information is not intended to replace advice given to you by your health care provider. Make sure you discuss any questions you have with your health care provider. Document Revised: 08/03/2017 Document Reviewed: 05/22/2016 Elsevier Patient Education  2020 [employer].

## 2019-12-05 NOTE — Progress Notes (Signed)
Outpatient Surgical Follow Up  12/05/2019  Albert Tran is an 54 y.o. male.   Chief Complaint  Patient presents with  . Follow-up     f/u for diverticulitis, BE and CT abd/pel     HPI:  54 year old male well-known to me with recurrent episode of diverticulitis.  He continue to endorse persistent   He has had some frustrations because of his pain.  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  He reports that his pain is intermittent sharp and moderate intensity.  No specific alleviating or aggravating factors.  Last time we talked about potential elective sigmoid colectomy.  He continues to have these intermittent episodes. Have again personally reviewed the CT scan showing evidence of mild diverticulitis on the sigmoid colon , I have also reviewed the BE and share the images with him. No other polyps or masses.  Past Medical History:  Diagnosis Date  . Carpal tunnel syndrome   . DDD (degenerative disc disease)   . Fatty liver   . Gallstones   . Headache(784.0)    likely migraines  . Hypertension   . Hypertriglyceridemia   . Nosebleed    Hx of  . Plantar fasciitis   . Vertigo, intermittent     Past Surgical History:  Procedure Laterality Date  . HERNIA REPAIR     as a teen  . LAMINECTOMY     LS disc sx  . TONSILLECTOMY      Family History  Problem Relation Age of Onset  . Heart disease Mother        CAD  . Alcohol abuse Father   . Diabetes Father   . Heart disease Father        CAD  . Nephrolithiasis Father   . Obesity Brother   . Heart attack Brother 48  . Cancer Paternal Uncle        bladder CA  . Heart disease Sister 5       CAD with 4 stents    Social History:  reports that he quit smoking about 32 years ago. He has quit using smokeless tobacco.  His smokeless tobacco use included chew. He reports that he does not drink alcohol or use drugs.  Allergies: No Known Allergies  Medications reviewed.    ROS Full ROS  performed and is otherwise negative other than what is stated in HPI   BP (!) 167/99   Pulse 64   Temp 97.9 F (36.6 C)   Resp 12   Wt 205 lb 3.2 oz (93.1 kg)   SpO2 96%   BMI 34.15 kg/m   Physical Exam Exam Vitals and nursing note reviewed. Exam conducted with a chaperone present.  Constitutional:      General: He is not in acute distress.    Appearance: Normal appearance. He is normal weight.  Eyes:     General: No scleral icterus.       Right eye: No discharge.        Left eye: No discharge.  Cardiovascular:     Rate and Rhythm: Normal rate and regular rhythm.     Heart sounds: No murmur.  Pulmonary:     Effort: Pulmonary effort is normal. No respiratory distress.     Breath sounds: Normal breath sounds.  Abdominal:     General: Abdomen is flat. There is no distension.     Palpations: Abdomen is soft. There is no mass.     Tenderness: There  is no abdominal tenderness. There is no left CVA tenderness, guarding or rebound.     Hernia: No hernia is present.  Musculoskeletal:        General: No swelling. Normal range of motion.     Cervical back: Normal range of motion and neck supple. No rigidity.  Skin:    General: Skin is warm and dry.     Capillary Refill: Capillary refill takes less than 2 seconds.  Neurological:     General: No focal deficit present.     Mental Status: He is alert and oriented to person, place, and time.  Psychiatric:        Mood and Affect: Mood normal.        Behavior: Behavior normal.        Thought Content: Thought content normal.        Judgment: Judgment normal.     Assessment/Plan: 54 year old male with recurrent noncomplicated diverticulitis. I Had an extensive discussion with the patient regarding the literature.  Literature suggest that sigmoid colectomy improves quality of life and decrease his episodes.  I have shared my thought process about this and he thinks that he wishes to move forward. He is in agreement with elective  sigmoid colectomy , he would be a good candidate for robotic approach. I have d/w the pt in detail about the risks, benefits and possible complications including but not limited to anastomotic leak, ostomy, recurrence, injury to adjacent structures, chronic pain. HE wishes to proceed.      Greater than 50% of the 40 minutes  visit was spent in counseling/coordination of care   Caroleen Hamman, MD [place] [facility]

## 2019-12-15 ENCOUNTER — Telehealth: Payer: Self-pay | Admitting: Surgery

## 2019-12-15 NOTE — Telephone Encounter (Signed)
Pt has been advised of Pre-Admission date/time, COVID Testing date and Surgery date.  Surgery Date: 01/06/20 Preadmission Testing Date: 12/31/19 (phone 8a-1p) Covid Testing Date: 01/02/20 - patient advised to go to [facility] ([address] [place]) between 8a-1p   Patient has been made aware to call [phone], between 1-3:00pm the day before surgery, to find out what time to arrive for surgery.    

## 2019-12-31 ENCOUNTER — Other Ambulatory Visit: Payer: Self-pay

## 2019-12-31 ENCOUNTER — Other Ambulatory Visit
Admission: RE | Admit: 2019-12-31 | Discharge: 2019-12-31 | Disposition: A | Discharge status: Home / Self Care | Payer: Commercial Managed Care - PPO | Source: Ambulatory Visit | Attending: Surgery | Admitting: Surgery

## 2019-12-31 ENCOUNTER — Telehealth: Payer: Self-pay | Admitting: Surgery

## 2019-12-31 DIAGNOSIS — Z01812 Encounter for preprocedural laboratory examination: Secondary | ICD-10-CM | POA: Insufficient documentation

## 2019-12-31 DIAGNOSIS — I10 Essential (primary) hypertension: Secondary | ICD-10-CM | POA: Insufficient documentation

## 2019-12-31 HISTORY — DX: Family history of other specified conditions: Z84.89

## 2019-12-31 NOTE — Patient Instructions (Addendum)
Your procedure is scheduled on: Tuesday 12/08/19.  Report to DAY SURGERY DEPARTMENT LOCATED ON 2ND FLOOR MEDICAL MALL ENTRANCE. To find out your arrival time please call [phone] between 1PM - 3PM on Monday 12/06/19.   Remember: Instructions that are not followed completely may result in serious medical risk, up to and including death, or upon the discretion of your surgeon and anesthesiologist your surgery may need to be rescheduled.      _X__ 1. Follow Dr. Hurman Horn instructions for the bowel prep. Dr. Everlene Farrier  sent 4 medications to your local pharmacy so you can complete a Bowel Prep. You are given an instruction sheet on when to do this prep.   bisacodyl 5 MG EC tablet Commonly known as: DULCOLAX Take all 4 tablets at 8 am the morning prior to your Surgery.  erythromycin base 500 MG tablet Commonly known as: E-MYCIN Take 2 tablets at 8am, 2 tablets at 2pm, and 2 tablets at 8pm the day prior to surgery.  neomycin 500 MG tablet Commonly known as: MYCIFRADIN Take 2 tablet at 8am, take 2 tablets at 2pm, and take 2 tablets at 8pm the day prior to your surgery  polyethylene glycol powder 17 GM/SCOOP powder Commonly known as: MiraLax Mix full container in 64 ounces of Gatorade or other clear liquid  IN ADDITION: REMEMBER TO NOT EAT ANY SOLID FOOD AFTER MIDNIGHT THE NIGHT PRIOR TO YOUR SURGERY.       You may drink clear liquids up to 2 hours                  before you are scheduled to arrive for your surgery- DO NOT drink clear                 liquids within 2 hours of the start of your surgery.                 Clear Liquids include:  water, apple juice without pulp, clear carbohydrate                 drink such as Clearfast or Gatorade, Black Coffee or Tea (Do not add                 anything to coffee or tea).   __X__2.  On the morning of surgery brush your teeth with toothpaste and water, you may rinse your mouth with mouthwash if you wish.  Do not swallow any toothpaste or  mouthwash.       _X__ 3.  No Alcohol for 24 hours before or after surgery.     _X__ 4.  Do Not Smoke or use e-cigarettes For 24 Hours Prior to Your Surgery.                 Do not use any chewable tobacco products for at least 6 hours prior to                 surgery.   __X__5.  Notify your doctor if there is any change in your medical condition      (cold, fever, infections).       Do not wear jewelry, make-up, hairpins, clips or nail polish. Do not wear lotions, powders, or perfumes.  Do not shave 48 hours prior to surgery. Men may shave face and neck. Do not bring valuables to the hospital.      The [facility] is not responsible for any belongings or valuables.   Contacts, dentures/partials or body  piercings may not be worn into surgery. Bring a case for your contacts, glasses or hearing aids, a denture cup will be supplied.   For patients admitted to the hospital, discharge time is determined by your treatment team.      __X__ Take these medicines the morning of surgery with A SIP OF WATER:     1. NONE    ____ Fleet Enema (as directed)    __X__ Use CHG Soap as directed   __X__ Stop Anti-inflammatories 7 days before surgery such as Advil, Ibuprofen, Motrin, BC or Goodies Powder, Naprosyn, Naproxen, Aleve, Aspirin, Meloxicam. May take Tylenol if needed for pain or discomfort.    __X__ Stop Omega-3 Fatty Acids (FISH OIL PO) today. Don't start taking any new herbal supplements or vitamins prior to your procedure.

## 2019-12-31 NOTE — Telephone Encounter (Signed)
Patient is calling and has some questions and is asking if one of the nurses would give him a call soon as possible. Please call patient and advise.

## 2019-12-31 NOTE — Telephone Encounter (Signed)
Called patient back, he did not want to speak with me. He wanted to speak to Dr Everlene Farrier personally.   Dr Everlene Farrier took the call and spoke to patient.

## 2020-01-02 ENCOUNTER — Other Ambulatory Visit: Payer: Self-pay

## 2020-01-02 ENCOUNTER — Encounter
Admission: RE | Admit: 2020-01-02 | Discharge: 2020-01-02 | Disposition: A | Discharge status: Home / Self Care | Payer: Commercial Managed Care - PPO | Source: Ambulatory Visit | Attending: Surgery | Admitting: Surgery

## 2020-01-02 DIAGNOSIS — Z01812 Encounter for preprocedural laboratory examination: Secondary | ICD-10-CM | POA: Insufficient documentation

## 2020-01-02 DIAGNOSIS — Z01810 Encounter for preprocedural cardiovascular examination: Secondary | ICD-10-CM | POA: Insufficient documentation

## 2020-01-02 DIAGNOSIS — I10 Essential (primary) hypertension: Secondary | ICD-10-CM | POA: Diagnosis not present

## 2020-01-02 DIAGNOSIS — Z20822 Contact with and (suspected) exposure to covid-19: Secondary | ICD-10-CM | POA: Diagnosis not present

## 2020-01-02 LAB — BASIC METABOLIC PANEL
Anion gap: 10 (ref 5–15)
BUN: 16 mg/dL (ref 6–20)
CO2: 27 mmol/L (ref 22–32)
Calcium: 9.4 mg/dL (ref 8.9–10.3)
Chloride: 103 mmol/L (ref 98–111)
Creatinine, Ser: 0.78 mg/dL (ref 0.61–1.24)
GFR calc Af Amer: >60 mL/min (ref >60)
GFR calc non Af Amer: >60 mL/min (ref >60)
Glucose, Bld: 84 mg/dL (ref 70–99)
Potassium: 3.8 mmol/L (ref 3.5–5.1)
Sodium: 140 mmol/L (ref 135–145)

## 2020-01-02 LAB — CBC
HCT: 41 % (ref 39.0–52.0)
Hemoglobin: 14 g/dL (ref 13.0–17.0)
MCH: 30.1 pg (ref 26.0–34.0)
MCHC: 34.1 g/dL (ref 30.0–36.0)
MCV: 88.2 fL (ref 80.0–100.0)
Platelets: 276 10*3/uL (ref 150–400)
RBC: 4.65 MIL/uL (ref 4.22–5.81)
RDW: 12.4 % (ref 11.5–15.5)
WBC: 7.7 10*3/uL (ref 4.0–10.5)
nRBC: 0 % (ref 0.0–0.2)

## 2020-01-02 LAB — TYPE AND SCREEN
ABO/RH(D): B POS
Antibody Screen: NEGATIVE

## 2020-01-02 LAB — SARS CORONAVIRUS 2 (TAT 6-24 HRS): SARS Coronavirus 2: NEGATIVE

## 2020-01-05 ENCOUNTER — Telehealth: Payer: Self-pay | Admitting: *Deleted

## 2020-01-05 MED ORDER — SODIUM CHLORIDE 0.9 % IV SOLN
2.0000 g | INTRAVENOUS | Status: AC
  Administered 2020-01-06 (×2): 2 g via INTRAVENOUS

## 2020-01-05 NOTE — Telephone Encounter (Signed)
Patient called and is having surgery tomorrow and he needs to CPT code. He stated that his insurance company is telling him that the anesthesia that we use is out of network. Please call and advise

## 2020-01-06 ENCOUNTER — Inpatient Hospital Stay
Admission: RE | Admit: 2020-01-06 | Discharge: 2020-01-08 | DRG: 331 | Disposition: A | Discharge status: Home / Self Care | Payer: Commercial Managed Care - PPO | Attending: Surgery | Admitting: Surgery

## 2020-01-06 ENCOUNTER — Inpatient Hospital Stay: Payer: Commercial Managed Care - PPO | Admitting: Anesthesiology

## 2020-01-06 ENCOUNTER — Encounter
Admission: RE | Disposition: A | Discharge status: Home / Self Care | Payer: Self-pay | Source: Home / Self Care | Attending: Surgery

## 2020-01-06 ENCOUNTER — Encounter: Payer: Self-pay | Admitting: Surgery

## 2020-01-06 ENCOUNTER — Other Ambulatory Visit: Payer: Self-pay

## 2020-01-06 DIAGNOSIS — I10 Essential (primary) hypertension: Secondary | ICD-10-CM | POA: Diagnosis present

## 2020-01-06 DIAGNOSIS — K5732 Diverticulitis of large intestine without perforation or abscess without bleeding: Secondary | ICD-10-CM

## 2020-01-06 DIAGNOSIS — Z8052 Family history of malignant neoplasm of bladder: Secondary | ICD-10-CM | POA: Diagnosis not present

## 2020-01-06 DIAGNOSIS — K5792 Diverticulitis of intestine, part unspecified, without perforation or abscess without bleeding: Secondary | ICD-10-CM | POA: Diagnosis present

## 2020-01-06 DIAGNOSIS — Z87891 Personal history of nicotine dependence: Secondary | ICD-10-CM

## 2020-01-06 DIAGNOSIS — E781 Pure hyperglyceridemia: Secondary | ICD-10-CM | POA: Diagnosis present

## 2020-01-06 DIAGNOSIS — K4090 Unilateral inguinal hernia, without obstruction or gangrene, not specified as recurrent: Secondary | ICD-10-CM | POA: Diagnosis present

## 2020-01-06 DIAGNOSIS — K760 Fatty (change of) liver, not elsewhere classified: Secondary | ICD-10-CM | POA: Diagnosis present

## 2020-01-06 DIAGNOSIS — Z8249 Family history of ischemic heart disease and other diseases of the circulatory system: Secondary | ICD-10-CM

## 2020-01-06 DIAGNOSIS — Z833 Family history of diabetes mellitus: Secondary | ICD-10-CM | POA: Diagnosis not present

## 2020-01-06 DIAGNOSIS — Z811 Family history of alcohol abuse and dependence: Secondary | ICD-10-CM | POA: Diagnosis not present

## 2020-01-06 DIAGNOSIS — F419 Anxiety disorder, unspecified: Secondary | ICD-10-CM | POA: Diagnosis present

## 2020-01-06 DIAGNOSIS — D72829 Elevated white blood cell count, unspecified: Secondary | ICD-10-CM | POA: Diagnosis present

## 2020-01-06 DIAGNOSIS — Z9049 Acquired absence of other specified parts of digestive tract: Secondary | ICD-10-CM

## 2020-01-06 LAB — CBC
HCT: 39.6 % (ref 39.0–52.0)
Hemoglobin: 13.9 g/dL (ref 13.0–17.0)
MCH: 30.4 pg (ref 26.0–34.0)
MCHC: 35.1 g/dL (ref 30.0–36.0)
MCV: 86.7 fL (ref 80.0–100.0)
Platelets: 238 10*3/uL (ref 150–400)
RBC: 4.57 MIL/uL (ref 4.22–5.81)
RDW: 12.3 % (ref 11.5–15.5)
WBC: 13.7 10*3/uL — ABNORMAL HIGH (ref 4.0–10.5)
nRBC: 0 % (ref 0.0–0.2)

## 2020-01-06 LAB — CREATININE, SERUM
Creatinine, Ser: 1.33 mg/dL — ABNORMAL HIGH (ref 0.61–1.24)
GFR calc Af Amer: >60 mL/min (ref >60)
GFR calc non Af Amer: >60 mL/min (ref >60)

## 2020-01-06 LAB — ABO/RH: ABO/RH(D): B POS

## 2020-01-06 LAB — HIV ANTIBODY (ROUTINE TESTING W REFLEX): HIV Screen 4th Generation wRfx: NONREACTIVE

## 2020-01-06 SURGERY — COLECTOMY, PARTIAL, ROBOT-ASSISTED, LAPAROSCOPIC
Anesthesia: General

## 2020-01-06 MED ORDER — MIDAZOLAM HCL 2 MG/2ML IJ SOLN
INTRAMUSCULAR | Status: AC
  Filled 2020-01-06: qty 2

## 2020-01-06 MED ORDER — EPINEPHRINE PF 1 MG/ML IJ SOLN
INTRAMUSCULAR | Status: AC
  Filled 2020-01-06: qty 1

## 2020-01-06 MED ORDER — ALVIMOPAN 12 MG PO CAPS
ORAL_CAPSULE | ORAL | Status: AC
  Administered 2020-01-06: 12 mg via ORAL
  Filled 2020-01-06: qty 1

## 2020-01-06 MED ORDER — CHLORHEXIDINE GLUCONATE CLOTH 2 % EX PADS: 6.0000 | MEDICATED_PAD | Freq: Every day | CUTANEOUS | Status: DC

## 2020-01-06 MED ORDER — OXYCODONE HCL 5 MG PO TABS: 5.0000 mg | ORAL_TABLET | ORAL | Status: DC | PRN

## 2020-01-06 MED ORDER — FAMOTIDINE 20 MG PO TABS
ORAL_TABLET | ORAL | Status: AC
  Filled 2020-01-06: qty 1

## 2020-01-06 MED ORDER — MORPHINE SULFATE (PF) 2 MG/ML IV SOLN: 2.0000 mg | INTRAVENOUS | Status: DC | PRN

## 2020-01-06 MED ORDER — SODIUM CHLORIDE 0.9 % IV SOLN
INTRAVENOUS | Status: DC | PRN
  Administered 2020-01-06: 12:00:00 70 mL

## 2020-01-06 MED ORDER — PROCHLORPERAZINE MALEATE 10 MG PO TABS
10.0000 mg | ORAL_TABLET | Freq: Four times a day (QID) | ORAL | Status: DC | PRN
  Filled 2020-01-06: qty 1

## 2020-01-06 MED ORDER — HEPARIN SODIUM (PORCINE) 5000 UNIT/ML IJ SOLN: 5000.0000 [IU] | Freq: Once | INTRAMUSCULAR | Status: AC

## 2020-01-06 MED ORDER — SODIUM CHLORIDE 0.9 % IV SOLN
INTRAVENOUS | Status: AC
  Filled 2020-01-06: qty 2

## 2020-01-06 MED ORDER — KETAMINE HCL 10 MG/ML IJ SOLN
INTRAMUSCULAR | Status: DC | PRN
Start: 2020-01-06 — End: 2020-01-06
  Administered 2020-01-06: 25 mg via INTRAVENOUS
  Administered 2020-01-06: 50 mg via INTRAVENOUS
  Administered 2020-01-06: 30 mg via INTRAVENOUS
  Administered 2020-01-06: 25 mg via INTRAVENOUS

## 2020-01-06 MED ORDER — ALBUMIN HUMAN 5 % IV SOLN
INTRAVENOUS | Status: AC
  Filled 2020-01-06: qty 250

## 2020-01-06 MED ORDER — HYDRALAZINE HCL 20 MG/ML IJ SOLN
10.0000 mg | Freq: Once | INTRAMUSCULAR | Status: AC
  Administered 2020-01-06: 10 mg via INTRAVENOUS

## 2020-01-06 MED ORDER — LIDOCAINE IN D5W 4-5 MG/ML-% IV SOLN
INTRAVENOUS | Status: DC | PRN
  Administered 2020-01-06: 1.66 mg/min via INTRAVENOUS

## 2020-01-06 MED ORDER — LACTATED RINGERS IV SOLN: INTRAVENOUS | Status: DC

## 2020-01-06 MED ORDER — HYDRALAZINE HCL 20 MG/ML IJ SOLN
INTRAMUSCULAR | Status: AC
  Filled 2020-01-06: qty 1

## 2020-01-06 MED ORDER — KETAMINE HCL 50 MG/ML IJ SOLN
INTRAMUSCULAR | Status: AC
  Filled 2020-01-06: qty 10

## 2020-01-06 MED ORDER — LIDOCAINE HCL (PF) 2 % IJ SOLN
INTRAMUSCULAR | Status: AC
  Filled 2020-01-06: qty 15

## 2020-01-06 MED ORDER — ALVIMOPAN 12 MG PO CAPS: 12.0000 mg | ORAL_CAPSULE | ORAL | Status: DC

## 2020-01-06 MED ORDER — SUCCINYLCHOLINE CHLORIDE 200 MG/10ML IV SOSY
PREFILLED_SYRINGE | INTRAVENOUS | Status: AC
  Filled 2020-01-06: qty 20

## 2020-01-06 MED ORDER — SUGAMMADEX SODIUM 200 MG/2ML IV SOLN: INTRAVENOUS | Status: DC | PRN

## 2020-01-06 MED ORDER — ROCURONIUM BROMIDE 100 MG/10ML IV SOLN
INTRAVENOUS | Status: DC | PRN
  Administered 2020-01-06 (×2): 20 mg via INTRAVENOUS
  Administered 2020-01-06: 25 mg via INTRAVENOUS
  Administered 2020-01-06: 20 mg via INTRAVENOUS
  Administered 2020-01-06: 50 mg via INTRAVENOUS

## 2020-01-06 MED ORDER — FAMOTIDINE 20 MG PO TABS
20.0000 mg | ORAL_TABLET | Freq: Once | ORAL | Status: AC
  Administered 2020-01-06: 20 mg via ORAL

## 2020-01-06 MED ORDER — HEPARIN SODIUM (PORCINE) 5000 UNIT/ML IJ SOLN
INTRAMUSCULAR | Status: AC
  Administered 2020-01-06: 07:00:00 5000 [IU] via SUBCUTANEOUS
  Filled 2020-01-06: qty 1

## 2020-01-06 MED ORDER — ONDANSETRON 4 MG PO TBDP: 4.0000 mg | ORAL_TABLET | Freq: Four times a day (QID) | ORAL | Status: DC | PRN

## 2020-01-06 MED ORDER — SODIUM CHLORIDE (PF) 0.9 % IJ SOLN
INTRAMUSCULAR | Status: AC
  Filled 2020-01-06: qty 50

## 2020-01-06 MED ORDER — MIDAZOLAM HCL 2 MG/2ML IJ SOLN
INTRAMUSCULAR | Status: DC | PRN
  Administered 2020-01-06: 2 mg via INTRAVENOUS

## 2020-01-06 MED ORDER — CHLORHEXIDINE GLUCONATE CLOTH 2 % EX PADS
6.0000 | MEDICATED_PAD | Freq: Once | CUTANEOUS | Status: AC
  Administered 2020-01-05: 6 via TOPICAL

## 2020-01-06 MED ORDER — KETOROLAC TROMETHAMINE 30 MG/ML IJ SOLN
30.0000 mg | Freq: Four times a day (QID) | INTRAMUSCULAR | Status: DC
  Administered 2020-01-06 – 2020-01-07 (×6): 30 mg via INTRAVENOUS
  Filled 2020-01-06 (×7): qty 1

## 2020-01-06 MED ORDER — FENTANYL CITRATE (PF) 100 MCG/2ML IJ SOLN
INTRAMUSCULAR | Status: AC
  Filled 2020-01-06: qty 2

## 2020-01-06 MED ORDER — SUGAMMADEX SODIUM 200 MG/2ML IV SOLN
INTRAVENOUS | Status: DC | PRN
  Administered 2020-01-06: 400 mg via INTRAVENOUS

## 2020-01-06 MED ORDER — LIDOCAINE 5 % EX PTCH
1.0000 | MEDICATED_PATCH | Freq: Two times a day (BID) | CUTANEOUS | Status: DC
  Filled 2020-01-06 (×5): qty 1

## 2020-01-06 MED ORDER — SEVOFLURANE IN SOLN
RESPIRATORY_TRACT | Status: AC
  Filled 2020-01-06: qty 250

## 2020-01-06 MED ORDER — PHENYLEPHRINE HCL (PRESSORS) 10 MG/ML IV SOLN
INTRAVENOUS | Status: DC | PRN
  Administered 2020-01-06: 200 ug via INTRAVENOUS
  Administered 2020-01-06 (×4): 100 ug via INTRAVENOUS
  Administered 2020-01-06: 200 ug via INTRAVENOUS

## 2020-01-06 MED ORDER — FENTANYL CITRATE (PF) 100 MCG/2ML IJ SOLN: 25.0000 ug | INTRAMUSCULAR | Status: DC | PRN

## 2020-01-06 MED ORDER — GABAPENTIN 300 MG PO CAPS: 300.0000 mg | ORAL_CAPSULE | ORAL | Status: AC

## 2020-01-06 MED ORDER — ONDANSETRON HCL 4 MG/2ML IJ SOLN
INTRAMUSCULAR | Status: DC | PRN
  Administered 2020-01-06: 4 mg via INTRAVENOUS

## 2020-01-06 MED ORDER — ONDANSETRON HCL 4 MG/2ML IJ SOLN: 4.0000 mg | Freq: Four times a day (QID) | INTRAMUSCULAR | Status: DC | PRN

## 2020-01-06 MED ORDER — ALBUMIN HUMAN 5 % IV SOLN
INTRAVENOUS | Status: DC | PRN
Start: 2020-01-06 — End: 2020-01-06

## 2020-01-06 MED ORDER — DEXAMETHASONE SODIUM PHOSPHATE 10 MG/ML IJ SOLN
INTRAMUSCULAR | Status: DC | PRN
  Administered 2020-01-06: 10 mg via INTRAVENOUS

## 2020-01-06 MED ORDER — ONDANSETRON HCL 4 MG/2ML IJ SOLN
INTRAMUSCULAR | Status: AC
  Filled 2020-01-06: qty 2

## 2020-01-06 MED ORDER — BUPIVACAINE-EPINEPHRINE 0.25% -1:200000 IJ SOLN
INTRAMUSCULAR | Status: DC | PRN
  Administered 2020-01-06: 30 mL

## 2020-01-06 MED ORDER — CELECOXIB 200 MG PO CAPS: 200.0000 mg | ORAL_CAPSULE | ORAL | Status: AC

## 2020-01-06 MED ORDER — DEXAMETHASONE SODIUM PHOSPHATE 10 MG/ML IJ SOLN
INTRAMUSCULAR | Status: AC
  Filled 2020-01-06: qty 1

## 2020-01-06 MED ORDER — ACETAMINOPHEN 500 MG PO TABS
ORAL_TABLET | ORAL | Status: AC
  Administered 2020-01-06: 1000 mg via ORAL
  Filled 2020-01-06: qty 2

## 2020-01-06 MED ORDER — PROPOFOL 10 MG/ML IV BOLUS
INTRAVENOUS | Status: DC | PRN
  Administered 2020-01-06: 200 mg via INTRAVENOUS

## 2020-01-06 MED ORDER — LACTATED RINGERS IV SOLN
INTRAVENOUS | Status: DC | PRN
Start: 2020-01-06 — End: 2020-01-06

## 2020-01-06 MED ORDER — DIPHENHYDRAMINE HCL 12.5 MG/5ML PO ELIX
12.5000 mg | ORAL_SOLUTION | Freq: Four times a day (QID) | ORAL | Status: DC | PRN
  Filled 2020-01-06: qty 5

## 2020-01-06 MED ORDER — FENTANYL CITRATE (PF) 100 MCG/2ML IJ SOLN
INTRAMUSCULAR | Status: DC | PRN
  Administered 2020-01-06: 100 ug via INTRAVENOUS

## 2020-01-06 MED ORDER — ALVIMOPAN 12 MG PO CAPS: 12.0000 mg | ORAL_CAPSULE | ORAL | Status: AC

## 2020-01-06 MED ORDER — PHENYLEPHRINE HCL (PRESSORS) 10 MG/ML IV SOLN
INTRAVENOUS | Status: AC
  Filled 2020-01-06: qty 1

## 2020-01-06 MED ORDER — EPHEDRINE 5 MG/ML INJ
INTRAVENOUS | Status: AC
  Filled 2020-01-06: qty 10

## 2020-01-06 MED ORDER — BUPIVACAINE LIPOSOME 1.3 % IJ SUSP
INTRAMUSCULAR | Status: AC
  Filled 2020-01-06: qty 20

## 2020-01-06 MED ORDER — DEXMEDETOMIDINE HCL IN NACL 80 MCG/20ML IV SOLN
INTRAVENOUS | Status: AC
  Filled 2020-01-06: qty 20

## 2020-01-06 MED ORDER — PROCHLORPERAZINE EDISYLATE 10 MG/2ML IJ SOLN: 5.0000 mg | Freq: Four times a day (QID) | INTRAMUSCULAR | Status: DC | PRN

## 2020-01-06 MED ORDER — HYDRALAZINE HCL 20 MG/ML IJ SOLN: 10.0000 mg | INTRAMUSCULAR | Status: DC | PRN

## 2020-01-06 MED ORDER — ACETAMINOPHEN 500 MG PO TABS: 1000.0000 mg | ORAL_TABLET | ORAL | Status: AC

## 2020-01-06 MED ORDER — KETOROLAC TROMETHAMINE 30 MG/ML IJ SOLN
INTRAMUSCULAR | Status: AC
  Filled 2020-01-06: qty 1

## 2020-01-06 MED ORDER — ROCURONIUM BROMIDE 10 MG/ML (PF) SYRINGE
PREFILLED_SYRINGE | INTRAVENOUS | Status: AC
  Filled 2020-01-06: qty 10

## 2020-01-06 MED ORDER — INDOCYANINE GREEN 25 MG IV SOLR
INTRAVENOUS | Status: DC | PRN
  Administered 2020-01-06: 2.5 mg via INTRAVENOUS

## 2020-01-06 MED ORDER — ALVIMOPAN 12 MG PO CAPS
12.0000 mg | ORAL_CAPSULE | Freq: Two times a day (BID) | ORAL | Status: DC
  Administered 2020-01-06: 12 mg via ORAL
  Filled 2020-01-06 (×2): qty 1

## 2020-01-06 MED ORDER — DIPHENHYDRAMINE HCL 50 MG/ML IJ SOLN: 12.5000 mg | Freq: Four times a day (QID) | INTRAMUSCULAR | Status: DC | PRN

## 2020-01-06 MED ORDER — CELECOXIB 200 MG PO CAPS
ORAL_CAPSULE | ORAL | Status: AC
  Administered 2020-01-06: 200 mg via ORAL
  Filled 2020-01-06: qty 1

## 2020-01-06 MED ORDER — ROCURONIUM BROMIDE 10 MG/ML (PF) SYRINGE
PREFILLED_SYRINGE | INTRAVENOUS | Status: AC
  Filled 2020-01-06: qty 20

## 2020-01-06 MED ORDER — LIDOCAINE HCL (CARDIAC) PF 100 MG/5ML IV SOSY
PREFILLED_SYRINGE | INTRAVENOUS | Status: DC | PRN
  Administered 2020-01-06: 100 mg via INTRAVENOUS

## 2020-01-06 MED ORDER — CHLORHEXIDINE GLUCONATE CLOTH 2 % EX PADS
6.0000 | MEDICATED_PAD | Freq: Once | CUTANEOUS | Status: AC
  Administered 2020-01-06: 6 via TOPICAL

## 2020-01-06 MED ORDER — BUPIVACAINE HCL (PF) 0.5 % IJ SOLN
INTRAMUSCULAR | Status: AC
  Filled 2020-01-06: qty 30

## 2020-01-06 MED ORDER — GABAPENTIN 300 MG PO CAPS
ORAL_CAPSULE | ORAL | Status: AC
  Administered 2020-01-06: 300 mg via ORAL
  Filled 2020-01-06: qty 1

## 2020-01-06 MED ORDER — ONDANSETRON HCL 4 MG/2ML IJ SOLN: 4.0000 mg | Freq: Once | INTRAMUSCULAR | Status: DC | PRN

## 2020-01-06 MED ORDER — ENOXAPARIN SODIUM 40 MG/0.4ML ~~LOC~~ SOLN
40.0000 mg | SUBCUTANEOUS | Status: DC
  Administered 2020-01-07 – 2020-01-08 (×2): 40 mg via SUBCUTANEOUS
  Filled 2020-01-06 (×2): qty 0.4

## 2020-01-06 MED ORDER — PANTOPRAZOLE SODIUM 40 MG IV SOLR
40.0000 mg | Freq: Every day | INTRAVENOUS | Status: DC
  Administered 2020-01-06 – 2020-01-07 (×2): 40 mg via INTRAVENOUS
  Filled 2020-01-06 (×2): qty 40

## 2020-01-06 MED ORDER — EPHEDRINE SULFATE 50 MG/ML IJ SOLN
INTRAMUSCULAR | Status: DC | PRN
  Administered 2020-01-06 (×2): 15 mg via INTRAVENOUS
  Administered 2020-01-06: 10 mg via INTRAVENOUS

## 2020-01-06 MED ORDER — SODIUM CHLORIDE 0.9 % IV SOLN: INTRAVENOUS | Status: DC

## 2020-01-06 MED ORDER — AMLODIPINE BESYLATE 5 MG PO TABS: 5.0000 mg | ORAL_TABLET | Freq: Every day | ORAL | Status: DC | PRN

## 2020-01-06 MED ORDER — PROPOFOL 10 MG/ML IV BOLUS
INTRAVENOUS | Status: AC
  Filled 2020-01-06: qty 20

## 2020-01-06 MED ORDER — ACETAMINOPHEN 500 MG PO TABS
1000.0000 mg | ORAL_TABLET | Freq: Four times a day (QID) | ORAL | Status: DC
  Administered 2020-01-06 – 2020-01-07 (×6): 1000 mg via ORAL
  Filled 2020-01-06 (×7): qty 2

## 2020-01-06 MED ORDER — SODIUM CHLORIDE 0.9 % IV SOLN
2.0000 g | Freq: Two times a day (BID) | INTRAVENOUS | Status: AC
  Administered 2020-01-06 – 2020-01-07 (×2): 2 g via INTRAVENOUS
  Filled 2020-01-06 (×2): qty 2

## 2020-01-06 MED ORDER — PREGABALIN 50 MG PO CAPS
100.0000 mg | ORAL_CAPSULE | Freq: Three times a day (TID) | ORAL | Status: DC
  Administered 2020-01-06 – 2020-01-07 (×5): 100 mg via ORAL
  Filled 2020-01-06 (×6): qty 2

## 2020-01-06 MED ORDER — BUPIVACAINE-EPINEPHRINE (PF) 0.25% -1:200000 IJ SOLN
INTRAMUSCULAR | Status: AC
  Filled 2020-01-06: qty 30

## 2020-01-06 SURGICAL SUPPLY — 111 items
ADH SKN CLS APL DERMABOND .7 (GAUZE/BANDAGES/DRESSINGS) ×2
ANCHOR TIS RET SYS 1550ML (BAG) ×1 IMPLANT
APL PRP STRL LF DISP 70% ISPRP (MISCELLANEOUS) ×2
BAG SPEC RTRVL C1550 25.4 (BAG) ×2
CANISTER SUCT 1200ML W/VALVE (MISCELLANEOUS) ×3 IMPLANT
CANNULA REDUC XI 12-8 STAPL (CANNULA) ×3
CANNULA REDUCER 12-8 DVNC XI (CANNULA) ×2 IMPLANT
CATH ROBINSON RED A/P 16FR (CATHETERS) ×3 IMPLANT
CHLORAPREP W/TINT 26 (MISCELLANEOUS) ×3 IMPLANT
CLIP VESOLOCK LG 6/CT PURPLE (CLIP) ×3 IMPLANT
CLIP VESOLOCK MED LG 6/CT (CLIP) ×3 IMPLANT
CNTNR SPEC 2.5X3XGRAD LEK (MISCELLANEOUS) ×2
CONT SPEC 4OZ STER OR WHT (MISCELLANEOUS) ×1
CONT SPEC 4OZ STRL OR WHT (MISCELLANEOUS) ×2
CONTAINER SPEC 2.5X3XGRAD LEK (MISCELLANEOUS) IMPLANT
COVER MAYO STAND REUSABLE (DRAPES) ×3 IMPLANT
COVER WAND RF STERILE (DRAPES) ×3 IMPLANT
DECANTER SPIKE VIAL GLASS SM (MISCELLANEOUS) ×3 IMPLANT
DEFOGGER SCOPE WARMER CLEARIFY (MISCELLANEOUS) ×3 IMPLANT
DERMABOND ADVANCED (GAUZE/BANDAGES/DRESSINGS) ×1
DERMABOND ADVANCED .7 DNX12 (GAUZE/BANDAGES/DRESSINGS) ×2 IMPLANT
DRAPE 3/4 80X56 (DRAPES) ×3 IMPLANT
DRAPE ARM DVNC X/XI (DISPOSABLE) ×8 IMPLANT
DRAPE COLUMN DVNC XI (DISPOSABLE) ×2 IMPLANT
DRAPE DA VINCI XI ARM (DISPOSABLE) ×12
DRAPE DA VINCI XI COLUMN (DISPOSABLE) ×3
DRAPE UNDER BUTTOCK W/FLU (DRAPES) ×3 IMPLANT
ELECT BLADE 6.5 EXT (BLADE) ×2 IMPLANT
ELECT CAUTERY BLADE 6.4 (BLADE) ×3 IMPLANT
ELECT REM PT RETURN 9FT ADLT (ELECTROSURGICAL) ×3
ELECTRODE REM PT RTRN 9FT ADLT (ELECTROSURGICAL) ×2 IMPLANT
GLOVE BIO SURGEON STRL SZ7 (GLOVE) ×9 IMPLANT
GOWN STRL REUS W/ TWL LRG LVL3 (GOWN DISPOSABLE) ×10 IMPLANT
GOWN STRL REUS W/TWL LRG LVL3 (GOWN DISPOSABLE) ×15
GRASPER LAPSCPC 5X45 DSP (INSTRUMENTS) ×3 IMPLANT
HANDLE YANKAUER SUCT BULB TIP (MISCELLANEOUS) ×3 IMPLANT
IRRIGATION STRYKERFLOW (MISCELLANEOUS) IMPLANT
IRRIGATOR STRYKERFLOW (MISCELLANEOUS) ×3
IV NS 1000ML (IV SOLUTION) ×3
IV NS 1000ML BAXH (IV SOLUTION) IMPLANT
KIT IMAGING PINPOINTPAQ (MISCELLANEOUS) ×3 IMPLANT
KIT PINK PAD W/HEAD ARE REST (MISCELLANEOUS) ×3
KIT PINK PAD W/HEAD ARM REST (MISCELLANEOUS) ×2 IMPLANT
LABEL OR SOLS (LABEL) ×3 IMPLANT
LEGGING LITHOTOMY PAIR STRL (DRAPES) ×3 IMPLANT
NEEDLE HYPO 22GX1.5 SAFETY (NEEDLE) ×3 IMPLANT
NS IRRIG 500ML POUR BTL (IV SOLUTION) ×3 IMPLANT
OBTURATOR OPTICAL STANDARD 8MM (TROCAR)
OBTURATOR OPTICAL STND 8 DVNC (TROCAR)
OBTURATOR OPTICALSTD 8 DVNC (TROCAR) IMPLANT
PACK COLON CLEAN CLOSURE (MISCELLANEOUS) ×2 IMPLANT
PACK LAP CHOLECYSTECTOMY (MISCELLANEOUS) ×3 IMPLANT
PAD PREP 24X41 OB/GYN DISP (PERSONAL CARE ITEMS) ×3 IMPLANT
PENCIL ELECTRO HAND CTR (MISCELLANEOUS) ×3 IMPLANT
PORT ACCESS TROCAR AIRSEAL 5 (TROCAR) ×1 IMPLANT
RELOAD STAPLE 45 3.5 BLU DVNC (STAPLE) IMPLANT
RELOAD STAPLE 60 2.5 WHT DVNC (STAPLE) IMPLANT
RELOAD STAPLE 60 3.5 BLU DVNC (STAPLE) IMPLANT
RELOAD STAPLER 2.5X60 WHT DVNC (STAPLE) IMPLANT
RELOAD STAPLER 3.5X45 BLU DVNC (STAPLE) IMPLANT
RELOAD STAPLER 3.5X60 BLU DVNC (STAPLE) ×4 IMPLANT
SEAL CANN UNIV 5-8 DVNC XI (MISCELLANEOUS) ×6 IMPLANT
SEAL XI 5MM-8MM UNIVERSAL (MISCELLANEOUS) ×9
SEALER VESSEL DA VINCI XI (MISCELLANEOUS) ×3
SEALER VESSEL EXT DVNC XI (MISCELLANEOUS) IMPLANT
SET TRI-LUMEN FLTR TB AIRSEAL (TUBING) ×3 IMPLANT
SOL PREP PVP 2OZ (MISCELLANEOUS) ×3
SOLUTION ELECTROLUBE (MISCELLANEOUS) ×3 IMPLANT
SOLUTION PREP PVP 2OZ (MISCELLANEOUS) ×2 IMPLANT
SPONGE LAP 18X18 RF (DISPOSABLE) ×6 IMPLANT
SPONGE LAP 4X18 RFD (DISPOSABLE) ×3 IMPLANT
STAPLER 45 DA VINCI SURE FORM (STAPLE)
STAPLER 45 SUREFORM DVNC (STAPLE) IMPLANT
STAPLER 60 DA VINCI SURE FORM (STAPLE) ×3
STAPLER 60 SUREFORM DVNC (STAPLE) IMPLANT
STAPLER CANNULA SEAL DVNC XI (STAPLE) ×2 IMPLANT
STAPLER CANNULA SEAL XI (STAPLE) ×3
STAPLER CIRC 25MM 4.8MM THK (STAPLE) ×1 IMPLANT
STAPLER CIRCULAR 29MM (STAPLE) IMPLANT
STAPLER ENDO ILS CVD 18 33 (STAPLE) IMPLANT
STAPLER PROX 25M (MISCELLANEOUS) IMPLANT
STAPLER RELOAD 2.5X60 WHITE (STAPLE)
STAPLER RELOAD 2.5X60 WHT DVNC (STAPLE)
STAPLER RELOAD 3.5X45 BLU DVNC (STAPLE)
STAPLER RELOAD 3.5X45 BLUE (STAPLE)
STAPLER RELOAD 3.5X60 BLU DVNC (STAPLE) ×4
STAPLER RELOAD 3.5X60 BLUE (STAPLE) ×6
SURGILUBE 2OZ TUBE FLIPTOP (MISCELLANEOUS) ×3 IMPLANT
SUT DVC VLOC 3-0 CL 6 P-12 (SUTURE) ×2 IMPLANT
SUT MNCRL 4-0 (SUTURE) ×6
SUT MNCRL 4-0 27XMFL (SUTURE) ×4
SUT MNCRL AB 4-0 PS2 18 (SUTURE) ×4 IMPLANT
SUT PDS AB 0 CT1 27 (SUTURE) ×4 IMPLANT
SUT SILK 2 0 (SUTURE) ×3
SUT SILK 2 0 SH (SUTURE) ×3 IMPLANT
SUT SILK 2 0SH CR/8 30 (SUTURE) ×1 IMPLANT
SUT SILK 2-0 30XBRD TIE 12 (SUTURE) ×2 IMPLANT
SUT VICRYL 0 AB UR-6 (SUTURE) ×2 IMPLANT
SUT VLOC 90 S/L VL9 GS22 (SUTURE) ×4 IMPLANT
SUTURE MNCRL 4-0 27XMF (SUTURE) IMPLANT
SYR 20ML LL LF (SYRINGE) ×6 IMPLANT
SYR 30ML LL (SYRINGE) ×2 IMPLANT
SYR TOOMEY IRRIG 70ML (MISCELLANEOUS) ×3
SYRINGE TOOMEY IRRIG 70ML (MISCELLANEOUS) ×2 IMPLANT
SYS TROCAR 1.5-3 SLV ABD GEL (ENDOMECHANICALS) ×3
SYSTEM TROCR 1.5-3 SLV ABD GEL (ENDOMECHANICALS) ×2 IMPLANT
TAPE TRANSPORE STRL 2 31045 (GAUZE/BANDAGES/DRESSINGS) ×3 IMPLANT
TRAY FOLEY MTR SLVR 16FR STAT (SET/KITS/TRAYS/PACK) ×3 IMPLANT
TRAY FOLEY SLVR 16FR LF STAT (SET/KITS/TRAYS/PACK) ×2 IMPLANT
TROCAR BALLN GELPORT 12X130M (ENDOMECHANICALS) ×2 IMPLANT
TUBING CONNECTING 10 (TUBING) ×1 IMPLANT

## 2020-01-06 NOTE — H&P (Signed)
Chief Complaint  Patient presents with  . Follow-up    Diverticulitis    HPI: Albert Tran is a 54 year old male well-known to me with recurrent episode of diverticulitis.  More recently he has experienced right-sided abdominal pain.  He has had some frustrations because of his pain.  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  He reports that his pain is intermittent sharp and moderate intensity.  No specific alleviating or aggravating factors.  Last time we talked about potential elective sigmoid colectomy.  He continues to have these intermittent episodes. Have again personally reviewed the CT scan showing evidence of mild diverticulitis on the sigmoid colon. At his request because of privacy concerns he did not want any other members of our team within the exam room.       Past Medical History:  Diagnosis Date  . Carpal tunnel syndrome   . DDD (degenerative disc disease)   . Fatty liver   . Gallstones   . Headache(784.0)    likely migraines  . Hypertension   . Hypertriglyceridemia   . Nosebleed    Hx of  . Plantar fasciitis   . Vertigo, intermittent          Past Surgical History:  Procedure Laterality Date  . HERNIA REPAIR     as a teen  . LAMINECTOMY     LS disc sx  . TONSILLECTOMY           Family History  Problem Relation Age of Onset  . Heart disease Mother        CAD  . Alcohol abuse Father   . Diabetes Father   . Heart disease Father        CAD  . Nephrolithiasis Father   . Obesity Brother   . Heart attack Brother 89  . Cancer Paternal Uncle        bladder CA  . Heart disease Sister 26       CAD with 4 stents    Social History:  reports that he quit smoking about 32 years ago. He has quit using smokeless tobacco.  His smokeless tobacco use included chew. He reports that he does not drink alcohol or use drugs.  Allergies: No Known Allergies  Medications  reviewed.    ROS Full ROS performed and is otherwise negative other than what is stated in HPI Physical Exam Vitals and nursing note reviewed. Exam conducted with a chaperone present.  Constitutional:      General: He is not in acute distress.    Appearance: Normal appearance. He is normal weight.  Eyes:     General: No scleral icterus.       Right eye: No discharge.        Left eye: No discharge.  Cardiovascular:     Rate and Rhythm: Normal rate and regular rhythm.     Heart sounds: No murmur.  Pulmonary:     Effort: Pulmonary effort is normal. No respiratory distress.     Breath sounds: Normal breath sounds.  Abdominal:     General: Abdomen is flat. There is no distension.     Palpations: Abdomen is soft. There is no mass.     Tenderness: There is no abdominal tenderness. There is no left CVA tenderness, guarding or rebound.     Hernia: No hernia is present.  Musculoskeletal:        General: No swelling. Normal range of motion.     Cervical  back: Normal range of motion and neck supple. No rigidity.  Skin:    General: Skin is warm and dry.     Capillary Refill: Capillary refill takes less than 2 seconds.  Neurological:     General: No focal deficit present.     Mental Status: He is alert and oriented to person, place, and time.  Psychiatric:        Mood and Affect: Mood normal.        Behavior: Behavior normal.        Thought Content: Thought content normal.        Judgment: Judgment normal.     Assessment/Plan: 54 year old male with recurrent noncomplicated diverticulitis.  Had an extensive discussion with the patient regarding the literature.  Literature suggest that sigmoid colectomy improves quality of life and decrease his episodes.  I have shared my thought process about this and he thinks that he wishes to move forward.  Before we perform an elective sigmoid colectomy I would like to interrogate his colon and currently he is hesitant about having a  colonoscopy alternatively I have offered him a barium enema.  I have also discussed with him about the importance of the CT scan to make sure the does not have a drainable collection. Aftr that we can talk about doing elective sigmoid colectomy.  He is not septic and does not need any emergent intervention at this time   Sterling Big, MD [facility] Surgeon

## 2020-01-06 NOTE — Progress Notes (Signed)
Pt very upset upon arrival d/t anesthesia is out of network for his insurance and he cannot afford to pay for his anesthesia.

## 2020-01-06 NOTE — TOC Initial Note (Addendum)
Transition of Care [facility]) - Initial/Assessment Note    Patient Details  Name: Albert Tran MRN: [id number] Date of Birth: August 27, 1966  [facility] CM/SW Contact:    De Soto Cellar, RN Phone Number: 01/06/2020, 9:23 AM  Clinical Narrative:                 Sherron Monday to Clydie Braun, surgical nurse regarding patients concerns in preop with cost of OON anesthesia and financial responsibility post op. TOC RN CM requested consult be placed as patient is currently in surgery and will be admitted post op. 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         Patient Goals and CMS Choice        Expected Discharge Plan and Services                                                Prior Living Arrangements/Services                       Activities of Daily Living      Permission Sought/Granted                  Emotional Assessment              Admission diagnosis:  Diverticulitis Patient Active Problem List   Diagnosis Date Noted  . Night sweats 06/07/2018  . Dizziness 06/07/2018  . Dyslipidemia 09/13/2016  . Family history of early CAD 09/12/2016  . Disorder of patella 11/15/2015  . Stress reaction 09/14/2015  . Essential hypertension 08/09/2015  . Obesity 08/09/2015  . Snoring 03/22/2011  . FATTY LIVER DISEASE 06/29/2010  . CHOLELITHIASIS 06/14/2010  . Hypertriglyceridemia 06/09/2010  . HYPERBILIRUBINEMIA 06/09/2010  . Headache 06/09/2010  . TRANSAMINASES, SERUM, ELEVATED 06/09/2010  . Open fracture of ankle 06/09/2010  . Plantar fasciitis, left 04/05/2010  . CARPAL TUNNEL SYNDROME 10/11/2007  . VERTIGO 09/10/2007   PCP:  Judy Pimple, MD Pharmacy:   [facility] [phone] - [address] [place] - [address] AT [facility] [address] [place] [address] Phone: [phone] Fax: [phone]  [facility] -  [place], [place] - [address] [facility] [address] [place] [address] [place] [address] Phone: [phone] Fax: [phone]     Social Determinants of Health (SDOH) Interventions    Readmission Risk Interventions No flowsheet data found.

## 2020-01-06 NOTE — Anesthesia Preprocedure Evaluation (Addendum)
Anesthesia Evaluation  Patient identified by MRN, date of birth, ID band Patient awake    Reviewed: Allergy & Precautions, H&P , NPO status , Patient's Chart, lab work & pertinent test results, reviewed documented beta blocker date and time   History of Anesthesia Complications (+) PROLONGED EMERGENCE and history of anesthetic complications  Airway Mallampati: III  TM Distance: >3 FB Neck ROM: full    Dental  (+) Teeth Intact, Dental Advidsory Given   Pulmonary neg pulmonary ROS, former smoker,    Pulmonary exam normal        Cardiovascular Exercise Tolerance: Good hypertension, (-) angina(-) Past MI and (-) Cardiac Stents Normal cardiovascular exam(-) dysrhythmias (-) Valvular Problems/Murmurs Rate:Normal     Neuro/Psych  Headaches, neg Seizures PSYCHIATRIC DISORDERS Anxiety  Neuromuscular disease (carpal tunnel)    GI/Hepatic negative GI ROS, (+) neg Cirrhosis      , NAFLD   Endo/Other  negative endocrine ROS  Renal/GU negative Renal ROS  negative genitourinary   Musculoskeletal   Abdominal   Peds  Hematology negative hematology ROS (+)   Anesthesia Other Findings Past Medical History: No date: Carpal tunnel syndrome No date: DDD (degenerative disc disease) No date: Family history of adverse reaction to anesthesia     Comment:  Nephew had malignant hypothermia. No date: Fatty liver No date: Gallstones No date: Headache(784.0)     Comment:  likely migraines No date: Hypertension No date: Hypertriglyceridemia No date: Nosebleed     Comment:  Hx of No date: Plantar fasciitis No date: Vertigo, intermittent   Reproductive/Obstetrics negative OB ROS                            Anesthesia Physical Anesthesia Plan  ASA: III  Anesthesia Plan: General   Post-op Pain Management:    Induction: Intravenous  PONV Risk Score and Plan: 3 and Ondansetron, Dexamethasone, Midazolam and  Promethazine  Airway Management Planned: Oral ETT  Additional Equipment:   Intra-op Plan:   Post-operative Plan: Extubation in OR  Informed Consent: I have reviewed the patients History and Physical, chart, labs and discussed the procedure including the risks, benefits and alternatives for the proposed anesthesia with the patient or authorized representative who has indicated his/her understanding and acceptance.       Plan Discussed with: CRNA  Anesthesia Plan Comments:        Anesthesia Quick Evaluation

## 2020-01-06 NOTE — Op Note (Addendum)
PROCEDURES: 1. Robotic assisted Laparoscopic Low anterior resection 2. Robotic Laparoscopic takedown of splenic flexure   Pre-operative Diagnosis: Recurrent diverticulitis  Post-operative Diagnosis: Same  Surgeon: Pattie Flaharty F Brytnee Bechler   Assistants: Mr. Olean Ree [facility]. ( required for the EEA anastomosis and for exposure)  Anesthesia: General endotracheal anesthesia  ASA Class: 2   Surgeon: Caroleen Hamman , MD FACS  Anesthesia: Gen. with endotracheal tube   Findings: Adhesions from the sigmoid to the pelvic wall Tension free anastomosis with very good perfusion and negative intraoperative leak Left indirect Inguinal hernia  Estimated Blood Loss: 20cc                Specimens: colon       Complications: none         Condition: stable  Procedure Details  The patient was seen again in the Holding Room. The benefits, complications, treatment options, and expected outcomes were discussed with the patient. The risks of bleeding, infection, recurrence of symptoms, failure to resolve symptoms,  bowel injury, any of which could require further surgery were reviewed with the patient.   The patient was taken to Operating Room, identified as RODRIQUES BADIE and the procedure verified.  A Time Out was held and the above information confirmed.  Prior to the induction of general anesthesia, antibiotic prophylaxis was administered. VTE prophylaxis was in place. General endotracheal anesthesia was then administered and tolerated well. After the induction, the abdomen was prepped with Chloraprep and draped in the sterile fashion. The patient was positioned in lithotomy position. 3.5 cm incision was created Left lower quadrant. The abdominal cavity was entered under direct visualization and the Mini GelPort device was placed and pneumoperitoneum was obtained, no hemodynamic changes were apparent. . Three 8 mm robotic ports were placed under direct visualization and an additional 5 mm assist port was  placed. Patient was positioned in steep trendelenburg and left side up. Robot was brought to the field and docked in the standard fashion. WE maintained visualization of our instruments at all times and avoided any collision between arms. I scrubbed out and went to the console. There were dense adhesions from sigmoid to the abdominal wall that where lysed in the standard fashion with the scissors.   We identified the takeoff of the inferior mesenteric artery dissected the pedicle and divided using vessel sealer  in the standard fashion.   Using the sealer we were able to divide the mesorectum and and also divided the mesentery of the descending colon we mobilized the descending colon IN a medial to lateral fashion. We preserved the ureter at all times. Pelvic dissection was performed in a standard fashion, being in the areolar space anterior to the sacrum. THe mesorectum was divided with the vessel sealer. We dissected the rectum circumferentially and We divided at the mid rectum with standard 60 mm blue load  The white line of pot was identified and divided and  We were also able to mobilize the splenic flexure using scisors in the standard fashion. The mesentery of the descending colon was divided and we divided the mid descending colon with a blue load 45mm stapler. We used ICG green to make sure the distal descneding colon had good perfusion. We made sure we had adequate reach so the anastomosis could be performed tension free. The specimen was removed in a bag. The robot was undocked and  I scrubbed back in. Under direct visualization the descending colon stump was opened and measured the diameter of the bowel A  25 mm dilator was perfect size. A pursestring was used after inserting the anvil device. Mr. Manus Rudd [facility] was able to pass a 25 mm standard EEA stapler device through the anus and Under direct visualization we performed an end to end anastomosis with the EEA device. A leak test was  performed inflating the colon with a Toomey syringe and a rubber catheter. No evidence of leak was observed. There was also adequate hemostasis.   All the laparoscopic ports were removed and a second look showed no evidence of any bleeding or any other injuries.  Liposomal marcaine was infiltrated at all incision sites in a full thickness fashion.  We changed gloves and  close theabdomen with two -0 PDS sutures in a running fashion to close the anterior and posterior fascia respectively. The skin incisions were closed with 4-0 Monocryl. Dermabond was used to coat all the skin incisions. Needle and laparotomy count were correct and there were no immediate complications.  Sterling Big, MD, FACS

## 2020-01-06 NOTE — Anesthesia Procedure Notes (Signed)
Procedure Name: Intubation Date/Time: 01/06/2020 8:28 AM Performed by: Almeta Monas, CRNA Pre-anesthesia Checklist: Patient identified, Patient being monitored, Timeout performed, Emergency Drugs available and Suction available Patient Re-evaluated:Patient Re-evaluated prior to induction Oxygen Delivery Method: Circle system utilized Preoxygenation: Pre-oxygenation with 100% oxygen Induction Type: IV induction Ventilation: Mask ventilation without difficulty Laryngoscope Size: 3 and McGraph Grade View: Grade I Tube type: Oral Tube size: 7.0 mm Number of attempts: 1 Airway Equipment and Method: Stylet and Video-laryngoscopy Placement Confirmation: ETT inserted through vocal cords under direct vision,  positive ETCO2 and breath sounds checked- equal and bilateral Secured at: 21 cm Tube secured with: Tape Dental Injury: Teeth and Oropharynx as per pre-operative assessment

## 2020-01-06 NOTE — Transfer of Care (Signed)
Immediate Anesthesia Transfer of Care Note  Patient: Albert Tran  Procedure(s) Performed: XI ROBOT ASSISTED LAPAROSCOPIC Sigmoid COLECTOMY (N/A ) INDOCYANINE GREEN FLUORESCENCE IMAGING (ICG)  Patient Location: PACU  Anesthesia Type:General  Level of Consciousness: sedated  Airway & Oxygen Therapy: Patient Spontanous Breathing and Patient connected to face mask oxygen  Post-op Assessment: Report given to RN and Post -op Vital signs reviewed and stable  Post vital signs: Reviewed and stable  Last Vitals:  Vitals Value Taken Time  BP 97/59 01/06/20 1302  Temp 36.1 C 01/06/20 1302  Pulse 66 01/06/20 1310  Resp 13 01/06/20 1310  SpO2 99 % 01/06/20 1310  Vitals shown include unvalidated device data.  Last Pain:  Vitals:   01/06/20 1302  PainSc: Asleep      Patients Stated Pain Goal: 0 (01/06/20 0704)  Complications: No apparent anesthesia complications

## 2020-01-07 LAB — BASIC METABOLIC PANEL
Anion gap: 8 (ref 5–15)
BUN: 15 mg/dL (ref 6–20)
CO2: 24 mmol/L (ref 22–32)
Calcium: 8.5 mg/dL — ABNORMAL LOW (ref 8.9–10.3)
Chloride: 106 mmol/L (ref 98–111)
Creatinine, Ser: 1.02 mg/dL (ref 0.61–1.24)
GFR calc Af Amer: >60 mL/min (ref >60)
GFR calc non Af Amer: >60 mL/min (ref >60)
Glucose, Bld: 120 mg/dL — ABNORMAL HIGH (ref 70–99)
Potassium: 3.8 mmol/L (ref 3.5–5.1)
Sodium: 138 mmol/L (ref 135–145)

## 2020-01-07 LAB — CBC
HCT: 34.9 % — ABNORMAL LOW (ref 39.0–52.0)
Hemoglobin: 12.3 g/dL — ABNORMAL LOW (ref 13.0–17.0)
MCH: 30.7 pg (ref 26.0–34.0)
MCHC: 35.2 g/dL (ref 30.0–36.0)
MCV: 87 fL (ref 80.0–100.0)
Platelets: 229 10*3/uL (ref 150–400)
RBC: 4.01 MIL/uL — ABNORMAL LOW (ref 4.22–5.81)
RDW: 12.7 % (ref 11.5–15.5)
WBC: 14.3 10*3/uL — ABNORMAL HIGH (ref 4.0–10.5)
nRBC: 0 % (ref 0.0–0.2)

## 2020-01-07 LAB — PHOSPHORUS: Phosphorus: 4.2 mg/dL (ref 2.5–4.6)

## 2020-01-07 LAB — MAGNESIUM: Magnesium: 2.2 mg/dL (ref 1.7–2.4)

## 2020-01-07 MED ORDER — SODIUM CHLORIDE 0.9 % IV SOLN: INTRAVENOUS | Status: DC | PRN

## 2020-01-07 NOTE — Progress Notes (Signed)
[facility] SURGICAL PROGRESS NOTE  Hospital Day(s): 1.   Post op day(s): 1 Day Post-Op.   Interval History:  Patient seen and examined no acute events or new complaints overnight.  Patient reports he is doing well, mild abdominal soreness No fever, chills, nausea, or emesis Mild leukocytosis to 14.3K; afebrile Renal function normal, sCr - 1.02, UO good - 2.2L NPO, has been passing flatus Mobilizing Biggest complaint is foley  Vital signs in last 24 hours: [min-max] current  Temp:  [97 F (36.1 C)-98.5 F (36.9 C)] 98.4 F (36.9 C) (05/05 0520) Pulse Rate:  [54-84] 54 (05/05 0520) Resp:  [13-17] 16 (05/05 0520) BP: (97-166)/(59-99) 111/73 (05/05 0520) SpO2:  [95 %-99 %] 97 % (05/05 0520)     Height: 5\' 5"  (165.1 cm) Weight: 87.1 kg BMI (Calculated): 31.95   Intake/Output last 2 shifts:  05/04 0701 - 05/05 0700 In: 3735.1 [P.O.:510; I.V.:2525.1; IV Piggyback:700] Out: 2270 [Urine:2250; Blood:20]   Physical Exam:  Constitutional: alert, cooperative and no distress  Respiratory: breathing non-labored at rest  Cardiovascular: regular rate and sinus rhythm  Gastrointestinal: soft, non-tender, and non-distended Genitourinary: foley in palce Integumentary: Laparoscopic incisions are CDI with dermabond, No erythema or drainage  Labs:  CBC Latest Ref Rng & Units 01/07/2020 01/06/2020 01/02/2020  WBC 4.0 - 10.5 K/uL 14.3(H) 13.7(H) 7.7  Hemoglobin 13.0 - 17.0 g/dL 12.3(L) 13.9 14.0  Hematocrit 39.0 - 52.0 % 34.9(L) 39.6 41.0  Platelets 150 - 400 K/uL 229 238 276   CMP Latest Ref Rng & Units 01/07/2020 01/06/2020 01/02/2020  Glucose 70 - 99 mg/dL 01/04/2020) - 84  BUN 6 - 20 mg/dL 15 - 16  Creatinine 161(W - 1.24 mg/dL 9.60 4.54) 0.98(J  Sodium 135 - 145 mmol/L 138 - 140  Potassium 3.5 - 5.1 mmol/L 3.8 - 3.8  Chloride 98 - 111 mmol/L 106 - 103  CO2 22 - 32 mmol/L 24 - 27  Calcium 8.9 - 10.3 mg/dL 1.91) - 9.4  Total Protein 6.5 - 8.1 g/dL - - -  Total Bilirubin 0.3 -  1.2 mg/dL - - -  Alkaline Phos 38 - 126 U/L - - -  AST 15 - 41 U/L - - -  ALT 0 - 44 U/L - - -     Imaging studies: No new pertinent imaging studies   Assessment/Plan:  54 y.o. male doing very well with return of bowel function 1 Day Post-Op s/p laparoscopic LAR and sigmoid colectomy for recurrent diverticulitis.   - Initiate CLD; advance to FLD if doing well tonight    - Wean IVF  - pain control prn; antiemetics prn  - monitor abdominal examination; on-going bowel function  - Discontinue foley catheter    - Monitor leukocytosis; likely reactive   - Mobilization encouraged  - Medical management of comorbid conditions  - DVT prophylaxis   All of the above findings and recommendations were discussed with the patient, and the medical team, and all of patient's questions were answered to his expressed satisfaction.  -- 45, PA-C [facility] 01/07/2020, 7:28 AM [phone] M-F: 7am - 4pm

## 2020-01-07 NOTE — Anesthesia Postprocedure Evaluation (Signed)
Anesthesia Post Note  Patient: SHED NIXON  Procedure(s) Performed: XI ROBOT ASSISTED LAPAROSCOPIC Sigmoid COLECTOMY (N/A ) INDOCYANINE GREEN FLUORESCENCE IMAGING (ICG)  Patient location during evaluation: PACU Anesthesia Type: General Level of consciousness: awake and alert Pain management: pain level controlled Vital Signs Assessment: post-procedure vital signs reviewed and stable Respiratory status: spontaneous breathing, nonlabored ventilation, respiratory function stable and patient connected to nasal cannula oxygen Cardiovascular status: blood pressure returned to baseline and stable Postop Assessment: no apparent nausea or vomiting Anesthetic complications: no     Last Vitals:  Vitals:   01/07/20 0012 01/07/20 0520  BP: 113/76 111/73  Pulse: 64 (!) 54  Resp: 16 16  Temp: 36.9 C 36.9 C  SpO2: 96% 97%    Last Pain:  Vitals:   01/07/20 0520  TempSrc: Oral  PainSc:                  Yevette Edwards

## 2020-01-08 LAB — CBC
HCT: 36.6 % — ABNORMAL LOW (ref 39.0–52.0)
Hemoglobin: 12.5 g/dL — ABNORMAL LOW (ref 13.0–17.0)
MCH: 30.1 pg (ref 26.0–34.0)
MCHC: 34.2 g/dL (ref 30.0–36.0)
MCV: 88.2 fL (ref 80.0–100.0)
Platelets: 238 10*3/uL (ref 150–400)
RBC: 4.15 MIL/uL — ABNORMAL LOW (ref 4.22–5.81)
RDW: 13.1 % (ref 11.5–15.5)
WBC: 12.3 10*3/uL — ABNORMAL HIGH (ref 4.0–10.5)
nRBC: 0 % (ref 0.0–0.2)

## 2020-01-08 LAB — SURGICAL PATHOLOGY

## 2020-01-08 MED ORDER — IBUPROFEN 800 MG PO TABS
800.0000 mg | ORAL_TABLET | Freq: Three times a day (TID) | ORAL | 0 refills | Status: DC | PRN
Start: 2020-01-08 — End: 2020-01-28

## 2020-01-08 MED ORDER — LOPERAMIDE HCL 2 MG PO TABS
2.0000 mg | ORAL_TABLET | Freq: Three times a day (TID) | ORAL | 0 refills | Status: DC | PRN
Start: 2020-01-08 — End: 2020-01-28

## 2020-01-08 MED ORDER — OXYCODONE HCL 5 MG PO TABS: 5.0000 mg | ORAL_TABLET | ORAL | 0 refills | Status: DC | PRN

## 2020-01-08 NOTE — Progress Notes (Signed)
Albert Tran  A and O x 4 VSS. Pt tolerating diet well. No complaints of pain or nausea. IV removed intact, prescriptions given. Pt voices understanding of discharge instructions with no further questions. Pt discharged via wheelchair with axillary.   Allergies as of 01/08/2020   No Known Allergies     Medication List    STOP taking these medications   bisacodyl 5 MG EC tablet Commonly known as: DULCOLAX   erythromycin base 500 MG tablet Commonly known as: E-MYCIN   neomycin 500 MG tablet Commonly known as: MYCIFRADIN   polyethylene glycol powder 17 GM/SCOOP powder Commonly known as: MiraLax     TAKE these medications   amLODipine 5 MG tablet Commonly known as: NORVASC Take 5 mg by mouth daily as needed (bp over 150/90). Notes to patient: As needed   FISH OIL PO Take 1 capsule by mouth daily. Notes to patient: Morning 01/09/20   ibuprofen 800 MG tablet Commonly known as: ADVIL Take 1 tablet (800 mg total) by mouth every 8 (eight) hours as needed. Notes to patient: As needed   lisinopril-hydrochlorothiazide 20-25 MG tablet Commonly known as: ZESTORETIC Take 1 tablet by mouth daily. MUST SCHEDULE OFFICE OR VIRTUAL VISIT Notes to patient: Morning 01/09/20   loperamide 2 MG tablet Commonly known as: Imodium A-D Take 1 tablet (2 mg total) by mouth 3 (three) times daily as needed for diarrhea or loose stools. Notes to patient: As needed   multivitamin with minerals Tabs tablet Take 1 tablet by mouth daily. Notes to patient: Morning 01/09/20   oxyCODONE 5 MG immediate release tablet Commonly known as: Oxy IR/ROXICODONE Take 1 tablet (5 mg total) by mouth every 4 (four) hours as needed for severe pain or breakthrough pain. Notes to patient: As needed       Vitals:   01/08/20 0603 01/08/20 1149  BP: (!) 148/83 (!) 167/78  Pulse: (!) 55 (!) 56  Resp: 16 20  Temp: (!) 97.5 F (36.4 C) 98.1 F (36.7 C)  SpO2: 98% 100%    Albert Tran

## 2020-01-08 NOTE — Discharge Summary (Signed)
[facility] SURGICAL DISCHARGE SUMMARY   Patient ID: Albert Tran MRN: [id number] DOB/AGE: 10-11-1965 54 y.o.  Admit date: 01/06/2020 Discharge date: 01/08/2020  Discharge Diagnoses Patient Active Problem List   Diagnosis Date Noted  . S/P laparoscopic-assisted sigmoidectomy 01/06/2020    Consultants None  Procedures 01/06/2020: 1. Robotic assisted Laparoscopic Low anterior resection 2. Robotic Laparoscopic takedown of splenic flexure   HPI: Albert Tran is a 54 y.o. male with a history of recurrent diverticulitis who presents to [facility] on 05/04 for scheduled robotic assisted laparoscopic sigmoid colectomy with Dr [facility] Course: Informed consent was obtained and documented, and patient underwent uneventful robotic assisted laparoscopic LAR and sigmoid colectomy (Dr Everlene Farrier, 01/06/2020).  Post-operatively, patient did very well and advancement of patient's diet and ambulation were well-tolerated. The remainder of patient's hospital course was essentially unremarkable, and discharge planning was initiated accordingly with patient safely able to be discharged home with appropriate discharge instructions, pain control, and outpatient follow-up after all of his questions were answered to his expressed satisfaction.   Discharge Condition: Good   Physical Examination:  Constitutional: alert, cooperative and no distress  Respiratory: breathing non-labored at rest  Cardiovascular: regular rate and sinus rhythm  Gastrointestinal: soft, non-tender, and non-distended, no rebound/guarding Integumentary: Laparoscopic incisions are CDI with dermabond, No erythema or drainage   Allergies as of 01/08/2020   No Known Allergies     Medication List    STOP taking these medications   bisacodyl 5 MG EC tablet Commonly known as: DULCOLAX   erythromycin base 500 MG tablet Commonly known as: E-MYCIN   neomycin 500 MG tablet Commonly known as: MYCIFRADIN    polyethylene glycol powder 17 GM/SCOOP powder Commonly known as: MiraLax     TAKE these medications   amLODipine 5 MG tablet Commonly known as: NORVASC Take 5 mg by mouth daily as needed (bp over 150/90).   FISH OIL PO Take 1 capsule by mouth daily.   ibuprofen 800 MG tablet Commonly known as: ADVIL Take 1 tablet (800 mg total) by mouth every 8 (eight) hours as needed.   lisinopril-hydrochlorothiazide 20-25 MG tablet Commonly known as: ZESTORETIC Take 1 tablet by mouth daily. MUST SCHEDULE OFFICE OR VIRTUAL VISIT   loperamide 2 MG tablet Commonly known as: Imodium A-D Take 1 tablet (2 mg total) by mouth 3 (three) times daily as needed for diarrhea or loose stools.   multivitamin with minerals Tabs tablet Take 1 tablet by mouth daily.   oxyCODONE 5 MG immediate release tablet Commonly known as: Oxy IR/ROXICODONE Take 1 tablet (5 mg total) by mouth every 4 (four) hours as needed for severe pain or breakthrough pain.        Follow-up Information    Pabon, [place] F, MD. Schedule an appointment as soon as possible for a visit on 01/14/2020.   Specialty: General Surgery Why: s/p robotic sigmoid Contact information: [address] [place] [address] [phone]            Time spent on discharge management including discussion of hospital course, clinical condition, outpatient instructions, prescriptions, and follow up with the patient and members of the medical team: >30 minutes  -- Lynden Oxford , PA-C [facility]  01/08/2020, 9:10 AM [phone] M-F: 7am - 4pm

## 2020-01-08 NOTE — Discharge Instructions (Signed)

## 2020-01-14 ENCOUNTER — Ambulatory Visit (INDEPENDENT_AMBULATORY_CARE_PROVIDER_SITE_OTHER): Payer: Self-pay | Admitting: Surgery

## 2020-01-14 ENCOUNTER — Other Ambulatory Visit: Payer: Self-pay

## 2020-01-14 ENCOUNTER — Encounter: Payer: Self-pay | Admitting: Surgery

## 2020-01-14 VITALS — BP 157/96 | HR 67 | Temp 95.2°F | Ht 65.0 in | Wt 198.2 lb

## 2020-01-14 DIAGNOSIS — Z09 Encounter for follow-up examination after completed treatment for conditions other than malignant neoplasm: Secondary | ICD-10-CM

## 2020-01-14 NOTE — Progress Notes (Signed)
S/p rob sigmoid colectomy Doing very well tolerating PO, + BM No fevers Not using narcotics Path d/w pt   PE NAD Abd: soft, incisions c./d/i, reducible ventral/UH  A/P Doing very well w/o complications Fiber in diet RTC 3 months, we may need to address left IH and UH

## 2020-01-14 NOTE — Patient Instructions (Signed)
GENERAL POST-OPERATIVE PATIENT INSTRUCTIONS   FOLLOW-UP:  Please make an appointment with your physician in.  Call your physician immediately if you have any fevers greater than 102.5, drainage from you wound that is not clear or looks infected, persistent bleeding, increasing abdominal pain, problems urinating, or persistent nausea/vomiting.    WOUND CARE INSTRUCTIONS:  Keep a dry clean dressing on the wound if there is drainage. The initial bandage may be removed after 24 hours.  Once the wound has quit draining you may leave it open to air.  If clothing rubs against the wound or causes irritation and the wound is not draining you may cover it with a dry dressing during the daytime.  Try to keep the wound dry and avoid ointments on the wound unless directed to do so.  If the wound becomes bright red and painful or starts to drain infected material that is not clear, please contact your physician immediately.  If the wound is mildly pink and has a thick firm ridge underneath it, this is normal, and is referred to as a healing ridge.  This will resolve over the next 4-6 weeks.  DIET:  You may eat any foods that you can tolerate.  It is a good idea to eat a high fiber diet and take in plenty of fluids to prevent constipation.  If you do become constipated you may want to take a mild laxative or take ducolax tablets on a daily basis until your bowel habits are regular.  Constipation can be very uncomfortable, along with straining, after recent surgery.  ACTIVITY:  You are encouraged to cough and deep breath or use your incentive spirometer if you were given one, every 15-30 minutes when awake.  This will help prevent respiratory complications and low grade fevers post-operatively if you had a general anesthetic.  You may want to hug a pillow when coughing and sneezing to add additional support to the surgical area, if you had abdominal or chest surgery, which will decrease pain during these times.  You are  encouraged to walk and engage in light activity for the next two weeks.  You should not lift more than 20 pounds during this time frame as it could put you at increased risk for complications.  Twenty pounds is roughly equivalent to a plastic bag of groceries.    MEDICATIONS:  Try to take narcotic medications and anti-inflammatory medications, such as tylenol, ibuprofen, naprosyn, etc., with food.  This will minimize stomach upset from the medication.  Should you develop nausea and vomiting from the pain medication, or develop a rash, please discontinue the medication and contact your physician.  You should not drive, make important decisions, or operate machinery when taking narcotic pain medication.  QUESTIONS:  Please feel free to call your physician or the hospital operator if you have any questions, and they will be glad to assist you.   

## 2020-01-27 ENCOUNTER — Telehealth: Payer: Self-pay | Admitting: *Deleted

## 2020-01-27 NOTE — Telephone Encounter (Signed)
Per Dr.Pabon patient will need to contact primary care provider to discuss the swelling he is experiencing in his hand, as it is highly unlikely that the swelling is from patient's surgery on 01/06/20. Patient verbalized understanding and has no further questions.

## 2020-01-27 NOTE — Telephone Encounter (Signed)
Patient called and stated that he had surgery on 01/06/20 by Dr Everlene Farrier and he noticed that about 4 days ago his left hand starting swelling up, its not itching. He doesn't think its a bug bite and is concern that it may be caused from the surgery

## 2020-01-28 ENCOUNTER — Encounter: Payer: Self-pay | Admitting: Family Medicine

## 2020-01-28 ENCOUNTER — Ambulatory Visit: Payer: Commercial Managed Care - PPO | Admitting: Family Medicine

## 2020-01-28 ENCOUNTER — Other Ambulatory Visit: Payer: Self-pay

## 2020-01-28 VITALS — BP 134/82 | HR 67 | Temp 97.8°F | Ht 65.0 in | Wt 200.0 lb

## 2020-01-28 DIAGNOSIS — I10 Essential (primary) hypertension: Secondary | ICD-10-CM | POA: Diagnosis not present

## 2020-01-28 DIAGNOSIS — M7989 Other specified soft tissue disorders: Secondary | ICD-10-CM | POA: Diagnosis not present

## 2020-01-28 MED ORDER — CEPHALEXIN 500 MG PO CAPS
500.0000 mg | ORAL_CAPSULE | Freq: Two times a day (BID) | ORAL | 0 refills | Status: DC
Start: 2020-01-28 — End: 2020-04-05

## 2020-01-28 MED ORDER — LISINOPRIL-HYDROCHLOROTHIAZIDE 20-25 MG PO TABS: 1.0000 | ORAL_TABLET | Freq: Every day | ORAL | 3 refills | Status: DC

## 2020-01-28 NOTE — Progress Notes (Signed)
Subjective:    Patient ID: Albert Tran, male    DOB: 21-May-1966, 54 y.o.   MRN: [id number]  This visit occurred during the SARS-CoV-2 public health emergency.  Safety protocols were in place, including screening questions prior to the visit, additional usage of staff PPE, and extensive cleaning of exam room while observing appropriate contact time as indicated for disinfecting solutions.    HPI Pt presents with hand swelling for 5 days  Right handed   Had surgery with an IV last week (back of hand)  Had gone down to normal and then popped back up  Had a little scab ? 2 dots    ? If insect bite No itching or burning  No pain   No trauma   Had a partial colectomy for diverticulosis  Making a good recovery  Did not need pain med after he got home    Patient Active Problem List   Diagnosis Date Noted  . Swelling of left hand 01/28/2020  . S/P laparoscopic-assisted sigmoidectomy 01/06/2020  . Night sweats 06/07/2018  . Dizziness 06/07/2018  . Dyslipidemia 09/13/2016  . Family history of early CAD 09/12/2016  . Disorder of patella 11/15/2015  . Stress reaction 09/14/2015  . Essential hypertension 08/09/2015  . Obesity 08/09/2015  . Snoring 03/22/2011  . FATTY LIVER DISEASE 06/29/2010  . CHOLELITHIASIS 06/14/2010  . Hypertriglyceridemia 06/09/2010  . HYPERBILIRUBINEMIA 06/09/2010  . Headache 06/09/2010  . TRANSAMINASES, SERUM, ELEVATED 06/09/2010  . Open fracture of ankle 06/09/2010  . Plantar fasciitis, left 04/05/2010  . CARPAL TUNNEL SYNDROME 10/11/2007  . VERTIGO 09/10/2007   Past Medical History:  Diagnosis Date  . Carpal tunnel syndrome   . DDD (degenerative disc disease)   . Family history of adverse reaction to anesthesia    Nephew had malignant hypothermia.  . Fatty liver   . Gallstones   . Headache(784.0)    likely migraines  . Hypertension   . Hypertriglyceridemia   . Nosebleed    Hx of  . Plantar fasciitis   . Vertigo, intermittent     Past Surgical History:  Procedure Laterality Date  . HERNIA REPAIR     as a teen  . KNEE ARTHROSCOPY    . LAMINECTOMY     LS disc sx  . TONSILLECTOMY     Social History   Tobacco Use  . Smoking status: Former Smoker    Quit date: 09/05/1987    Years since quitting: 32.4  . Smokeless tobacco: Former Neurosurgeon    Types: Chew  Substance Use Topics  . Alcohol use: No    Alcohol/week: 0.0 standard drinks  . Drug use: No   Family History  Problem Relation Age of Onset  . Heart disease Mother        CAD  . Alcohol abuse Father   . Diabetes Father   . Heart disease Father        CAD  . Nephrolithiasis Father   . Obesity Brother   . Heart attack Brother 48  . Cancer Paternal Uncle        bladder CA  . Heart disease Sister 27       CAD with 4 stents   No Known Allergies Current Outpatient Medications on File Prior to Visit  Medication Sig Dispense Refill  . amLODipine (NORVASC) 5 MG tablet Take 5 mg by mouth daily as needed (bp over 150/90).    . Multiple Vitamin (MULTIVITAMIN WITH MINERALS) TABS tablet Take 1 tablet by mouth  daily.    . Omega-3 Fatty Acids (FISH OIL PO) Take 1 capsule by mouth daily.     No current facility-administered medications on file prior to visit.     Review of Systems  Constitutional: Negative for activity change, appetite change, fatigue, fever and unexpected weight change.  HENT: Negative for congestion, rhinorrhea, sore throat and trouble swallowing.   Eyes: Negative for pain, redness, itching and visual disturbance.  Respiratory: Negative for cough, chest tightness, shortness of breath and wheezing.   Cardiovascular: Negative for chest pain and palpitations.  Gastrointestinal: Negative for abdominal pain, blood in stool, constipation, diarrhea and nausea.       Incisional soreness from recent surgery is improving  Endocrine: Negative for cold intolerance, heat intolerance, polydipsia and polyuria.  Genitourinary: Negative for difficulty  urinating, dysuria, frequency and urgency.  Musculoskeletal: Negative for arthralgias, joint swelling and myalgias.       Left hand swollen (back of hand)   Skin: Negative for pallor and rash.  Neurological: Negative for dizziness, tremors, weakness, numbness and headaches.  Hematological: Negative for adenopathy. Does not bruise/bleed easily.  Psychiatric/Behavioral: Negative for decreased concentration and dysphoric mood. The patient is not nervous/anxious.        Objective:   Physical Exam Constitutional:      General: He is not in acute distress.    Appearance: Normal appearance. He is obese. He is not ill-appearing.  Eyes:     General: No scleral icterus.       Right eye: No discharge.        Left eye: No discharge.     Conjunctiva/sclera: Conjunctivae normal.     Pupils: Pupils are equal, round, and reactive to light.  Cardiovascular:     Rate and Rhythm: Normal rate and regular rhythm.  Pulmonary:     Effort: Pulmonary effort is normal. No respiratory distress.     Breath sounds: No wheezing.  Abdominal:     General: Abdomen is flat. There is no distension.     Comments: Well healing incisions from laparoscopic surgery  Musculoskeletal:        General: Swelling present.     Cervical back: Normal range of motion and neck supple.     Comments: Left hand- soft tissue swelling dorsal / limited to center  Very slight erythema /warmth if any  No bony tenderness Nl rom hand and wrist  Nl perfusion/sensation  Nl grip   No palpable cords in LUE and no tenderness  No axillary adenopathy  Lymphadenopathy:     Cervical: No cervical adenopathy.  Skin:    General: Skin is warm and dry.     Findings: No rash.  Neurological:     Mental Status: He is alert.     Sensory: No sensory deficit.     Motor: No weakness.     Deep Tendon Reflexes: Reflexes normal.  Psychiatric:        Mood and Affect: Mood normal.           Assessment & Plan:   Problem List Items Addressed  This Visit      Cardiovascular and Mediastinum   Essential hypertension    bp in fair control at this time  BP Readings from Last 1 Encounters:  01/28/20 134/82   No changes needed Most recent labs reviewed  Disc lifstyle change with low sodium diet and exercise        Relevant Medications   lisinopril-hydrochlorothiazide (ZESTORETIC) 20-25 MG tablet  Other   Swelling of left hand - Primary    Localized soft tissue swelling on the back of his hand in area where he recently had IV for surgery  This went down and then came back  Not much discomfort and no h/o trauma  Some erythema Will tx with keflex to cover possible infection and watch carefully  Update if not starting to improve in a week or if worsening

## 2020-01-28 NOTE — Assessment & Plan Note (Signed)
bp in fair control at this time  BP Readings from Last 1 Encounters:  01/28/20 134/82   No changes needed Most recent labs reviewed  Disc lifstyle change with low sodium diet and exercise

## 2020-01-28 NOTE — Assessment & Plan Note (Signed)
Localized soft tissue swelling on the back of his hand in area where he recently had IV for surgery  This went down and then came back  Not much discomfort and no h/o trauma  Some erythema Will tx with keflex to cover possible infection and watch carefully  Update if not starting to improve in a week or if worsening

## 2020-01-28 NOTE — Patient Instructions (Signed)
Elevate hand when you can  Warm and cool compresses can help for 10 minutes at a time Protect hand from trauma  Take the keflex as directed   If symptoms worsen- swelling/redness or if pain develops please let me know

## 2020-03-12 ENCOUNTER — Encounter: Payer: Self-pay | Admitting: Surgery

## 2020-04-03 IMAGING — CR ABDOMEN - 2 VIEW
1 series · 3 of 3 positions shown · non-contrast
Comparison: None.

CLINICAL DATA: Abdominal pain and bloating.

EXAM:
ABDOMEN - 2 VIEW

[Series 1: w abdomen upright · 0.14mm/px · 3 of 3 slices shown]
[im 1/3]
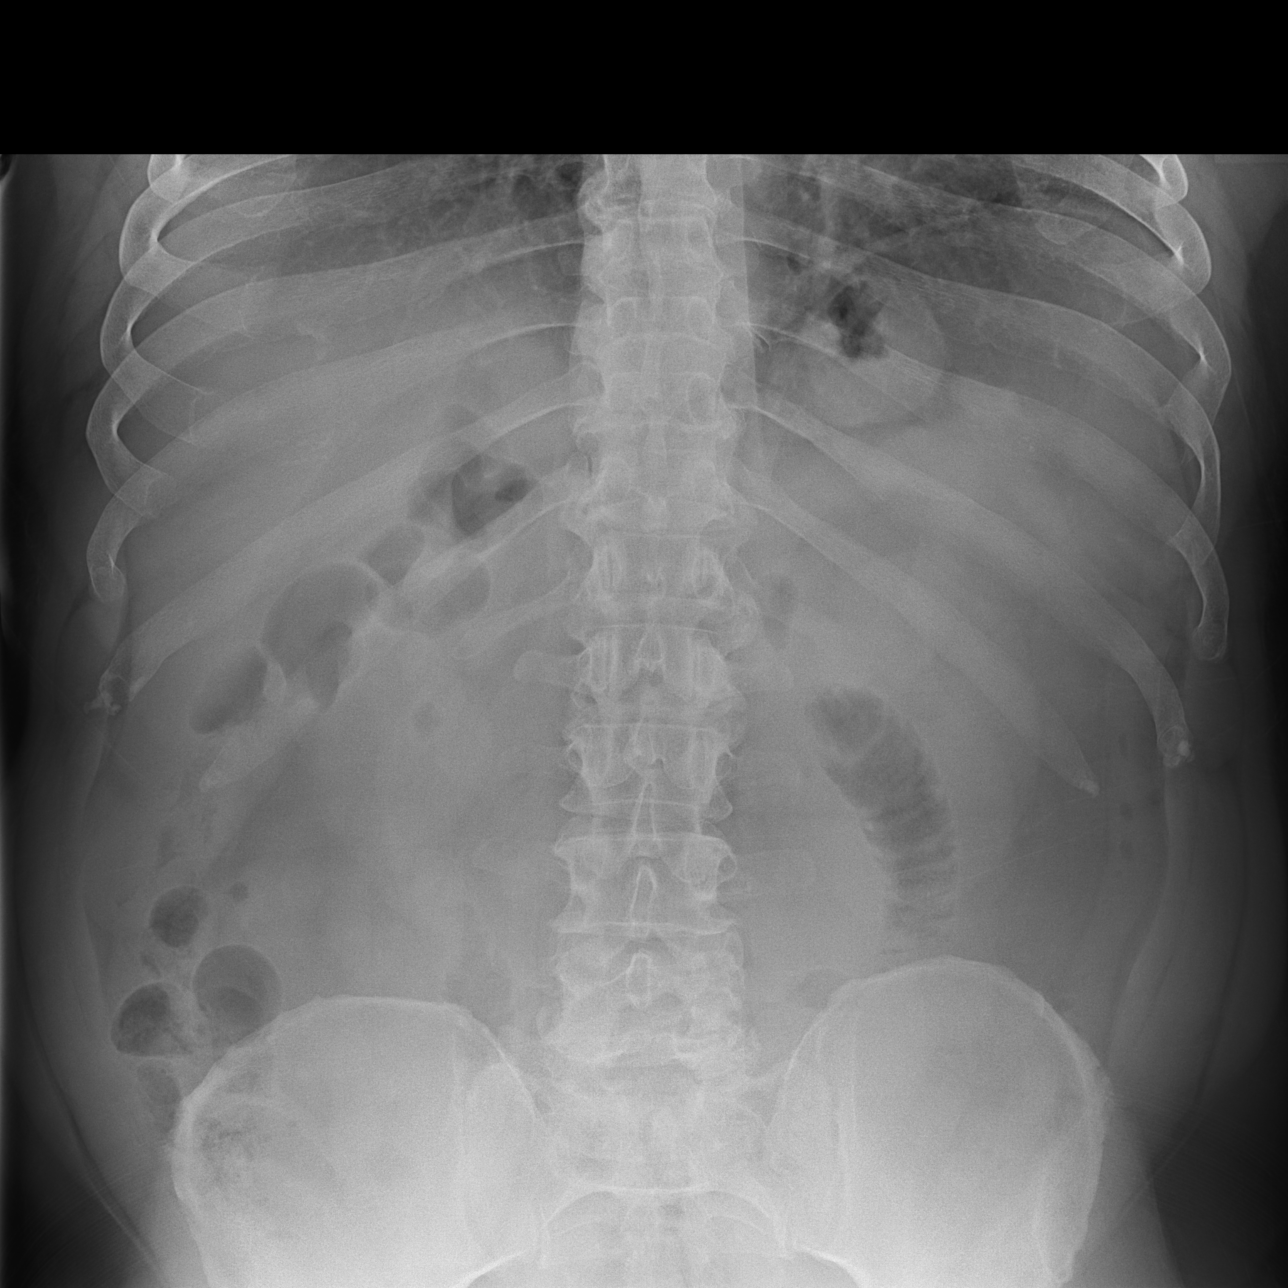
[im 2/3]
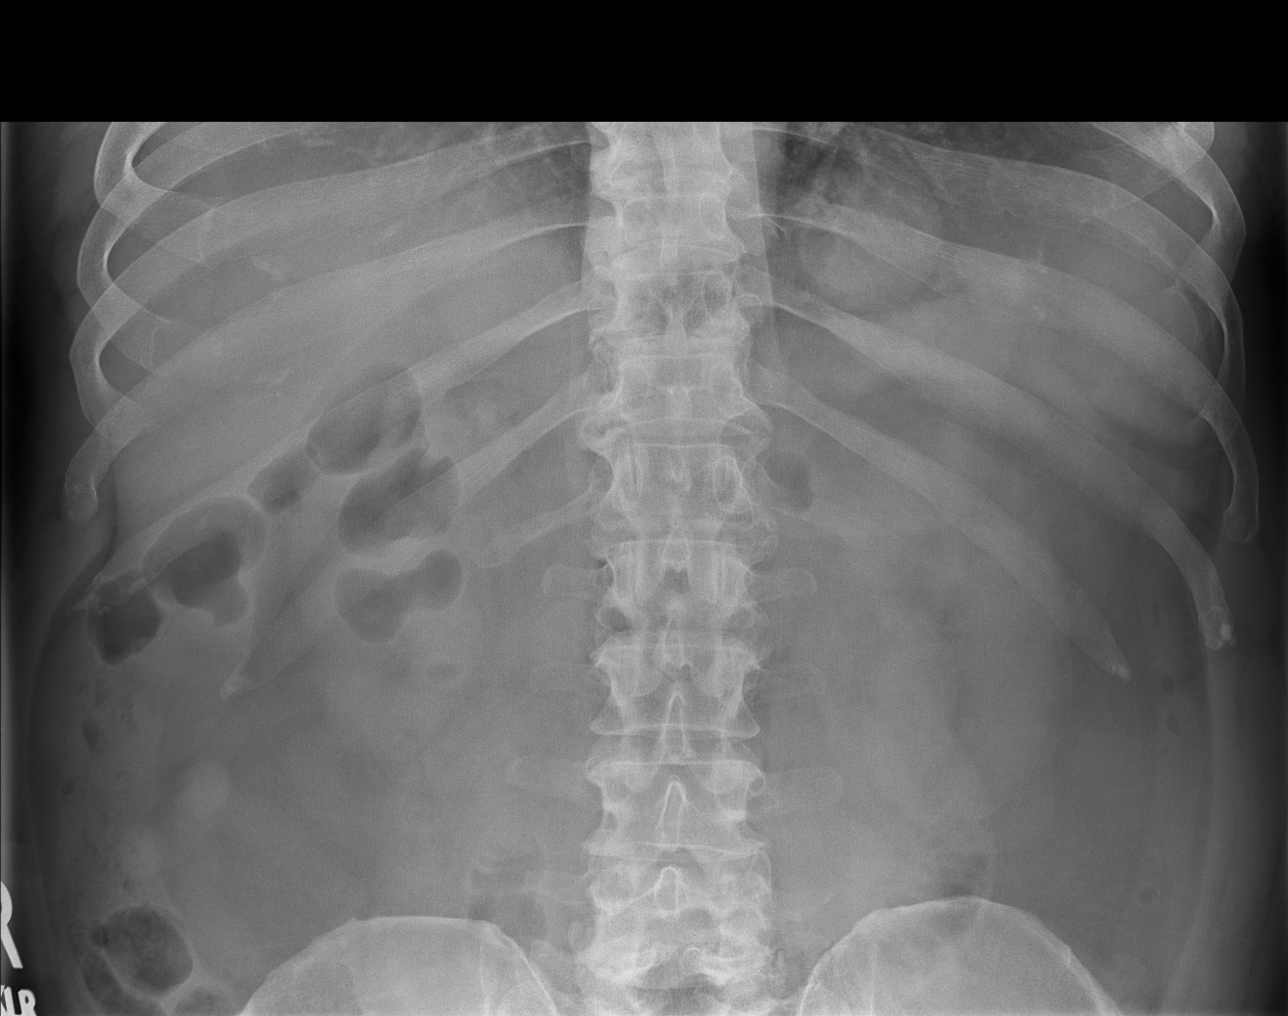
[im 3/3]
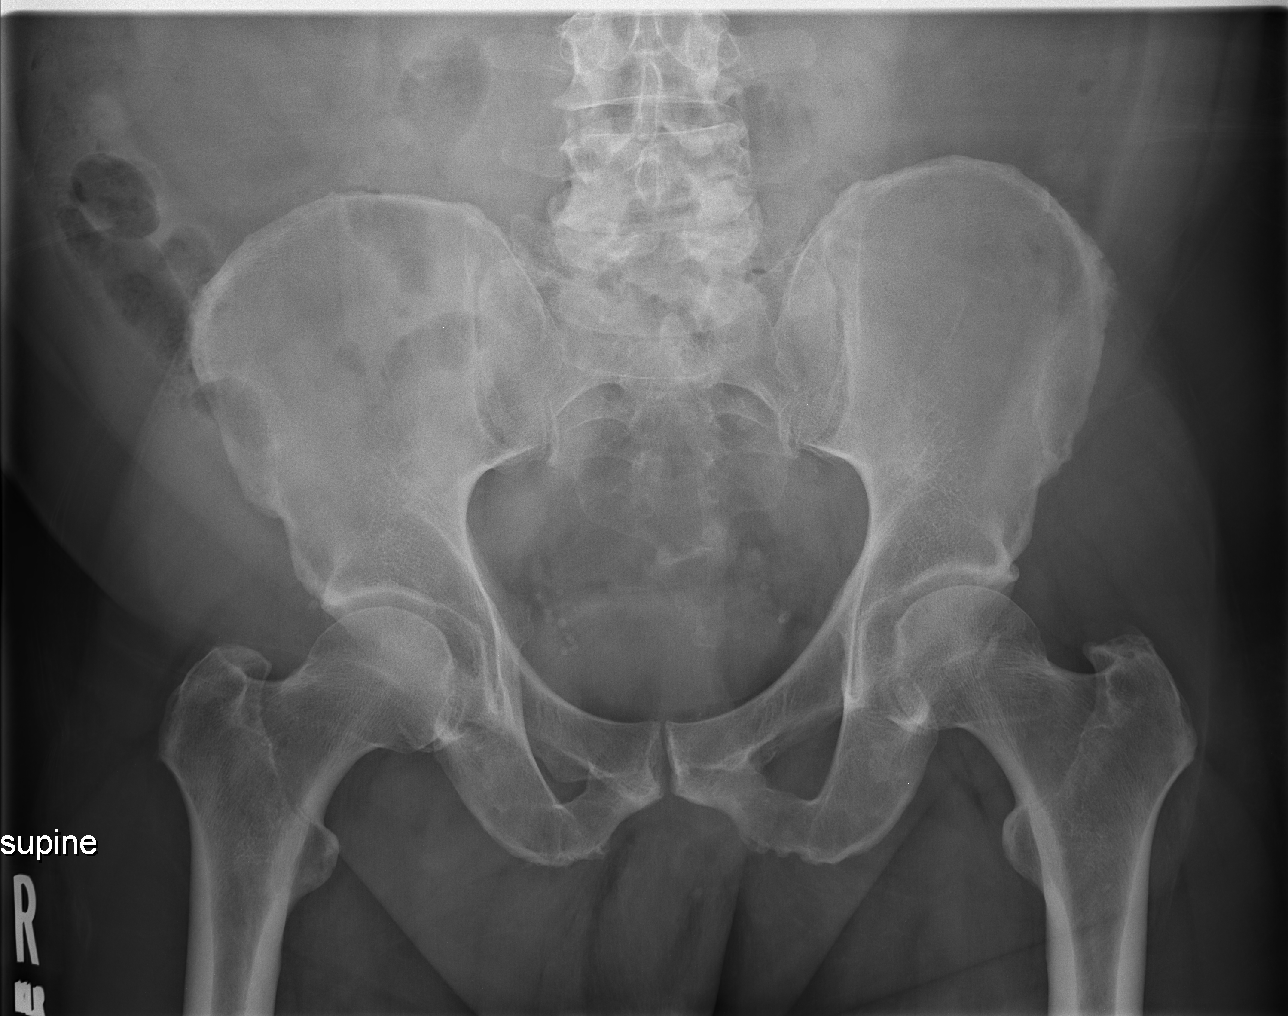

[3 of 3 positions shown; findings below may reference images not displayed]

FINDINGS: The bowel gas pattern is normal. There is no evidence of free air.
No radio-opaque calculi or other significant radiographic
abnormality is seen.
IMPRESSION: No evidence of bowel obstruction or ileus.

## 2020-04-05 ENCOUNTER — Ambulatory Visit (INDEPENDENT_AMBULATORY_CARE_PROVIDER_SITE_OTHER): Payer: Commercial Managed Care - PPO | Admitting: Surgery

## 2020-04-05 ENCOUNTER — Encounter: Payer: Self-pay | Admitting: Surgery

## 2020-04-05 ENCOUNTER — Other Ambulatory Visit: Payer: Self-pay

## 2020-04-05 VITALS — BP 168/91 | HR 53 | Temp 97.7°F | Ht 65.0 in | Wt 205.0 lb

## 2020-04-05 DIAGNOSIS — Z09 Encounter for follow-up examination after completed treatment for conditions other than malignant neoplasm: Secondary | ICD-10-CM

## 2020-04-05 NOTE — Progress Notes (Signed)
Outpatient Surgical Follow Up  04/05/2020  Albert Tran is an 54 y.o. male.   Chief Complaint  Patient presents with  . Routine Post Op    colectomy 01/2020    HPI: Albert Tran is a 54 year old male well-known to me with a prior history of diverticulitis status post robotic sigmoid colectomy doing well.  He does have a known left inguinal hernia and umbilical hernia.  He is here for follow-up .  He still has the discomfort on his right upper quadrant.  No fevers no chills.  He states that the discomfort is like a pressure type.  His pressure is 24/7.  He also complains that he only has a very small amount of bowel movements.  He is very frustrated because of the amount of stool that he produced is not enough.  He denies any fevers any chills no hematochezia.  He also states that 2 weeks after his colectomy he was the happiest he has ever been because he was able to have 4-5 bowel movements per day.  He has tried fiber without any success.  He has states that he has tried to keep a food diary without any significant changes with food.  Past Medical History:  Diagnosis Date  . Carpal tunnel syndrome   . DDD (degenerative disc disease)   . Family history of adverse reaction to anesthesia    Nephew had malignant hypothermia.  . Fatty liver   . Gallstones   . Headache(784.0)    likely migraines  . Hypertension   . Hypertriglyceridemia   . Nosebleed    Hx of  . Plantar fasciitis   . Vertigo, intermittent     Past Surgical History:  Procedure Laterality Date  . HERNIA REPAIR     as a teen  . KNEE ARTHROSCOPY    . LAMINECTOMY     LS disc sx  . TONSILLECTOMY      Family History  Problem Relation Age of Onset  . Heart disease Mother        CAD  . Alcohol abuse Father   . Diabetes Father   . Heart disease Father        CAD  . Nephrolithiasis Father   . Obesity Brother   . Heart attack Brother 48  . Cancer Paternal Uncle        bladder CA  . Heart disease Sister 10        CAD with 4 stents    Social History:  reports that he quit smoking about 32 years ago. He has quit using smokeless tobacco.  His smokeless tobacco use included chew. He reports that he does not drink alcohol and does not use drugs.  Allergies:  Allergies  Allergen Reactions  . Pollen Extract     Medications reviewed.    ROS Full ROS performed and is otherwise negative other than what is stated in HPI   BP (!) 168/91   Pulse 53   Temp 97.7 F (36.5 C) (Oral)   Ht 5\' 5"  (1.651 m)   Wt (!) 205 lb (93 kg)   SpO2 96%   BMI 34.11 kg/m   Physical Exam Vitals and nursing note reviewed. Exam conducted with a chaperone present.  Constitutional:      General: He is not in acute distress.    Appearance: Normal appearance. He is normal weight.  Eyes:     General: No scleral icterus.       Right eye: No discharge.  Left eye: No discharge.  Cardiovascular:     Rate and Rhythm: Normal rate and regular rhythm.  Pulmonary:     Effort: Pulmonary effort is normal.     Breath sounds: Normal breath sounds.  Abdominal:     General: Abdomen is flat. There is no distension.     Palpations: Abdomen is soft. There is no mass.     Tenderness: There is no abdominal tenderness. There is no guarding.     Hernia: A hernia is present.     Comments: Reducible Uh and LIH, no peritonitis  Musculoskeletal:        General: No swelling or tenderness. Normal range of motion.     Cervical back: Normal range of motion and neck supple. No rigidity or tenderness.  Skin:    General: Skin is warm and dry.     Capillary Refill: Capillary refill takes less than 2 seconds.  Neurological:     General: No focal deficit present.     Mental Status: He is alert and oriented to person, place, and time.  Psychiatric:        Behavior: Behavior normal.        Thought Content: Thought content normal.        Judgment: Judgment normal.         Assessment/Plan:  54 year old male with multiple  functional abdominal issue.  Some right upper quadrant discomfort pressure might be related to IBS.  Symptomatology is not classic for biliary disease and is not classic for anything in particular.  The only prior explanation for his symptoms was diverticulitis and diverticulosis.  He is status post sigmoid colectomy and he still has persistent symptoms.  With that in mind I will have to lean on my GI colleagues to further assess work him up regarding his multiple GI issues.  No evidence of surgical complication.  We will see him back in about 6 weeks.   Sterling Big, MD [facility] General Surgeon

## 2020-04-05 NOTE — Patient Instructions (Signed)
Referral sent to [facility]. Dr.Vanga. Someone from their office will contact you to schedule an appointment.   Please take 17 grams of Miralax daily for the constipation. Miralax can be purchased at [facility] or any drug store.

## 2020-04-25 IMAGING — CT CT ABDOMEN AND PELVIS WITH CONTRAST
1 of 3 series · 14 of 32 positions shown, 19 images · IV contrast (APPLIED)
Comparison: 01/20/2019

CLINICAL DATA: Generalized abdominal pain, status post antibiotic
therapy

EXAM:
CT ABDOMEN AND PELVIS WITH CONTRAST
TECHNIQUE: Multidetector CT imaging of the abdomen and pelvis was performed
using the standard protocol following bolus administration of
intravenous contrast.
CONTRAST:  100mL OMNIPAQUE IOHEXOL 300 MG/ML  SOLN

[Series 2: axial st · axial · 0.80mm/px · z∈[-825,-375]mm · 14 of 103 slices shown, 19 images]
[im 7/103  soft-tissue]
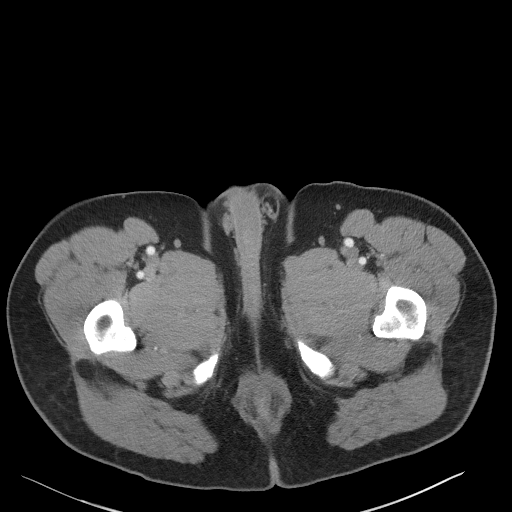
[im 7/103  bone]
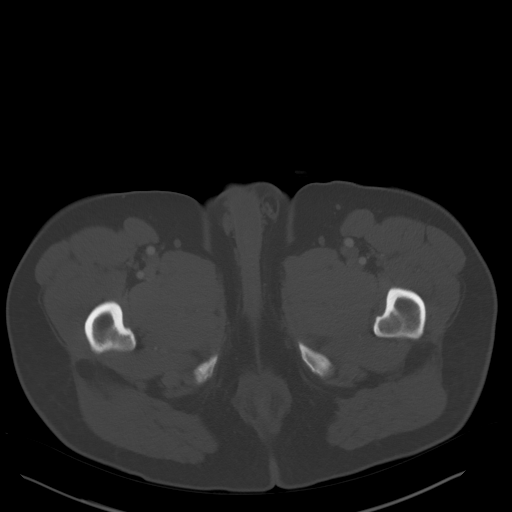
[im 13/103  soft-tissue]
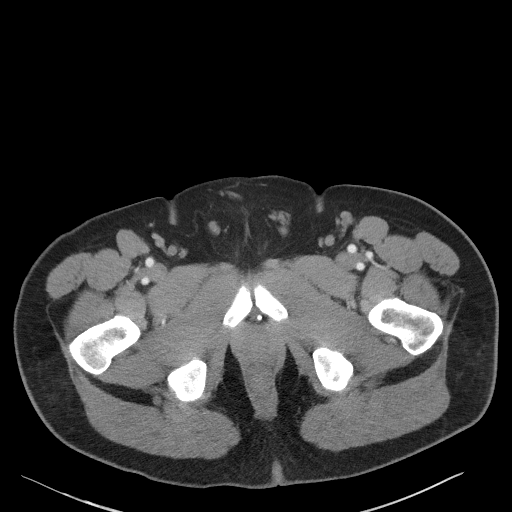
[im 25/103  soft-tissue]
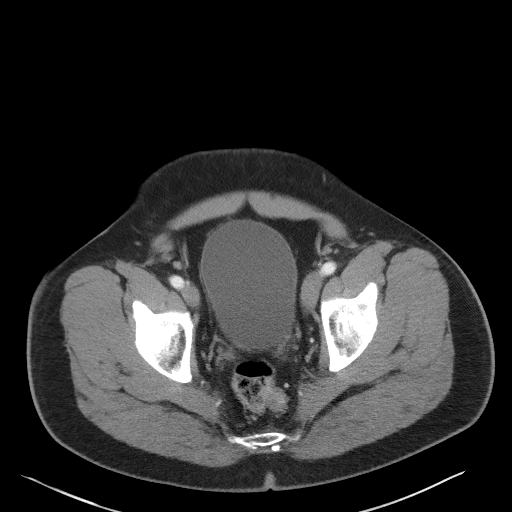
[im 31/103  soft-tissue]
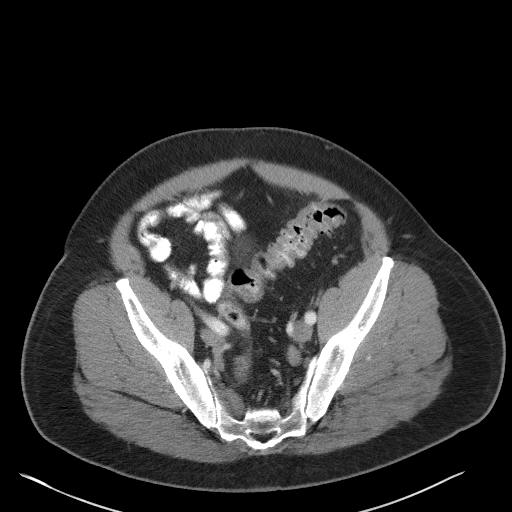
[im 37/103  soft-tissue]
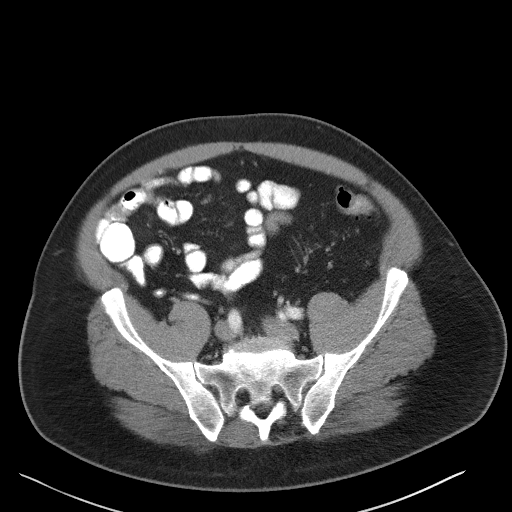
[im 43/103  soft-tissue]
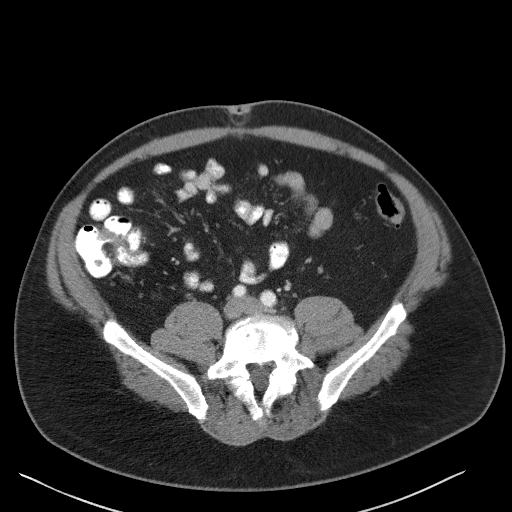
[im 55/103  soft-tissue]
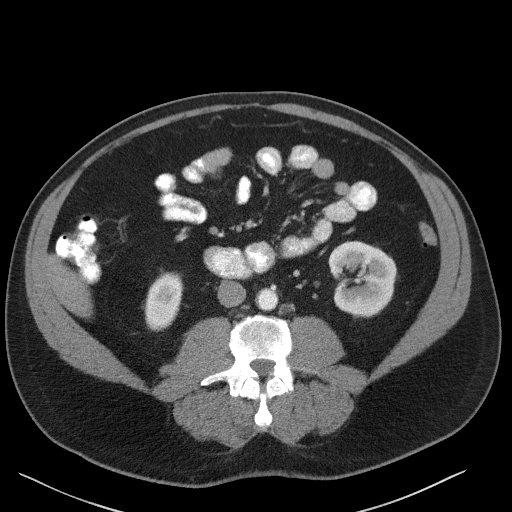
[im 61/103  soft-tissue]
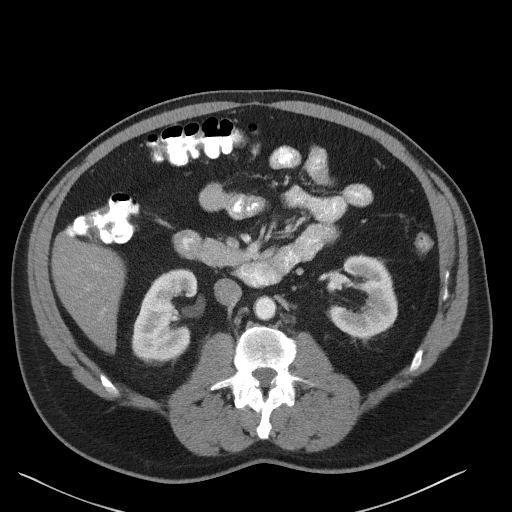
[im 67/103  soft-tissue]
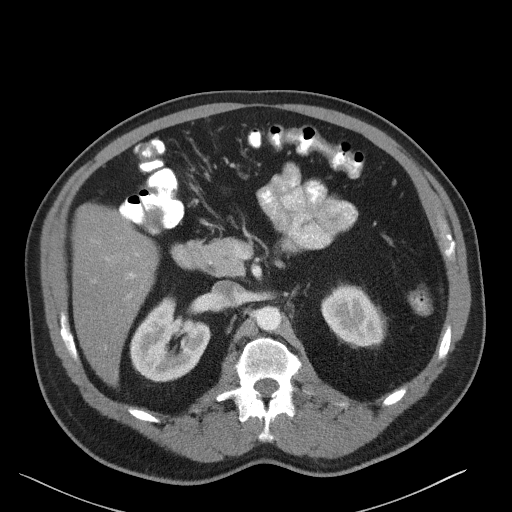
[im 67/103  bone]
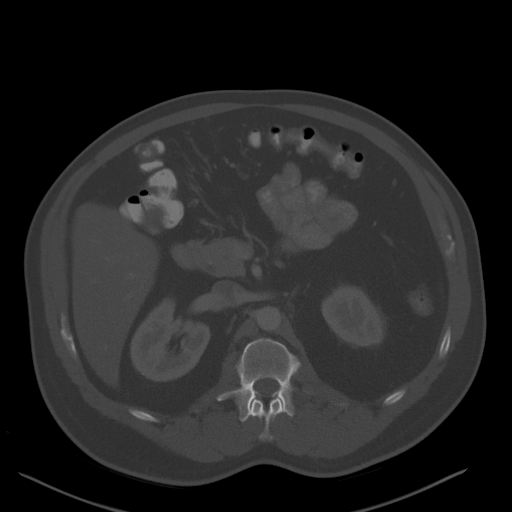
[im 73/103  soft-tissue]
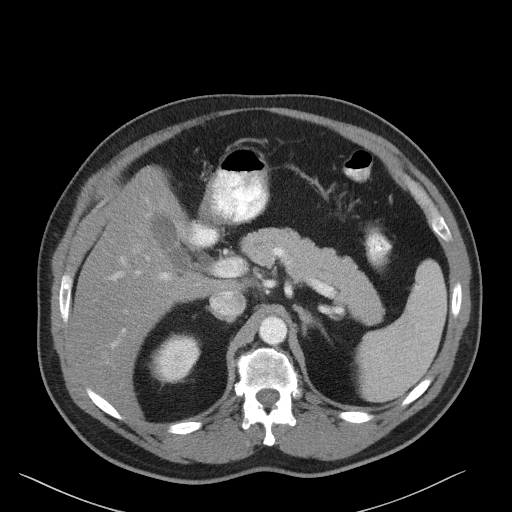
[im 79/103  soft-tissue]
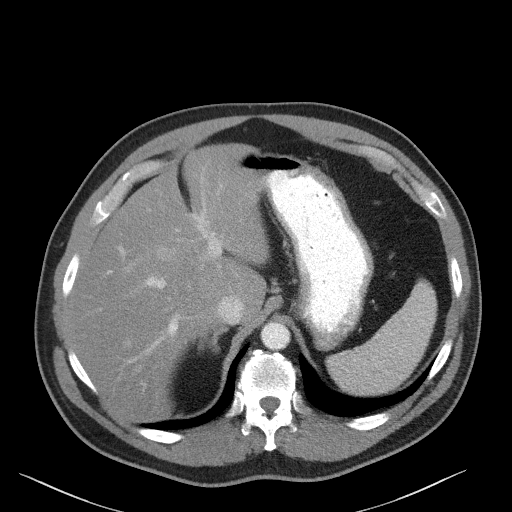
[im 79/103  lung]
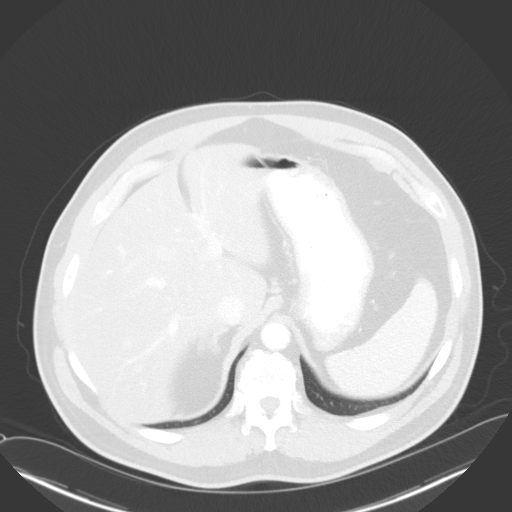
[im 85/103  lung]
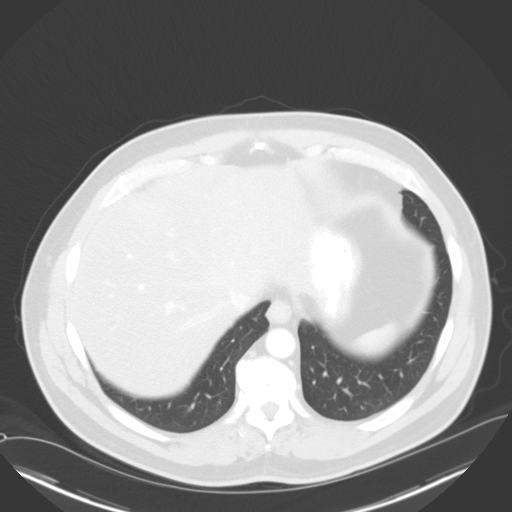
[im 91/103  soft-tissue]
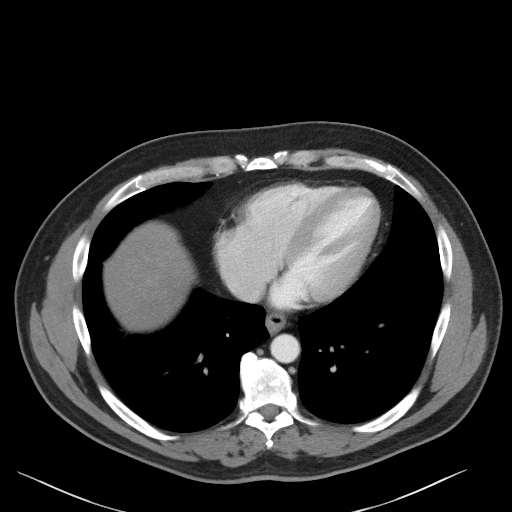
[im 91/103  lung]
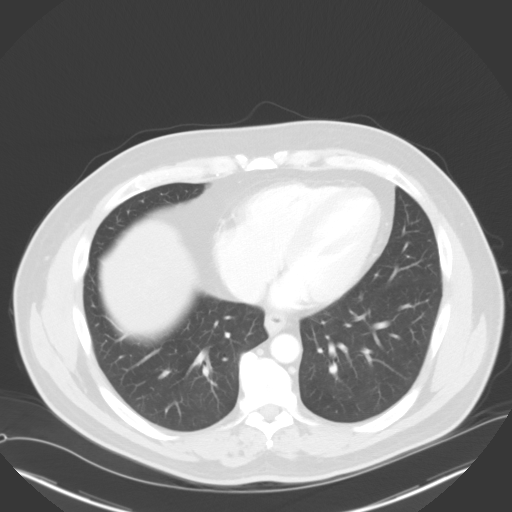
[im 97/103  soft-tissue]
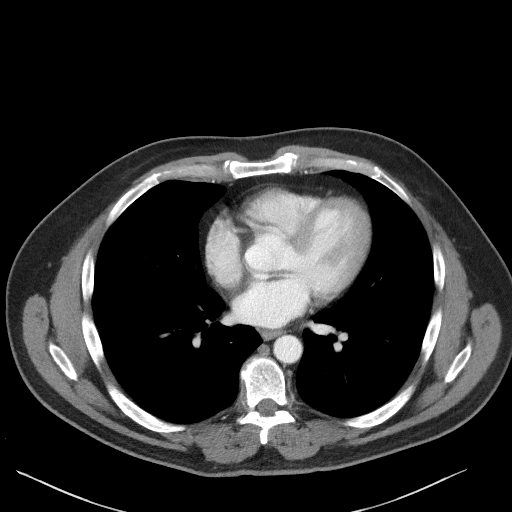
[im 97/103  lung]
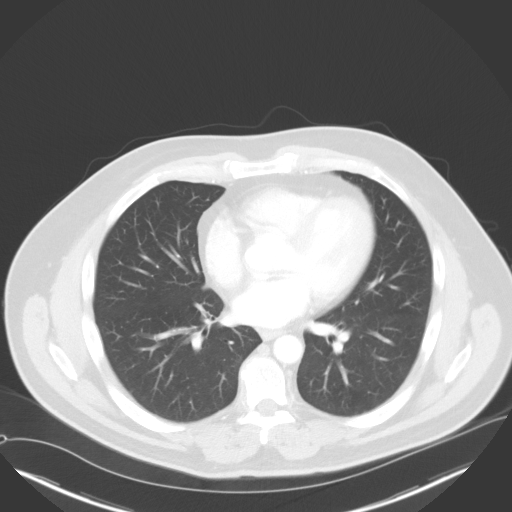

[14 of 32 positions shown; findings below may reference images not displayed]

FINDINGS: Lower chest: No acute abnormality.  Coronary artery calcifications.

Hepatobiliary: No solid liver abnormality is seen. Hepatic
steatosis. No gallstones, gallbladder wall thickening, or biliary
dilatation.

Pancreas: Unremarkable. No pancreatic ductal dilatation or
surrounding inflammatory changes.

Spleen: Normal in size without significant abnormality.

Adrenals/Urinary Tract: Adrenal glands are unremarkable. Kidneys are
normal, without renal calculi, solid lesion, or hydronephrosis.
Bladder is unremarkable.

Stomach/Bowel: Stomach is within normal limits. Appendix appears
normal. Redemonstrated diverticulosis of the descending colon and
proximal sigmoid with mild, persistent although improved
inflammatory stranding and wall thickening of the proximal sigmoid
(series 2, image 71).

Vascular/Lymphatic: Scattered aortic atherosclerosis. No enlarged
abdominal or pelvic lymph nodes.

Reproductive: No mass or other significant abnormality.

Other: No abdominal wall hernia or abnormality. No abdominopelvic
ascites.

Musculoskeletal: No acute or significant osseous findings.
Incidental note of bilateral pars defects of L5 without significant
listhesis.
IMPRESSION: 1. Redemonstrated diverticulosis of the descending colon and
proximal sigmoid with mild, persistent although improved
inflammatory stranding and wall thickening of the proximal sigmoid
(series 2, image 71). Findings are consistent with improved
diverticulitis. No evidence of perforation or abscess.

2.  Hepatic steatosis.

3.  Aortic atherosclerosis and coronary artery disease.

## 2020-07-01 ENCOUNTER — Other Ambulatory Visit: Payer: Self-pay

## 2020-07-01 ENCOUNTER — Encounter: Payer: Self-pay | Admitting: Gastroenterology

## 2020-07-01 ENCOUNTER — Ambulatory Visit: Payer: Commercial Managed Care - PPO | Admitting: Gastroenterology

## 2020-07-01 VITALS — BP 156/90 | HR 79 | Ht 65.0 in | Wt 204.4 lb

## 2020-07-01 DIAGNOSIS — R195 Other fecal abnormalities: Secondary | ICD-10-CM

## 2020-07-01 DIAGNOSIS — R109 Unspecified abdominal pain: Secondary | ICD-10-CM

## 2020-07-01 NOTE — H&P (View-Only) (Signed)
Primary Care Physician: Tower, Audrie Gallus, MD  Primary Gastroenterologist:  Dr. Midge Tran  Chief Complaint  Patient presents with  . Follow up diverticulitis    HPI: Albert Tran is a 54 y.o. male here for follow-up after having a sigmoidectomy for recurrent diverticulitis.  The patient had this surgery back in May and followed up with surgery in August.  Despite having the surgery to take out his area affected by diverticulitis he still continues to have right-sided abdominal pain that he states is worse when he moves around in bed and when he lays on that side. He also reports that his stools have become pencil thin. He states that the symptoms were present before having his sigmoid colon removed and is somewhat frustrated. He is also frustrated due to the fact that it took 3 months to get an appointment to in and see me.  Past Medical History:  Diagnosis Date  . Carpal tunnel syndrome   . DDD (degenerative disc disease)   . Family history of adverse reaction to anesthesia    Nephew had malignant hypothermia.  . Fatty liver   . Gallstones   . Headache(784.0)    likely migraines  . Hypertension   . Hypertriglyceridemia   . Nosebleed    Hx of  . Plantar fasciitis   . Vertigo, intermittent     Current Outpatient Medications  Medication Sig Dispense Refill  . amLODipine (NORVASC) 5 MG tablet Take 5 mg by mouth daily as needed (bp over 150/90).    Marland Kitchen lisinopril-hydrochlorothiazide (ZESTORETIC) 20-25 MG tablet Take 1 tablet by mouth daily. MUST SCHEDULE OFFICE OR VIRTUAL VISIT 90 tablet 3  . Multiple Vitamin (MULTI-VITAMIN) tablet Take by mouth.    . Omega-3 Fatty Acids (FISH OIL PO) Take 1 capsule by mouth daily.     No current facility-administered medications for this visit.    Allergies as of 07/01/2020 - Review Complete 07/01/2020  Allergen Reaction Noted  . Pollen extract  10/27/2014    ROS:  General: Negative for anorexia, weight loss, fever, chills,  fatigue, weakness. ENT: Negative for hoarseness, difficulty swallowing , nasal congestion. CV: Negative for chest pain, angina, palpitations, dyspnea on exertion, peripheral edema.  Respiratory: Negative for dyspnea at rest, dyspnea on exertion, cough, sputum, wheezing.  GI: See history of present illness. GU:  Negative for dysuria, hematuria, urinary incontinence, urinary frequency, nocturnal urination.  Endo: Negative for unusual weight change.    Physical Examination:   BP (!) 156/90   Pulse 79   Ht 5\' 5"  (1.651 m)   Wt 204 lb 6.4 oz (92.7 kg)   BMI 34.01 kg/m   General: Well-nourished, well-developed in no acute distress.  Eyes: No icterus. Conjunctivae pink. Lungs: Clear to auscultation bilaterally. Non-labored. Heart: Regular rate and rhythm, no murmurs rubs or gallops.  Abdomen: Bowel sounds are normal, positive tenderness with 1 finger palpation while flexing the abdominal wall muscles, nondistended, no hepatosplenomegaly or masses, no abdominal bruits or hernia , no rebound or guarding.   Extremities: No lower extremity edema. No clubbing or deformities. Neuro: Alert and oriented x 3.  Grossly intact. Skin: Warm and dry, no jaundice.   Psych: Alert and cooperative, normal mood and affect.  Labs:    Imaging Studies: No results found.  Assessment and Plan:   Albert Tran is a 54 y.o. y/o male who comes in with what appears to be right-sided musculoskeletal pain which is reproducible while flexing the abdominal wall muscles and  palpating with 1 finger. The patient has been explained this and has been given some suggestions about anti-inflammatory medication and warm compresses to the abdominal wall. The patient will be set up for colonoscopy due to a change in his stool caliber. The patient was suggested to have a colonoscopy in the past but because of his recurrent diverticulitis the procedure could never be done. The patient has been explained the plan agrees with  it.     Albert Minium, MD. Clementeen Graham    Note: This dictation was prepared with Dragon dictation along with smaller phrase technology. Any transcriptional errors that result from this process are unintentional.

## 2020-07-01 NOTE — Progress Notes (Signed)
Primary Care Physician: Tower, Audrie Gallus, MD  Primary Gastroenterologist:  Dr. Midge Minium  Chief Complaint  Patient presents with  . Follow up diverticulitis    HPI: Albert Tran is a 54 y.o. male here for follow-up after having a sigmoidectomy for recurrent diverticulitis.  The patient had this surgery back in May and followed up with surgery in August.  Despite having the surgery to take out his area affected by diverticulitis he still continues to have right-sided abdominal pain that he states is worse when he moves around in bed and when he lays on that side. He also reports that his stools have become pencil thin. He states that the symptoms were present before having his sigmoid colon removed and is somewhat frustrated. He is also frustrated due to the fact that it took 3 months to get an appointment to in and see me.  Past Medical History:  Diagnosis Date  . Carpal tunnel syndrome   . DDD (degenerative disc disease)   . Family history of adverse reaction to anesthesia    Nephew had malignant hypothermia.  . Fatty liver   . Gallstones   . Headache(784.0)    likely migraines  . Hypertension   . Hypertriglyceridemia   . Nosebleed    Hx of  . Plantar fasciitis   . Vertigo, intermittent     Current Outpatient Medications  Medication Sig Dispense Refill  . amLODipine (NORVASC) 5 MG tablet Take 5 mg by mouth daily as needed (bp over 150/90).    Marland Kitchen lisinopril-hydrochlorothiazide (ZESTORETIC) 20-25 MG tablet Take 1 tablet by mouth daily. MUST SCHEDULE OFFICE OR VIRTUAL VISIT 90 tablet 3  . Multiple Vitamin (MULTI-VITAMIN) tablet Take by mouth.    . Omega-3 Fatty Acids (FISH OIL PO) Take 1 capsule by mouth daily.     No current facility-administered medications for this visit.    Allergies as of 07/01/2020 - Review Complete 07/01/2020  Allergen Reaction Noted  . Pollen extract  10/27/2014    ROS:  General: Negative for anorexia, weight loss, fever, chills,  fatigue, weakness. ENT: Negative for hoarseness, difficulty swallowing , nasal congestion. CV: Negative for chest pain, angina, palpitations, dyspnea on exertion, peripheral edema.  Respiratory: Negative for dyspnea at rest, dyspnea on exertion, cough, sputum, wheezing.  GI: See history of present illness. GU:  Negative for dysuria, hematuria, urinary incontinence, urinary frequency, nocturnal urination.  Endo: Negative for unusual weight change.    Physical Examination:   BP (!) 156/90   Pulse 79   Ht 5\' 5"  (1.651 m)   Wt 204 lb 6.4 oz (92.7 kg)   BMI 34.01 kg/m   General: Well-nourished, well-developed in no acute distress.  Eyes: No icterus. Conjunctivae pink. Lungs: Clear to auscultation bilaterally. Non-labored. Heart: Regular rate and rhythm, no murmurs rubs or gallops.  Abdomen: Bowel sounds are normal, positive tenderness with 1 finger palpation while flexing the abdominal wall muscles, nondistended, no hepatosplenomegaly or masses, no abdominal bruits or hernia , no rebound or guarding.   Extremities: No lower extremity edema. No clubbing or deformities. Neuro: Alert and oriented x 3.  Grossly intact. Skin: Warm and dry, no jaundice.   Psych: Alert and cooperative, normal mood and affect.  Labs:    Imaging Studies: No results found.  Assessment and Plan:   Albert Tran is a 54 y.o. y/o male who comes in with what appears to be right-sided musculoskeletal pain which is reproducible while flexing the abdominal wall muscles and  palpating with 1 finger. The patient has been explained this and has been given some suggestions about anti-inflammatory medication and warm compresses to the abdominal wall. The patient will be set up for colonoscopy due to a change in his stool caliber. The patient was suggested to have a colonoscopy in the past but because of his recurrent diverticulitis the procedure could never be done. The patient has been explained the plan agrees with  it.     Albert Chretien, MD. FACG    Note: This dictation was prepared with Dragon dictation along with smaller phrase technology. Any transcriptional errors that result from this process are unintentional.  

## 2020-07-02 ENCOUNTER — Other Ambulatory Visit: Payer: Self-pay

## 2020-07-02 DIAGNOSIS — Z1211 Encounter for screening for malignant neoplasm of colon: Secondary | ICD-10-CM

## 2020-07-09 ENCOUNTER — Other Ambulatory Visit: Payer: Self-pay

## 2020-07-09 ENCOUNTER — Other Ambulatory Visit
Admission: RE | Admit: 2020-07-09 | Discharge: 2020-07-09 | Disposition: A | Discharge status: Home / Self Care | Payer: Commercial Managed Care - PPO | Source: Ambulatory Visit | Attending: Gastroenterology | Admitting: Gastroenterology

## 2020-07-09 DIAGNOSIS — Z20822 Contact with and (suspected) exposure to covid-19: Secondary | ICD-10-CM | POA: Diagnosis not present

## 2020-07-09 DIAGNOSIS — Z01812 Encounter for preprocedural laboratory examination: Secondary | ICD-10-CM | POA: Insufficient documentation

## 2020-07-09 LAB — SARS CORONAVIRUS 2 (TAT 6-24 HRS): SARS Coronavirus 2: NEGATIVE

## 2020-07-13 ENCOUNTER — Ambulatory Visit
Admission: RE | Admit: 2020-07-13 | Discharge: 2020-07-13 | Disposition: A | Discharge status: Home / Self Care | Payer: Commercial Managed Care - PPO | Attending: Gastroenterology | Admitting: Gastroenterology

## 2020-07-13 ENCOUNTER — Ambulatory Visit: Payer: Commercial Managed Care - PPO | Admitting: Anesthesiology

## 2020-07-13 ENCOUNTER — Encounter
Admission: RE | Disposition: A | Discharge status: Home / Self Care | Payer: Self-pay | Source: Home / Self Care | Attending: Gastroenterology

## 2020-07-13 ENCOUNTER — Encounter: Payer: Self-pay | Admitting: Gastroenterology

## 2020-07-13 DIAGNOSIS — Z980 Intestinal bypass and anastomosis status: Secondary | ICD-10-CM | POA: Insufficient documentation

## 2020-07-13 DIAGNOSIS — Z833 Family history of diabetes mellitus: Secondary | ICD-10-CM | POA: Diagnosis not present

## 2020-07-13 DIAGNOSIS — Z8249 Family history of ischemic heart disease and other diseases of the circulatory system: Secondary | ICD-10-CM | POA: Diagnosis not present

## 2020-07-13 DIAGNOSIS — Z1211 Encounter for screening for malignant neoplasm of colon: Secondary | ICD-10-CM

## 2020-07-13 DIAGNOSIS — K641 Second degree hemorrhoids: Secondary | ICD-10-CM | POA: Diagnosis not present

## 2020-07-13 DIAGNOSIS — Z8349 Family history of other endocrine, nutritional and metabolic diseases: Secondary | ICD-10-CM | POA: Insufficient documentation

## 2020-07-13 DIAGNOSIS — K760 Fatty (change of) liver, not elsewhere classified: Secondary | ICD-10-CM | POA: Diagnosis not present

## 2020-07-13 DIAGNOSIS — Z87891 Personal history of nicotine dependence: Secondary | ICD-10-CM | POA: Diagnosis not present

## 2020-07-13 DIAGNOSIS — R194 Change in bowel habit: Secondary | ICD-10-CM | POA: Insufficient documentation

## 2020-07-13 DIAGNOSIS — Z9109 Other allergy status, other than to drugs and biological substances: Secondary | ICD-10-CM | POA: Diagnosis not present

## 2020-07-13 DIAGNOSIS — Z841 Family history of disorders of kidney and ureter: Secondary | ICD-10-CM | POA: Diagnosis not present

## 2020-07-13 DIAGNOSIS — E781 Pure hyperglyceridemia: Secondary | ICD-10-CM | POA: Insufficient documentation

## 2020-07-13 DIAGNOSIS — R195 Other fecal abnormalities: Secondary | ICD-10-CM

## 2020-07-13 DIAGNOSIS — Z811 Family history of alcohol abuse and dependence: Secondary | ICD-10-CM | POA: Diagnosis not present

## 2020-07-13 DIAGNOSIS — I10 Essential (primary) hypertension: Secondary | ICD-10-CM | POA: Diagnosis not present

## 2020-07-13 DIAGNOSIS — Z79899 Other long term (current) drug therapy: Secondary | ICD-10-CM | POA: Diagnosis not present

## 2020-07-13 DIAGNOSIS — Z8052 Family history of malignant neoplasm of bladder: Secondary | ICD-10-CM | POA: Diagnosis not present

## 2020-07-13 HISTORY — PX: COLONOSCOPY WITH PROPOFOL: SHX5780

## 2020-07-13 SURGERY — COLONOSCOPY WITH PROPOFOL
Anesthesia: General

## 2020-07-13 MED ORDER — LIDOCAINE HCL (CARDIAC) PF 100 MG/5ML IV SOSY
PREFILLED_SYRINGE | INTRAVENOUS | Status: DC | PRN
  Administered 2020-07-13: 30 mg via INTRAVENOUS

## 2020-07-13 MED ORDER — LIDOCAINE HCL (PF) 2 % IJ SOLN
INTRAMUSCULAR | Status: AC
  Filled 2020-07-13: qty 5

## 2020-07-13 MED ORDER — PROPOFOL 500 MG/50ML IV EMUL
INTRAVENOUS | Status: DC | PRN
  Administered 2020-07-13: 125 ug/kg/min via INTRAVENOUS

## 2020-07-13 MED ORDER — PROPOFOL 10 MG/ML IV BOLUS
INTRAVENOUS | Status: DC | PRN
  Administered 2020-07-13: 50 mg via INTRAVENOUS
  Administered 2020-07-13: 100 mg via INTRAVENOUS

## 2020-07-13 MED ORDER — SODIUM CHLORIDE 0.9 % IV SOLN: INTRAVENOUS | Status: DC

## 2020-07-13 MED ORDER — PROPOFOL 500 MG/50ML IV EMUL
INTRAVENOUS | Status: AC
  Filled 2020-07-13: qty 50

## 2020-07-13 NOTE — Anesthesia Postprocedure Evaluation (Signed)
Anesthesia Post Note  Patient: Albert Tran  Procedure(s) Performed: COLONOSCOPY WITH PROPOFOL (N/A )  Patient location during evaluation: Endoscopy Anesthesia Type: General Level of consciousness: awake and awake and alert Pain management: pain level controlled Vital Signs Assessment: post-procedure vital signs reviewed and stable Respiratory status: spontaneous breathing and nonlabored ventilation Cardiovascular status: blood pressure returned to baseline and stable Anesthetic complications: no   No complications documented.   Last Vitals:  Vitals:   07/13/20 1248 07/13/20 1257  BP: 138/84 136/84  Pulse: (!) 57 (!) 54  Resp:    Temp:    SpO2: 98% 98%    Last Pain:  Vitals:   07/13/20 1257  TempSrc:   PainSc: 0-No pain                 Emilio Math

## 2020-07-13 NOTE — Op Note (Signed)
[name] Patient Name: Albert Tran Procedure Date: 07/13/2020 12:12 PM MRN: [id number] Account #: [id number] Date of Birth: 1966-07-08 Admit Type: Outpatient Age: 54 Room: [facility] ENDO ROOM 4 Gender: Male Note Status: Finalized Procedure:             Colonoscopy Indications:           Change in stool caliber Providers:             Midge Minium MD, MD Referring MD:          Audrie Gallus. Tower (Referring MD) Medicines:             Propofol per Anesthesia Complications:         No immediate complications. Procedure:             Pre-Anesthesia Assessment:                        - Prior to the procedure, a History and Physical was                         performed, and patient medications and allergies were                         reviewed. The patient's tolerance of previous                         anesthesia was also reviewed. The risks and benefits                         of the procedure and the sedation options and risks                         were discussed with the patient. All questions were                         answered, and informed consent was obtained. Prior                         Anticoagulants: The patient has taken no previous                         anticoagulant or antiplatelet agents. ASA Grade                         Assessment: II - A patient with mild systemic disease.                         After reviewing the risks and benefits, the patient                         was deemed in satisfactory condition to undergo the                         procedure.                        After obtaining informed consent, the colonoscope was  passed under direct vision. Throughout the procedure,                         the patient's blood pressure, pulse, and oxygen                         saturations were monitored continuously. The                         Colonoscope was introduced through the anus and                          advanced to the the cecum, identified by appendiceal                         orifice and ileocecal valve. The colonoscopy was                         performed without difficulty. The patient tolerated                         the procedure well. The quality of the bowel                         preparation was excellent. Findings:      The perianal and digital rectal examinations were normal.      There was evidence of a prior end-to-side colo-colonic anastomosis in       the sigmoid colon. This was patent and was characterized by healthy       appearing mucosa.      Non-bleeding internal hemorrhoids were found during retroflexion. The       hemorrhoids were Grade II (internal hemorrhoids that prolapse but reduce       spontaneously).      The terminal ileum appeared normal. Impression:            - Patent end-to-side colo-colonic anastomosis,                         characterized by healthy appearing mucosa.                        - Non-bleeding internal hemorrhoids.                        - The examined portion of the ileum was normal.                        - No specimens collected. Recommendation:        - Discharge patient to home.                        - Continue present medications.                        - High fiber diet. Procedure Code(s):     --- Professional ---                        [phone], Colonoscopy, flexible; diagnostic, including  collection of specimen(s) by brushing or washing, when                         performed (separate procedure) Diagnosis Code(s):     --- Professional ---                        R19.5, Other fecal abnormalities CPT copyright 2019 American Medical Association. All rights reserved. The codes documented in this report are preliminary and upon coder review may  be revised to meet current compliance requirements. Midge Minium MD, MD 07/13/2020 12:30:54 PM This report has been signed electronically. Number of  Addenda: 0 Note Initiated On: 07/13/2020 12:12 PM Scope Withdrawal Time: 0 hours 8 minutes 17 seconds  Total Procedure Duration: 0 hours 9 minutes 59 seconds  Estimated Blood Loss:  Estimated blood loss: none.      [facility]

## 2020-07-13 NOTE — Interval H&P Note (Signed)
Midge Minium, MD [facility] [address], [address] [place], [place] [address] Phone:[phone] Fax : [phone]  Primary Care Physician:  Tower, Audrie Gallus, MD Primary Gastroenterologist:  Dr. Servando Snare  Pre-Procedure History & Physical: HPI:  Albert Tran is a 54 y.o. male is here for an colonoscopy.   Past Medical History:  Diagnosis Date   Carpal tunnel syndrome    DDD (degenerative disc disease)    Family history of adverse reaction to anesthesia    Nephew had malignant hypothermia.   Fatty liver    Gallstones    Headache(784.0)    likely migraines   Hypertension    Hypertriglyceridemia    Nosebleed    Hx of   Plantar fasciitis    Vertigo, intermittent     Past Surgical History:  Procedure Laterality Date   HERNIA REPAIR     as a teen   KNEE ARTHROSCOPY     LAMINECTOMY     LS disc sx   TONSILLECTOMY      Prior to Admission medications   Medication Sig Start Date End Date Taking? Authorizing Provider  lisinopril-hydrochlorothiazide (ZESTORETIC) 20-25 MG tablet Take 1 tablet by mouth daily. MUST SCHEDULE OFFICE OR VIRTUAL VISIT 01/28/20  Yes Tower, Audrie Gallus, MD  amLODipine (NORVASC) 5 MG tablet Take 5 mg by mouth daily as needed (bp over 150/90).    [provider]  Multiple Vitamin (MULTI-VITAMIN) tablet Take by mouth.    [provider]  Omega-3 Fatty Acids (FISH OIL PO) Take 1 capsule by mouth daily.    [provider]    Allergies as of 07/02/2020 - Review Complete 07/01/2020  Allergen Reaction Noted   Pollen extract  10/27/2014    Family History  Problem Relation Age of Onset   Heart disease Mother        CAD   Alcohol abuse Father    Diabetes Father    Heart disease Father        CAD   Nephrolithiasis Father    Obesity Brother    Heart attack Brother 32   Cancer Paternal Uncle        bladder CA   Heart disease Sister 54       CAD with 4 stents    Social History   Socioeconomic History   Marital status: Married     Spouse name: Not on file   Number of children: Not on file   Years of education: Not on file   Highest education level: Not on file  Occupational History   Not on file  Tobacco Use   Smoking status: Former Smoker    Quit date: 09/05/1987    Years since quitting: 32.8   Smokeless tobacco: Former Neurosurgeon    Types: Associate Professor Use: Never used  Substance and Sexual Activity   Alcohol use: No    Alcohol/week: 0.0 standard drinks   Drug use: No   Sexual activity: Not on file  Other Topics Concern   Not on file  Social History Narrative   Not on file   Social Determinants of Health   Financial Resource Strain:    Difficulty of Paying Living Expenses: Not on file  Food Insecurity:    Worried About Programme researcher, broadcasting/film/video in the Last Year: Not on file   [employer] of Food in the Last Year: Not on file  Transportation Needs:    Lack of Transportation (Medical): Not on file  Lack of Transportation (Non-Medical): Not on file  Physical Activity:    Days of Exercise per Week: Not on file   Minutes of Exercise per Session: Not on file  Stress:    Feeling of Stress : Not on file  Social Connections:    Frequency of Communication with Friends and Family: Not on file   Frequency of Social Gatherings with Friends and Family: Not on file   Attends Religious Services: Not on file   Active Member of Clubs or Organizations: Not on file   Attends Banker Meetings: Not on file   Marital Status: Not on file  Intimate Partner Violence:    Fear of Current or Ex-Partner: Not on file   Emotionally Abused: Not on file   Physically Abused: Not on file   Sexually Abused: Not on file    Review of Systems: See HPI, otherwise negative ROS  Physical Exam: There were no vitals taken for this visit. General:   Alert,  pleasant and cooperative in NAD Head:  Normocephalic and atraumatic. Neck:  Supple; no masses or thyromegaly. Lungs:  Clear throughout to auscultation.    Heart:   Regular rate and rhythm. Abdomen:  Soft, nontender and nondistended. Normal bowel sounds, without guarding, and without rebound.   Neurologic:  Alert and  oriented x4;  grossly normal neurologically.  Impression/Plan: Albert Tran is here for an colonoscopy to be performed for change is stool caliber  Risks, benefits, limitations, and alternatives regarding  colonoscopy have been reviewed with the patient.  Questions have been answered.  All parties agreeable.   Midge Minium, MD  07/13/2020, 12:02 PM

## 2020-07-13 NOTE — Transfer of Care (Signed)
Immediate Anesthesia Transfer of Care Note  Patient: Albert Tran  Procedure(s) Performed: COLONOSCOPY WITH PROPOFOL (N/A )  Patient Location: PACU and Endoscopy Unit  Anesthesia Type:General  Level of Consciousness: awake  Airway & Oxygen Therapy: Patient Spontanous Breathing  Post-op Assessment: Report given to RN  Post vital signs: stable  Last Vitals:  Vitals Value Taken Time  BP 138/83 07/13/20 1238  Temp    Pulse 64 07/13/20 1238  Resp    SpO2 98 % 07/13/20 1238    Last Pain:  Vitals:   07/13/20 1238  TempSrc:   PainSc: 0-No pain         Complications: No complications documented.

## 2020-07-13 NOTE — Anesthesia Preprocedure Evaluation (Signed)
Anesthesia Evaluation  Patient identified by MRN, date of birth, ID band Patient awake    Reviewed: Allergy & Precautions, NPO status , Patient's Chart, lab work & pertinent test results  Airway Mallampati: III   Neck ROM: Full  Mouth opening: Limited Mouth Opening  Dental no notable dental hx.    Pulmonary neg pulmonary ROS, former smoker,    Pulmonary exam normal        Cardiovascular hypertension, negative cardio ROS Normal cardiovascular exam Rhythm:Regular Rate:Normal     Neuro/Psych  Headaches,  Neuromuscular disease negative psych ROS   GI/Hepatic negative GI ROS, Neg liver ROS,   Endo/Other  negative endocrine ROS  Renal/GU negative Renal ROS  negative genitourinary   Musculoskeletal  (+) Arthritis ,   Abdominal   Peds negative pediatric ROS (+)  Hematology negative hematology ROS (+)   Anesthesia Other Findings   Reproductive/Obstetrics negative OB ROS                             Anesthesia Physical Anesthesia Plan  ASA: II  Anesthesia Plan: General   Post-op Pain Management:    Induction: Intravenous  PONV Risk Score and Plan: 2 and Propofol infusion  Airway Management Planned: Nasal Cannula  Additional Equipment: None  Intra-op Plan:   Post-operative Plan:   Informed Consent: I have reviewed the patients History and Physical, chart, labs and discussed the procedure including the risks, benefits and alternatives for the proposed anesthesia with the patient or authorized representative who has indicated his/her understanding and acceptance.       Plan Discussed with: CRNA, Anesthesiologist and Surgeon  Anesthesia Plan Comments:         Anesthesia Quick Evaluation

## 2020-07-14 ENCOUNTER — Encounter: Payer: Self-pay | Admitting: Gastroenterology

## 2020-07-26 ENCOUNTER — Telehealth: Payer: Self-pay

## 2020-07-26 ENCOUNTER — Ambulatory Visit: Payer: Commercial Managed Care - PPO | Admitting: Family Medicine

## 2020-07-26 ENCOUNTER — Other Ambulatory Visit: Payer: Self-pay

## 2020-07-26 VITALS — BP 140/80 | HR 74 | Temp 98.0°F | Wt 210.0 lb

## 2020-07-26 DIAGNOSIS — R1031 Right lower quadrant pain: Secondary | ICD-10-CM | POA: Diagnosis not present

## 2020-07-26 NOTE — Telephone Encounter (Signed)
[facility] - Client TELEPHONE ADVICE RECORD Access[employer]urse Patient Name: Albert Tran Gender: Male DOB: 08-Feb-1966 Age: 54 Y 5 M Return Phone Number: [phone] (Primary) Address: City/State/Zip: [place] [address] Client [facility] Day - Client Client Site [facility] - Day Physician Milinda Antis, Idamae Schuller - MD Contact Type Call Who Is Calling Patient / Member / Family / Caregiver Call Type Triage / Clinical Caller Name Albert Tran Relationship To Patient Self Return Phone Number [phone] (Primary) Chief Complaint ABDOMINAL PAIN - Severe and only in abdomen Reason for Call Symptomatic / Request for Health Information Initial Comment Caller states he has had pain on his right side of the lower abdomen Thursday. Translation No Nurse Assessment Nurse: Tresa Endo, RN, Kim Date/Time (Eastern Time): 07/26/2020 8:50:22 AM Confirm and document reason for call. If symptomatic, describe symptoms. ---Caller states he has pain in the RLQ of his abdomen. States pain started last Wednesday or Thursday, worse over the weekend. States current pain level is 1-2/10, is intermittent and worse with bending or any movement. Does the patient have any new or worsening symptoms? ---Yes Will a triage be completed? ---Yes Related visit to physician within the last 2 weeks? ---No Does the PT have any chronic conditions? (i.e. diabetes, asthma, this includes High risk factors for pregnancy, etc.) ---Yes List chronic conditions. ---Diverticulitis Is this a behavioral health or substance abuse call? ---No Guidelines Guideline Title Affirmed Question Affirmed Notes Nurse Date/Time (Eastern Time) Abdominal Pain - Male [1] MODERATE pain (e.g., interferes with normal activities) AND [2] pain comes and goes (cramps) AND [3] present > 24 hours (Exception: pain with Vomiting or Diarrhea - see that Guideline) Tresa Endo, RN, Kim 07/26/2020  8:52:14 AM PLEASE NOTE: All timestamps contained within this report are represented as [place] Standard Time. CONFIDENTIALTY NOTICE: This fax transmission is intended only for the addressee. It contains information that is legally privileged, confidential or otherwise protected from use or disclosure. If you are not the intended recipient, you are strictly prohibited from reviewing, disclosing, copying using or disseminating any of this information or taking any action in reliance on or regarding this information. If you have received this fax in error, please notify us immediately by telephone so that we can arrange for its return to [place]. Phone: [phone], Toll-Free: [phone], Fax: [phone] Page: 2 of 2 Call Id: [id number] Disp. Time Lamount Cohen Time) Disposition Final User 07/26/2020 8:49:33 AM Send to Urgent Queue Talmadge Coventry 07/26/2020 8:54:39 AM See PCP within 24 Hours Yes Tresa Endo, RN, Tami Lin Disagree/Comply Comply Caller Understands Yes PreDisposition Did not know what to do Care Advice Given Per Guideline SEE PCP WITHIN 24 HOURS: * IF OFFICE WILL BE OPEN: You need to be examined within the next 24 hours. Call your doctor (or NP/PA) when the office opens and make an appointment. CALL BACK IF: * Severe pain lasts over 1 hour * Constant pain lasts over 2 hours * You become worse CARE ADVICE given per Abdominal Pain, Male (Adult) guideline. Referrals REFERRED TO PCP OFFICE

## 2020-07-26 NOTE — Telephone Encounter (Signed)
See clinic note from today

## 2020-07-26 NOTE — Assessment & Plan Note (Signed)
Pt with hx of diverticulitis and surgery for such who presents with RLQ abdominal pain worse with movement. Exam is reassuring and history seems more consistent with possible muscle strain w/o changes in bowel habits or urinary symptoms. Will get blood work and advised ibuprofen for pain. If no improvement return

## 2020-07-26 NOTE — Telephone Encounter (Signed)
Per appt notes pt already has appt to see Dr Selena Batten 07/26/20 at 2:20.

## 2020-07-26 NOTE — Progress Notes (Signed)
Subjective:     Albert Tran is a 54 y.o. male presenting for Flank Pain (x 5 days)     HPI #RLQ abdominal pain - 5 days ago - worse with position changes- like bending, twisting, gettting out of the car - better with rest - cannot think of injury that caused the symptoms    Had diverticulitis surgery in may  - told he had 2 hernias after surgery   Review of Systems  Constitutional: Negative for chills and fever.  Gastrointestinal: Positive for abdominal pain. Negative for blood in stool, constipation, diarrhea, nausea and vomiting.  Genitourinary: Negative for difficulty urinating, dysuria, frequency, testicular pain and urgency.  Musculoskeletal: Negative for arthralgias, back pain and myalgias.     Social History   Tobacco Use  Smoking Status Former Smoker  . Quit date: 09/05/1987  . Years since quitting: 32.9  Smokeless Tobacco Former Neurosurgeon  . Types: Chew        Objective:    BP Readings from Last 3 Encounters:  07/26/20 140/80  07/13/20 136/84  07/01/20 (!) 156/90   Wt Readings from Last 3 Encounters:  07/26/20 210 lb (95.3 kg)  07/13/20 196 lb (88.9 kg)  07/01/20 204 lb 6.4 oz (92.7 kg)    BP 140/80   Pulse 74   Temp 98 F (36.7 C) (Temporal)   Wt 210 lb (95.3 kg)   SpO2 96%   BMI 34.95 kg/m    Physical Exam Constitutional:      Appearance: Normal appearance. He is not ill-appearing or diaphoretic.  HENT:     Right Ear: External ear normal.     Left Ear: External ear normal.     Nose: Nose normal.  Eyes:     General: No scleral icterus.    Extraocular Movements: Extraocular movements intact.     Conjunctiva/sclera: Conjunctivae normal.  Cardiovascular:     Rate and Rhythm: Normal rate and regular rhythm.     Heart sounds: No murmur heard.   Pulmonary:     Effort: Pulmonary effort is normal. No respiratory distress.     Breath sounds: Normal breath sounds. No wheezing.  Abdominal:     General: Abdomen is flat. Bowel sounds are  normal. There is no distension.     Palpations: Abdomen is soft. There is no hepatomegaly or splenomegaly.     Tenderness: There is no abdominal tenderness. There is no right CVA tenderness, left CVA tenderness, guarding or rebound. Negative signs include Murphy's sign.     Comments: Endorses pain when laying down. Several healed surgical scars  Musculoskeletal:     Cervical back: Neck supple.  Skin:    General: Skin is warm and dry.  Neurological:     Mental Status: He is alert. Mental status is at baseline.  Psychiatric:        Mood and Affect: Mood normal.           Assessment & Plan:   Problem List Items Addressed This Visit      Other   Right lower quadrant pain - Primary    Pt with hx of diverticulitis and surgery for such who presents with RLQ abdominal pain worse with movement. Exam is reassuring and history seems more consistent with possible muscle strain w/o changes in bowel habits or urinary symptoms. Will get blood work and advised ibuprofen for pain. If no improvement return      Relevant Orders   Comprehensive metabolic panel   CBC with Differential  Return if symptoms worsen or fail to improve.  Lesleigh Noe, MD  This visit occurred during the SARS-CoV-2 public health emergency.  Safety protocols were in place, including screening questions prior to the visit, additional usage of staff PPE, and extensive cleaning of exam room while observing appropriate contact time as indicated for disinfecting solutions.

## 2020-07-26 NOTE — Patient Instructions (Addendum)
Stretches at home  Try Ibuprofen 600-800 mg   Labs today  ER or return if fevers/chills, nausea/vomiting, inability to pass stool or worsening abdominal pain

## 2020-07-27 LAB — CBC WITH DIFFERENTIAL/PLATELET
Basophils Absolute: 0.1 10*3/uL (ref 0.0–0.1)
Basophils Relative: 0.7 % (ref 0.0–3.0)
Eosinophils Absolute: 0.3 10*3/uL (ref 0.0–0.7)
Eosinophils Relative: 3.5 % (ref 0.0–5.0)
HCT: 39.8 % (ref 39.0–52.0)
Hemoglobin: 13.7 g/dL (ref 13.0–17.0)
Lymphocytes Relative: 28.5 % (ref 12.0–46.0)
Lymphs Abs: 2.3 10*3/uL (ref 0.7–4.0)
MCHC: 34.5 g/dL (ref 30.0–36.0)
MCV: 87.9 fl (ref 78.0–100.0)
Monocytes Absolute: 0.9 10*3/uL (ref 0.1–1.0)
Monocytes Relative: 11.8 % (ref 3.0–12.0)
Neutro Abs: 4.4 10*3/uL (ref 1.4–7.7)
Neutrophils Relative %: 55.5 % (ref 43.0–77.0)
Platelets: 267 10*3/uL (ref 150.0–400.0)
RBC: 4.53 Mil/uL (ref 4.22–5.81)
RDW: 12.7 % (ref 11.5–15.5)
WBC: 7.9 10*3/uL (ref 4.0–10.5)

## 2020-07-27 LAB — COMPREHENSIVE METABOLIC PANEL
ALT: 26 U/L (ref 0–53)
AST: 22 U/L (ref 0–37)
Albumin: 4.4 g/dL (ref 3.5–5.2)
Alkaline Phosphatase: 57 U/L (ref 39–117)
BUN: 17 mg/dL (ref 6–23)
CO2: 28 mEq/L (ref 19–32)
Calcium: 9.2 mg/dL (ref 8.4–10.5)
Chloride: 103 mEq/L (ref 96–112)
Creatinine, Ser: 0.96 mg/dL (ref 0.40–1.50)
GFR: 90.3 mL/min (ref >60.00)
Glucose, Bld: 86 mg/dL (ref 70–99)
Potassium: 3.8 mEq/L (ref 3.5–5.1)
Sodium: 139 mEq/L (ref 135–145)
Total Bilirubin: 0.6 mg/dL (ref 0.2–1.2)
Total Protein: 7.3 g/dL (ref 6.0–8.3)

## 2020-11-24 ENCOUNTER — Other Ambulatory Visit: Payer: Self-pay | Admitting: Family Medicine

## 2020-11-25 NOTE — Telephone Encounter (Signed)
Pt has had recent acute appts but hasn't had a f/u or CPE since 2018, please advise

## 2020-11-25 NOTE — Telephone Encounter (Signed)
Med refilled once and Carrie will reach out to pt to try and get appt scheduled  

## 2020-11-25 NOTE — Telephone Encounter (Signed)
Please schedule f/u in May and refill until then 

## 2021-01-10 ENCOUNTER — Encounter: Payer: Self-pay | Admitting: Family Medicine

## 2021-01-10 ENCOUNTER — Other Ambulatory Visit: Payer: Self-pay

## 2021-01-10 ENCOUNTER — Ambulatory Visit: Payer: Commercial Managed Care - PPO | Admitting: Family Medicine

## 2021-01-10 VITALS — BP 124/72 | HR 68 | Temp 97.0°F | Ht 65.0 in | Wt 205.1 lb

## 2021-01-10 DIAGNOSIS — E66811 Obesity, class 1: Secondary | ICD-10-CM

## 2021-01-10 DIAGNOSIS — I10 Essential (primary) hypertension: Secondary | ICD-10-CM | POA: Diagnosis not present

## 2021-01-10 DIAGNOSIS — E6609 Other obesity due to excess calories: Secondary | ICD-10-CM

## 2021-01-10 DIAGNOSIS — K760 Fatty (change of) liver, not elsewhere classified: Secondary | ICD-10-CM

## 2021-01-10 DIAGNOSIS — E781 Pure hyperglyceridemia: Secondary | ICD-10-CM

## 2021-01-10 DIAGNOSIS — Z6834 Body mass index (BMI) 34.0-34.9, adult: Secondary | ICD-10-CM

## 2021-01-10 MED ORDER — LISINOPRIL-HYDROCHLOROTHIAZIDE 20-25 MG PO TABS
1.0000 | ORAL_TABLET | Freq: Every day | ORAL | 2 refills | Status: DC
Start: 2021-01-10 — End: 2021-10-27

## 2021-01-10 NOTE — Assessment & Plan Note (Signed)
bp in fair control at this time  BP Readings from Last 1 Encounters:  01/10/21 124/72   No changes needed Most recent labs reviewed  Disc lifstyle change with low sodium diet and exercise  Plan to continue lisinopril hct 20-25 mg daily  Prn amlodipine for spiking bp  Labs planned  Enc good health habits

## 2021-01-10 NOTE — Assessment & Plan Note (Signed)
Discussed how this problem influences overall health and the risks it imposes  Reviewed plan for weight loss with lower calorie diet (via better food choices and also portion control or program like weight watchers) and exercise building up to or more than 30 minutes 5 days per week including some aerobic activity    

## 2021-01-10 NOTE — Progress Notes (Signed)
Subjective:    Patient ID: Albert Tran, male    DOB: 1966-05-24, 55 y.o.   MRN: [id number]  This visit occurred during the SARS-CoV-2 public health emergency.  Safety protocols were in place, including screening questions prior to the visit, additional usage of staff PPE, and extensive cleaning of exam room while observing appropriate contact time as indicated for disinfecting solutions.    HPI Pt presents for f/u of chronic health problems /med refill for HTN   Wt Readings from Last 3 Encounters:  01/10/21 205 lb 2 oz (93 kg)  07/26/20 210 lb (95.3 kg)  07/13/20 196 lb (88.9 kg)   34.13 kg/m Lost some wt  Is eating better since he had his diverticular surgery  Does eat healthy  Some veggies do bother him   HTN bp is stable today  No cp or palpitations or headaches or edema  No side effects to medicines  BP Readings from Last 3 Encounters:  01/10/21 124/72  07/26/20 140/80  07/13/20 136/84     Pulse Readings from Last 3 Encounters:  01/10/21 68  07/26/20 74  07/13/20 (!) 54    Taking amlodipine 5 mg (he takes this prn when bp is elevated)  Starting to track it more  Lisinopril hct 20-25 mg daily   Lab Results  Component Value Date   CREATININE 0.96 07/26/2020   BUN 17 07/26/2020   NA 139 07/26/2020   K 3.8 07/26/2020   CL 103 07/26/2020   CO2 28 07/26/2020   Lab Results  Component Value Date   ALT 26 07/26/2020   AST 22 07/26/2020   ALKPHOS 57 07/26/2020   BILITOT 0.6 07/26/2020   Lab Results  Component Value Date   CHOL 265 (H) 05/08/2017   HDL 37.70 (L) 05/08/2017   LDLDIRECT 128.0 05/08/2017   TRIG (H) 05/08/2017    1080.0 Triglyceride is over 400; calculations on Lipids are invalid.   CHOLHDL 7 05/08/2017   Egg/toast for bkfast Ham and cheese /egg   Sometimes butter and cheese   Has not taken fenofibrate  He worries about fatty liver  Does not drink alcohol   Patient Active Problem List   Diagnosis Date Noted  . Right lower  quadrant pain 07/26/2020  . Change in stool caliber   . Swelling of left hand 01/28/2020  . S/P laparoscopic-assisted sigmoidectomy 01/06/2020  . Night sweats 06/07/2018  . Dizziness 06/07/2018  . Dyslipidemia 09/13/2016  . Family history of early CAD 09/12/2016  . Disorder of patella 11/15/2015  . Stress reaction 09/14/2015  . Essential hypertension 08/09/2015  . Obesity 08/09/2015  . Snoring 03/22/2011  . Fatty liver 06/29/2010  . Cholelithiasis 06/14/2010  . Hypertriglyceridemia 06/09/2010  . HYPERBILIRUBINEMIA 06/09/2010  . Headache 06/09/2010  . TRANSAMINASES, SERUM, ELEVATED 06/09/2010  . Open fracture of ankle 06/09/2010  . Plantar fasciitis, left 04/05/2010  . CARPAL TUNNEL SYNDROME 10/11/2007  . VERTIGO 09/10/2007   Past Medical History:  Diagnosis Date  . Carpal tunnel syndrome   . DDD (degenerative disc disease)   . Family history of adverse reaction to anesthesia    Nephew had malignant hypothermia.  . Fatty liver   . Gallstones   . Headache(784.0)    likely migraines  . Hypertension   . Hypertriglyceridemia   . Nosebleed    Hx of  . Plantar fasciitis   . Vertigo, intermittent    Past Surgical History:  Procedure Laterality Date  . COLONOSCOPY WITH PROPOFOL N/A 07/13/2020  Procedure: COLONOSCOPY WITH PROPOFOL;  Surgeon: Midge Minium, MD;  Location: [facility] ENDOSCOPY;  Service: Endoscopy;  Laterality: N/A;  . HERNIA REPAIR     as a teen  . KNEE ARTHROSCOPY    . LAMINECTOMY     LS disc sx  . TONSILLECTOMY     Social History   Tobacco Use  . Smoking status: Former Smoker    Quit date: 09/05/1987    Years since quitting: 33.3  . Smokeless tobacco: Former Neurosurgeon    Types: Engineer, drilling  . Vaping Use: Never used  Substance Use Topics  . Alcohol use: No    Alcohol/week: 0.0 standard drinks  . Drug use: No   Family History  Problem Relation Age of Onset  . Heart disease Mother        CAD  . Alcohol abuse Father   . Diabetes Father   . Heart  disease Father        CAD  . Nephrolithiasis Father   . Obesity Brother   . Heart attack Brother 48  . Cancer Paternal Uncle        bladder CA  . Heart disease Sister 78       CAD with 4 stents   Allergies  Allergen Reactions  . Pollen Extract    Current Outpatient Medications on File Prior to Visit  Medication Sig Dispense Refill  . amLODipine (NORVASC) 5 MG tablet Take 5 mg by mouth daily as needed (bp over 150/90).    . Multiple Vitamin (MULTI-VITAMIN) tablet Take by mouth.    . Omega-3 Fatty Acids (FISH OIL PO) Take 1 capsule by mouth daily.     No current facility-administered medications on file prior to visit.    Review of Systems  Constitutional: Positive for fatigue. Negative for activity change, appetite change, fever and unexpected weight change.  HENT: Negative for congestion, rhinorrhea, sore throat and trouble swallowing.   Eyes: Negative for pain, redness, itching and visual disturbance.  Respiratory: Negative for cough, chest tightness, shortness of breath and wheezing.   Cardiovascular: Negative for chest pain and palpitations.  Gastrointestinal: Negative for abdominal pain, blood in stool, constipation, diarrhea and nausea.       Stools are small caliber since his colon surgery  Endocrine: Negative for cold intolerance, heat intolerance, polydipsia and polyuria.  Genitourinary: Negative for difficulty urinating, dysuria, frequency and urgency.  Musculoskeletal: Negative for arthralgias, joint swelling and myalgias.  Skin: Negative for pallor and rash.  Neurological: Negative for dizziness, tremors, weakness, numbness and headaches.  Hematological: Negative for adenopathy. Does not bruise/bleed easily.  Psychiatric/Behavioral: Negative for decreased concentration and dysphoric mood. The patient is not nervous/anxious.        Stressors       Objective:   Physical Exam Constitutional:      General: He is not in acute distress.    Appearance: Normal  appearance. He is well-developed. He is obese. He is not ill-appearing or diaphoretic.  HENT:     Head: Normocephalic and atraumatic.     Mouth/Throat:     Mouth: Mucous membranes are moist.  Eyes:     General: No scleral icterus.    Conjunctiva/sclera: Conjunctivae normal.     Pupils: Pupils are equal, round, and reactive to light.  Neck:     Thyroid: No thyromegaly.     Vascular: No carotid bruit or JVD.  Cardiovascular:     Rate and Rhythm: Normal rate and regular rhythm.  Heart sounds: Normal heart sounds. No gallop.   Pulmonary:     Effort: Pulmonary effort is normal. No respiratory distress.     Breath sounds: Normal breath sounds. No wheezing or rales.  Abdominal:     General: Bowel sounds are normal. There is no distension or abdominal bruit.     Palpations: Abdomen is soft. There is no mass.     Tenderness: There is no abdominal tenderness.  Musculoskeletal:     Cervical back: Normal range of motion and neck supple.     Right lower leg: No edema.     Left lower leg: No edema.  Lymphadenopathy:     Cervical: No cervical adenopathy.  Skin:    General: Skin is warm and dry.     Coloration: Skin is not pale.     Findings: No erythema or rash.  Neurological:     Mental Status: He is alert.     Cranial Nerves: No cranial nerve deficit.     Coordination: Coordination normal.     Deep Tendon Reflexes: Reflexes are normal and symmetric. Reflexes normal.  Psychiatric:        Mood and Affect: Mood normal.           Assessment & Plan:   Problem List Items Addressed This Visit      Cardiovascular and Mediastinum   Essential hypertension - Primary    bp in fair control at this time  BP Readings from Last 1 Encounters:  01/10/21 124/72   No changes needed Most recent labs reviewed  Disc lifstyle change with low sodium diet and exercise  Plan to continue lisinopril hct 20-25 mg daily  Prn amlodipine for spiking bp  Labs planned  Enc good health habits        Relevant Medications   lisinopril-hydrochlorothiazide (ZESTORETIC) 20-25 MG tablet     Digestive   Fatty liver    Nl hepatic labs in the fall  Will add to upcoming draw  Diet is good  No alcohol or acetaminophen  No symptoms         Other   Hypertriglyceridemia    Disc goals for lipids and reasons to control them Rev last labs with pt Rev low sat fat diet in detail Due for re check- fasting draw scheduled  Fairy good diet habits  May need fibrate in the future- last trig over 1000      Relevant Medications   lisinopril-hydrochlorothiazide (ZESTORETIC) 20-25 MG tablet   Obesity    Discussed how this problem influences overall health and the risks it imposes  Reviewed plan for weight loss with lower calorie diet (via better food choices and also portion control or program like weight watchers) and exercise building up to or more than 30 minutes 5 days per week including some aerobic activity

## 2021-01-10 NOTE — Assessment & Plan Note (Signed)
Nl hepatic labs in the fall  Will add to upcoming draw  Diet is good  No alcohol or acetaminophen  No symptoms

## 2021-01-10 NOTE — Patient Instructions (Addendum)
For cholesterol  Avoid red meat/ fried foods/ egg yolks/ fatty breakfast meats/ butter, cheese and high fat dairy/ and shellfish    Take care of yourself  Keep hydrated Blood pressure looks good   Stop up front to set up fasting lab appointment and we will also check cholesterol

## 2021-01-10 NOTE — Assessment & Plan Note (Signed)
Disc goals for lipids and reasons to control them Rev last labs with pt Rev low sat fat diet in detail Due for re check- fasting draw scheduled  Fairy good diet habits  May need fibrate in the future- last trig over 1000

## 2021-01-13 ENCOUNTER — Other Ambulatory Visit: Payer: Self-pay

## 2021-01-13 ENCOUNTER — Other Ambulatory Visit (INDEPENDENT_AMBULATORY_CARE_PROVIDER_SITE_OTHER): Payer: Commercial Managed Care - PPO

## 2021-01-13 DIAGNOSIS — K760 Fatty (change of) liver, not elsewhere classified: Secondary | ICD-10-CM | POA: Diagnosis not present

## 2021-01-13 DIAGNOSIS — E781 Pure hyperglyceridemia: Secondary | ICD-10-CM

## 2021-01-13 DIAGNOSIS — I10 Essential (primary) hypertension: Secondary | ICD-10-CM

## 2021-01-13 LAB — COMPREHENSIVE METABOLIC PANEL
ALT: 29 U/L (ref 0–53)
AST: 22 U/L (ref 0–37)
Albumin: 4.2 g/dL (ref 3.5–5.2)
Alkaline Phosphatase: 51 U/L (ref 39–117)
BUN: 13 mg/dL (ref 6–23)
CO2: 29 mEq/L (ref 19–32)
Calcium: 9.3 mg/dL (ref 8.4–10.5)
Chloride: 105 mEq/L (ref 96–112)
Creatinine, Ser: 1.07 mg/dL (ref 0.40–1.50)
GFR: 79.02 mL/min (ref >60.00)
Glucose, Bld: 117 mg/dL — ABNORMAL HIGH (ref 70–99)
Potassium: 4.1 mEq/L (ref 3.5–5.1)
Sodium: 141 mEq/L (ref 135–145)
Total Bilirubin: 0.6 mg/dL (ref 0.2–1.2)
Total Protein: 7 g/dL (ref 6.0–8.3)

## 2021-01-13 LAB — LIPID PANEL
Cholesterol: 206 mg/dL — ABNORMAL HIGH (ref 0–200)
HDL: 41.4 mg/dL (ref >39.00)
LDL Cholesterol: 130 mg/dL — ABNORMAL HIGH (ref 0–99)
NonHDL: 164.73
Total CHOL/HDL Ratio: 5
Triglycerides: 172 mg/dL — ABNORMAL HIGH (ref 0.0–149.0)
VLDL: 34.4 mg/dL (ref 0.0–40.0)

## 2021-01-19 ENCOUNTER — Telehealth: Payer: Self-pay | Admitting: *Deleted

## 2021-01-19 NOTE — Telephone Encounter (Signed)
-----   Message from Judy Pimple, MD sent at 01/14/2021  1:13 PM EDT ----- Glucose is 117 fasting, to prevent DM2 I want to have him work on lower glycemic diet  Try to get most of your carbohydrates from produce (with the exception of white potatoes)  Eat less bread/pasta/rice/snack foods/cereals/sweets and other items from the middle of the grocery store (processed carbs) Triglycerides are much improved but LDL cholesterol is now high Avoid red meat/ fried foods/ egg yolks/ fatty breakfast meats/ butter, cheese and high fat dairy/ and shellfish   I would like to re check lipid with A1c in 1-2 mo after diet change

## 2021-01-19 NOTE — Telephone Encounter (Signed)
Left VM requesting pt to call the office back 

## 2021-08-08 ENCOUNTER — Other Ambulatory Visit: Payer: Self-pay

## 2021-08-08 ENCOUNTER — Ambulatory Visit: Payer: Commercial Managed Care - PPO | Admitting: Family Medicine

## 2021-08-08 ENCOUNTER — Encounter: Payer: Self-pay | Admitting: Family Medicine

## 2021-08-08 DIAGNOSIS — M65312 Trigger thumb, left thumb: Secondary | ICD-10-CM | POA: Diagnosis not present

## 2021-08-08 DIAGNOSIS — M65319 Trigger thumb, unspecified thumb: Secondary | ICD-10-CM | POA: Insufficient documentation

## 2021-08-08 MED ORDER — MELOXICAM 15 MG PO TABS: 15.0000 mg | ORAL_TABLET | Freq: Every day | ORAL | 1 refills | Status: DC | PRN

## 2021-08-08 NOTE — Patient Instructions (Addendum)
Use ice/cold compress on thumb area (10 minutes)  Splint it if that helps pain  Do range of motion exercises when you can   Try meloxicam instead of ibuprofen   Stop at check out so we can get an appt with Dr Patsy Lager   If symptoms change in the meantime let me know

## 2021-08-08 NOTE — Progress Notes (Signed)
Subjective:    Patient ID: Albert Tran, male    DOB: 06/01/1966, 55 y.o.   MRN: [id number]  This visit occurred during the SARS-CoV-2 public health emergency.  Safety protocols were in place, including screening questions prior to the visit, additional usage of staff PPE, and extensive cleaning of exam room while observing appropriate contact time as indicated for disinfecting solutions.   HPI Pt presents with L thumb complaint   Wt Readings from Last 3 Encounters:  08/08/21 201 lb (91.2 kg)  01/10/21 205 lb 2 oz (93 kg)  07/26/20 210 lb (95.3 kg)   33.45 kg/m  L thumb  2 weeks of triggering (distal joint)  Pain at the base of the thumb Swollen at times   He cares for his wife   When on vacation - he had some triggering of index and 5th finger of R hand, then thumb-this went away The L hand symptoms did not improved    Right handed  Has carpal tunnel in that hand  Relies on L hand more   Ibuprofen 4-5 days  Splints it at night  Keeps moving- learned some   Patient Active Problem List   Diagnosis Date Noted   Trigger thumb 08/08/2021   Right lower quadrant pain 07/26/2020   Change in stool caliber    Swelling of left hand 01/28/2020   S/P laparoscopic-assisted sigmoidectomy 01/06/2020   Night sweats 06/07/2018   Dizziness 06/07/2018   Dyslipidemia 09/13/2016   Family history of early CAD 09/12/2016   Disorder of patella 11/15/2015   Stress reaction 09/14/2015   Essential hypertension 08/09/2015   Obesity 08/09/2015   Snoring 03/22/2011   Fatty liver 06/29/2010   Cholelithiasis 06/14/2010   Hypertriglyceridemia 06/09/2010   HYPERBILIRUBINEMIA 06/09/2010   Headache 06/09/2010   TRANSAMINASES, SERUM, ELEVATED 06/09/2010   Open fracture of ankle 06/09/2010   Plantar fasciitis, left 04/05/2010   CARPAL TUNNEL SYNDROME 10/11/2007   VERTIGO 09/10/2007   Past Medical History:  Diagnosis Date   Carpal tunnel syndrome    DDD (degenerative disc disease)     Family history of adverse reaction to anesthesia    Nephew had malignant hypothermia.   Fatty liver    Gallstones    Headache(784.0)    likely migraines   Hypertension    Hypertriglyceridemia    Nosebleed    Hx of   Plantar fasciitis    Vertigo, intermittent    Past Surgical History:  Procedure Laterality Date   COLONOSCOPY WITH PROPOFOL N/A 07/13/2020   Procedure: COLONOSCOPY WITH PROPOFOL;  Surgeon: Midge Minium, MD;  Location: [facility] ENDOSCOPY;  Service: Endoscopy;  Laterality: N/A;   HERNIA REPAIR     as a teen   KNEE ARTHROSCOPY     LAMINECTOMY     LS disc sx   TONSILLECTOMY     Social History   Tobacco Use   Smoking status: Former   Smokeless tobacco: Former    Types: Associate Professor Use: Never used  Substance Use Topics   Alcohol use: No    Alcohol/week: 0.0 standard drinks   Drug use: No   Family History  Problem Relation Age of Onset   Heart disease Mother        CAD   Alcohol abuse Father    Diabetes Father    Heart disease Father        CAD   Nephrolithiasis Father    Obesity Brother    Heart attack Brother  69   Cancer Paternal Uncle        bladder CA   Heart disease Sister 38       CAD with 4 stents   Allergies  Allergen Reactions   Pollen Extract    Current Outpatient Medications on File Prior to Visit  Medication Sig Dispense Refill   amLODipine (NORVASC) 5 MG tablet Take 5 mg by mouth daily as needed (bp over 150/90).     lisinopril-hydrochlorothiazide (ZESTORETIC) 20-25 MG tablet Take 1 tablet by mouth daily. 90 tablet 2   Multiple Vitamin (MULTI-VITAMIN) tablet Take by mouth.     Omega-3 Fatty Acids (FISH OIL PO) Take 1 capsule by mouth daily.     No current facility-administered medications on file prior to visit.      Review of Systems  Constitutional:  Negative for activity change, appetite change, fatigue, fever and unexpected weight change.  HENT:  Negative for congestion, rhinorrhea, sore throat and trouble  swallowing.   Eyes:  Negative for pain, redness, itching and visual disturbance.  Respiratory:  Negative for cough, chest tightness, shortness of breath and wheezing.   Cardiovascular:  Negative for chest pain and palpitations.  Gastrointestinal:  Negative for abdominal pain, blood in stool, constipation, diarrhea and nausea.  Endocrine: Negative for cold intolerance, heat intolerance, polydipsia and polyuria.  Genitourinary:  Negative for difficulty urinating, dysuria, frequency and urgency.  Musculoskeletal:  Positive for arthralgias. Negative for joint swelling and myalgias.       L thumb gets locked in flexion and hurts at base of it  Skin:  Negative for pallor and rash.  Neurological:  Negative for dizziness, tremors, weakness, numbness and headaches.  Hematological:  Negative for adenopathy. Does not bruise/bleed easily.  Psychiatric/Behavioral:  Negative for decreased concentration and dysphoric mood. The patient is not nervous/anxious.       Objective:   Physical Exam Constitutional:      General: He is not in acute distress.    Appearance: Normal appearance. He is obese. He is not ill-appearing.  Eyes:     Conjunctiva/sclera: Conjunctivae normal.     Pupils: Pupils are equal, round, and reactive to light.  Cardiovascular:     Rate and Rhythm: Normal rate and regular rhythm.     Heart sounds: Normal heart sounds.  Pulmonary:     Effort: Pulmonary effort is normal. No respiratory distress.     Breath sounds: Normal breath sounds. No wheezing.  Musculoskeletal:        General: Swelling and tenderness present.     Cervical back: Neck supple.     Comments: Some tenderness at the base of L thumb (first MCP joint), with mild swelling  Mild crepitus  Some triggering of distal pip as well (painful to adjust this)   No erythema or signs of infx No neuro changes   Lymphadenopathy:     Cervical: No cervical adenopathy.  Skin:    General: Skin is warm and dry.     Findings: No  erythema.  Neurological:     Mental Status: He is alert.     Cranial Nerves: No cranial nerve deficit.     Sensory: No sensory deficit.  Psychiatric:        Mood and Affect: Mood normal.          Assessment & Plan:   Problem List Items Addressed This Visit       Musculoskeletal and Integument   Trigger thumb    L distal first pip joint  triggers, the MCP joint is tender and mildly swollen  Suspect inflammation/poss OA  Pt is symptomatic -having to reduce finger when stuck in flexion  He shoots pistols as a hobby-this may have worsened things  Px meloxicam 15 mg daily prn with food  Encouraged use of cold compress Splint if very uncomfortable  Referral done to sport med to consider injection or other treatment

## 2021-08-08 NOTE — Assessment & Plan Note (Signed)
L distal first pip joint triggers, the MCP joint is tender and mildly swollen  Suspect inflammation/poss OA  Pt is symptomatic -having to reduce finger when stuck in flexion  He shoots pistols as a hobby-this may have worsened things  Px meloxicam 15 mg daily prn with food  Encouraged use of cold compress Splint if very uncomfortable  Referral done to sport med to consider injection or other treatment

## 2021-08-15 ENCOUNTER — Ambulatory Visit: Payer: Commercial Managed Care - PPO | Admitting: Family Medicine

## 2021-08-15 ENCOUNTER — Encounter: Payer: Self-pay | Admitting: Family Medicine

## 2021-08-15 ENCOUNTER — Other Ambulatory Visit: Payer: Self-pay

## 2021-08-15 VITALS — BP 154/102 | HR 68 | Temp 97.4°F | Ht 65.0 in | Wt 204.4 lb

## 2021-08-15 DIAGNOSIS — G5601 Carpal tunnel syndrome, right upper limb: Secondary | ICD-10-CM | POA: Diagnosis not present

## 2021-08-15 DIAGNOSIS — M65312 Trigger thumb, left thumb: Secondary | ICD-10-CM | POA: Diagnosis not present

## 2021-08-15 MED ORDER — TRIAMCINOLONE ACETONIDE 40 MG/ML IJ SUSP
20.0000 mg | Freq: Once | INTRAMUSCULAR | Status: AC
  Administered 2021-08-15: 20 mg via INTRA_ARTICULAR

## 2021-08-15 NOTE — Progress Notes (Signed)
Albert Nader T. Wrenn Willcox, MD, CAQ [facility] [address] [place], [address]  Phone: [phone]  FAX: [phone]  [name] - 55 y.o. male  MRN [id number]  Date of Birth: 07/16/1966  Date: 08/15/2021  PCP: Albert Pimple, MD  Referral: Albert Pimple, MD  Chief Complaint  Patient presents with  . Trigger Finger    Left Thumb  . Wrist Pain    Right    This visit occurred during the SARS-CoV-2 public health emergency.  Safety protocols were in place, including screening questions prior to the visit, additional usage of staff PPE, and extensive cleaning of exam room while observing appropriate contact time as indicated for disinfecting solutions.   Subjective:   Albert Tran is a 55 y.o. very pleasant male patient with Body mass index is 34.01 kg/m. who presents with the following:  He presents with 2 primary issues today.  1.  He has a trigger thumb.  This is on the left side.  Relatively recently also had some triggering on the second and fifth digits, but that is resolved at this point.  Right now he is having painful triggering daily, and he also has some pain on the volar aspect of the thumb.  2.  He has longstanding carpal tunnel syndrome on the right side.  This is been going on for years now.  He does have a carpal tunnel brace that he wears at nighttime, but now he is having pain and symptoms with numbness, tingling, as well as decreased grip strength on the right side.  This is notably gotten worse acutely in the last few weeks.  Review of Systems is noted in the HPI, as appropriate  Patient Active Problem List   Diagnosis Date Noted  . Trigger thumb 08/08/2021  . Right lower quadrant pain 07/26/2020  . Change in stool caliber   . Swelling of left hand 01/28/2020  . S/P laparoscopic-assisted sigmoidectomy 01/06/2020  . Night sweats 06/07/2018  . Dizziness 06/07/2018  . Dyslipidemia 09/13/2016   . Family history of early CAD 09/12/2016  . Disorder of patella 11/15/2015  . Stress reaction 09/14/2015  . Essential hypertension 08/09/2015  . Obesity 08/09/2015  . Snoring 03/22/2011  . Fatty liver 06/29/2010  . Cholelithiasis 06/14/2010  . Hypertriglyceridemia 06/09/2010  . HYPERBILIRUBINEMIA 06/09/2010  . Headache 06/09/2010  . TRANSAMINASES, SERUM, ELEVATED 06/09/2010  . Open fracture of ankle 06/09/2010  . Plantar fasciitis, left 04/05/2010  . CARPAL TUNNEL SYNDROME 10/11/2007  . VERTIGO 09/10/2007    Past Medical History:  Diagnosis Date  . Carpal tunnel syndrome   . DDD (degenerative disc disease)   . Family history of adverse reaction to anesthesia    Nephew had malignant hypothermia.  . Fatty liver   . Gallstones   . Headache(784.0)    likely migraines  . Hypertension   . Hypertriglyceridemia   . Nosebleed    Hx of  . Plantar fasciitis   . Vertigo, intermittent     Past Surgical History:  Procedure Laterality Date  . COLONOSCOPY WITH PROPOFOL N/A 07/13/2020   Procedure: COLONOSCOPY WITH PROPOFOL;  Surgeon: Midge Minium, MD;  Location: [facility] ENDOSCOPY;  Service: Endoscopy;  Laterality: N/A;  . HERNIA REPAIR     as a teen  . KNEE ARTHROSCOPY    . LAMINECTOMY     LS disc sx  . TONSILLECTOMY      Family History  Problem Relation Age of  Onset  . Heart disease Mother        CAD  . Alcohol abuse Father   . Diabetes Father   . Heart disease Father        CAD  . Nephrolithiasis Father   . Obesity Brother   . Heart attack Brother 48  . Cancer Paternal Uncle        bladder CA  . Heart disease Sister 44       CAD with 4 stents     Objective:   BP (!) 154/102   Pulse 68   Temp (!) 97.4 F (36.3 C) (Temporal)   Ht 5\' 5"  (1.651 m)   Wt 204 lb 6 oz (92.7 kg)   SpO2 95%   BMI 34.01 kg/m   GEN: No acute distress; alert,appropriate. PULM: Breathing comfortably in no respiratory distress PSYCH: Normally interactive.   Left hand and wrist: He  does have obvious triggering and a palpable nodule and left first digit.  Remainder of the hand and wrist exam is normal throughout all digits, hand and wrist.  Right hand: Nontender throughout all digits, metacarpals, wrist, distal radius and ulna.  He has immediate inducible numbness and tingling with Tinel's.  He also has inducible tingling and numbness with Phalen's maneuver very quickly.  Laboratory and Imaging Data:  Assessment and Plan:     ICD-10-CM   1. Trigger thumb, left thumb  M65.312 triamcinolone acetonide (KENALOG-40) injection 20 mg    2. Carpal tunnel syndrome, right  G56.01 Ambulatory referral to Neurology     Symptomatic left trigger thumb.  We discussed options, and we are going to do tendon sheath injection today.  Longstanding carpal tunnel syndrome on the right progressive over years.  Has failed conservative measures thus far.  She is quite symptomatic daily, and thinks that he is also losing some dexterity and strength in the right hand.  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  I am going to hold off doing a carpal tunnel injection, because this would impair nerve conduction studies.  I do think that this needs to be evaluated and addressed sooner rather than later so that the patient does not risk permanent nerve impairment.  We are going to ask neurology to do EMG and nerve conduction velocities.  Assuming that these are positive, I will consult hand surgery.  Tendon Sheath Injection Procedure Note Albert Tran July 12, 1966 Date of procedure: 08/15/2021  Procedure: Tendon Sheath Injection for Trigger Finger, L 1st Indications: Pain  Procedure Details Verbal consent was obtained. Risks (including potential risk for skin lightening and potential atrophy), benefits and alternatives were discussed. Prepped with Chloraprep and Ethyl Chloride used for anesthesia. Under sterile conditions, patient injected at palmar crease aiming  distally with 45 degree angle towards nodule; injected directly into tendon sheath. Medication flowed freely without resistance.  Needle size: 22 gauge 1 1/2 inch Injection: 1/2 cc of Lidocaine 1% and Kenalog 20 mg Medication: 1/2 cc of Kenalog 40 mg (equaling Kenalog 20 mg)   We discussed the pathophysiology of trigger fingers. Discussed the inflammatory nature of nodule creation and likely nodule abutting the A1 pulley system, this causing the patient's discomfort and sensations. We discussed that treatments for this include direct injection into the tendon sheath to attempt to shrink catching tissue. This can be done 1-2 times. Other treatments include surgical release. If the patient fails to trigger finger injections, I would recommend trigger finger release if the  patient desires relief of the symptoms.   Meds ordered this encounter  Medications  . triamcinolone acetonide (KENALOG-40) injection 20 mg   There are no discontinued medications. Orders Placed This Encounter  Procedures  . Ambulatory referral to Neurology    Follow-up: No follow-ups on file.  Dragon [employer] One speech-to-text software was used for transcription in this dictation.  Possible transcriptional errors can occur using Animal nutritionist.   Signed,  Elpidio Galea. Edythe Riches, MD   Outpatient Encounter Medications as of 08/15/2021  Medication Sig  . amLODipine (NORVASC) 5 MG tablet Take 5 mg by mouth daily as needed (bp over 150/90).  Marland Kitchen lisinopril-hydrochlorothiazide (ZESTORETIC) 20-25 MG tablet Take 1 tablet by mouth daily.  . meloxicam (MOBIC) 15 MG tablet Take 1 tablet (15 mg total) by mouth daily as needed for pain. With food  . Multiple Vitamin (MULTI-VITAMIN) tablet Take by mouth.  . Omega-3 Fatty Acids (FISH OIL PO) Take 1 capsule by mouth daily.  . [EXPIRED] triamcinolone acetonide (KENALOG-40) injection 20 mg    No facility-administered encounter medications on file as of 08/15/2021.

## 2021-09-01 ENCOUNTER — Telehealth: Payer: Self-pay | Admitting: Family Medicine

## 2021-09-01 NOTE — Telephone Encounter (Signed)
Albert Tran with [facility] called stating that pt wanted a nerve study. Albert Tran is asking  do pt need to be consulted before having the nerve study. Please advise.

## 2021-09-01 NOTE — Telephone Encounter (Signed)
Per Dr Cyndie Chime referral :  Consult [facility]  Neurology for EMG/nerve conduction velocity studies of the right hand only.  Formal neurological consult outside of EMG is not needed.  He just wants to test, thanks  For carpal tunnel

## 2021-09-02 NOTE — Telephone Encounter (Signed)
Left VM on Nerissa's VM letting her know Dr. Royden Purl comments

## 2021-10-02 ENCOUNTER — Other Ambulatory Visit: Payer: Self-pay | Admitting: Family Medicine

## 2021-10-26 ENCOUNTER — Other Ambulatory Visit: Payer: Self-pay | Admitting: Family Medicine

## 2021-12-21 ENCOUNTER — Ambulatory Visit: Payer: Commercial Managed Care - PPO | Admitting: Family Medicine

## 2021-12-21 ENCOUNTER — Encounter: Payer: Self-pay | Admitting: Family Medicine

## 2021-12-21 VITALS — BP 120/80 | HR 55 | Temp 98.4°F | Ht 65.0 in | Wt 201.1 lb

## 2021-12-21 DIAGNOSIS — M2550 Pain in unspecified joint: Secondary | ICD-10-CM

## 2021-12-21 DIAGNOSIS — M2540 Effusion, unspecified joint: Secondary | ICD-10-CM

## 2021-12-21 DIAGNOSIS — M65311 Trigger thumb, right thumb: Secondary | ICD-10-CM | POA: Diagnosis not present

## 2021-12-21 LAB — URIC ACID: Uric Acid, Serum: 7.1 mg/dL (ref 4.0–7.8)

## 2021-12-21 LAB — SEDIMENTATION RATE: Sed Rate: 27 mm/hr — ABNORMAL HIGH (ref 0–20)

## 2021-12-21 MED ORDER — TRIAMCINOLONE ACETONIDE 40 MG/ML IJ SUSP
20.0000 mg | Freq: Once | INTRAMUSCULAR | Status: AC
  Administered 2021-12-21: 20 mg via INTRA_ARTICULAR

## 2021-12-21 NOTE — Progress Notes (Signed)
? ?[facility] ?[address] [place] ?[place], [address] ? ?Phone: [phone]  FAX: [phone] ? ?Albert Tran - 56 y.o. male  MRN [id number]  Date of Birth: [birth year] ? ?Date: 12/21/2021  PCP: Albert Pimple, MD  Referral: Albert Pimple, MD ? ?Chief Complaint  ?Patient presents with  ? Hand Pain  ?  Right Thumb ?  ? ? ?This visit occurred during the SARS-CoV-2 public health emergency.  Safety protocols were in place, including screening questions prior to the visit, additional usage of staff PPE, and extensive cleaning of exam room while observing appropriate contact time as indicated for disinfecting solutions.  ? ?Subjective:  ? ?Albert Tran is a 56 y.o. very pleasant male patient with Body mass index is 33.46 kg/m?. who presents with the following: ? ?He had good relief of symptoms and resolution of clicking and popping at the first digit on the left. ? ?He presents today with some reasonably global hand pain and having some difficulty making a composite fist on the right.  He also has quite a bit of pain at the flexor tendon surface, as well as a nodule at the first digit.  He is having difficulty notably squeezing this. ? ?He has relatively recent increased his hand initiating to the effort of multiple thousands of rounds each month. ? ?08/15/2021 Last OV with Albert Beat, MD  ?He presents with right-sided thumb pain. ? ?I saw him in December for some left-sided trigger thumb, and at that point he is also having some severe right-sided carpal tunnel syndrome.  At that point I referred him for nerve conduction velocity as well as EMG.  While I did place this for formal referral, this was not followed up upon, and no splint have. ? ?Studies have been undertaken. ? ?Addendum morning) hearing complaining of the headaches have known I am going ?History of there is almost I am going: S1 ? ?Lumbar surgical intervention including but ? ?Dad with Crohn's ? ?Rheum   ?Uric acid ? ?R trigger thumb ? ?Inj R thumb ?Rheum work-up ? ?Review of Systems is noted in the HPI, as appropriate ? ?Objective:  ? ?BP 120/80   Pulse (!) 55   Temp 98.4 ?F (36.9 ?C) (Oral)   Ht 5\' 5"  (1.651 m)   Wt 201 lb 1 oz (91.2 kg)   SpO2 97%   BMI 33.46 kg/m?  ? ?GEN: No acute distress; alert,appropriate. ?PULM: Breathing comfortably in no respiratory distress ?PSYCH: Normally interactive.  ? ?Bilateral hands with some bogginess and multiple joints at the DIP and PIP joints. ?Does also have some relatively diffuse Heberden's nodes and both hands bilaterally ?The basal joints are not particularly tender to deep palpation.  The wrist joint itself is not have an effusion, and is grossly unremarkable to palpation. ? ?He does have some limitation with making it fist on the right side ? ?He also does have a very palpable and painful nodule as well as pain and tenderness on the flexor tendon of the first digit on the right ? ?Laboratory and Imaging Data: ? ?Assessment and Plan:  ? ?  ICD-10-CM   ?1. Trigger thumb of right hand  M65.311   ?  ?2. Polyarthralgia  M25.50 ANA Screen,IFA,Reflex Titer/Pattern,Reflex Mplx 11 Ab Cascade with IdentRA  ?  Sedimentation rate  ?  Cyclic citrul peptide antibody, IgG  ?  Uric acid  ?  ?3. Effusion of joint of multiple sites  M25.40 ANA  Screen,IFA,Reflex Titer/Pattern,Reflex Mplx 11 Ab Cascade with IdentRA  ?  Sedimentation rate  ?  Cyclic citrul peptide antibody, IgG  ?  Uric acid  ?  ? ?Acute on chronic thumb pain as well as polyarthralgia with exacerbation.  This is the multiple times have seen the patient with some reasonably more global pain than would be expected with a basic trigger finger.  He does have an obvious nodule, and he also has obvious flexor tenosynovitis.  This likely should respond well to a trigger finger injection. ? ?Nevertheless, I think that his more global pain and swelling may be over and above what would be expected.  I am going to do a broad  rheumatological work-up and assessment. ? ?Tendon Sheath Injection Procedure Note ?Albert Tran ?May 11, 1966 ?Date of procedure: 12/21/2021 ? ?Procedure: Tendon Sheath Injection for Trigger Finger, R 1st ?Indications: Pain ? ?Procedure Details ?Verbal consent was obtained. Risks (including potential risk for skin lightening and potential atrophy), benefits and alternatives were discussed. Prepped with Chloraprep and Ethyl Chloride used for anesthesia. Under sterile conditions, patient injected at palmar crease aiming distally with 45 degree angle towards nodule; injected directly into tendon sheath. Medication flowed freely without resistance.  ?Needle size: 22 gauge 1 1/2 inch ?Injection: 1/2 cc of Lidocaine 1% and Kenalog 20 mg ?Medication: 1/2 cc of Kenalog 40 mg (equaling Kenalog 20 mg)  ? ?No orders of the defined types were placed in this encounter. ? ?There are no discontinued medications. ?Orders Placed This Encounter  ?Procedures  ? ANA Screen,IFA,Reflex Titer/Pattern,Reflex Mplx 11 Ab Cascade with IdentRA  ? Sedimentation rate  ? Cyclic citrul peptide antibody, IgG  ? Uric acid  ? ? ?Follow-up: No follow-ups on file. ? ?Dragon [employer] One speech-to-text software was used for transcription in this dictation.  Possible transcriptional errors can occur using Animal nutritionist.  ? ?Signed, ? ?Albert Tuzzolino T. Rhea Kaelin, MD ? ? ?Outpatient Encounter Medications as of 12/21/2021  ?Medication Sig  ? amLODipine (NORVASC) 5 MG tablet Take 5 mg by mouth daily as needed (bp over 150/90).  ? lisinopril-hydrochlorothiazide (ZESTORETIC) 20-25 MG tablet TAKE 1 TABLET BY MOUTH  DAILY  ? meloxicam (MOBIC) 15 MG tablet Take 1 tablet (15 mg total) by mouth daily as needed for pain. With food  ? Multiple Vitamin (MULTI-VITAMIN) tablet Take by mouth.  ? Omega-3 Fatty Acids (FISH OIL PO) Take 1 capsule by mouth daily.  ? ?No facility-administered encounter medications on file as of 12/21/2021.  ?  ?

## 2021-12-26 LAB — TIER 3
Centromere Ab Screen: <1 AI
Ribosomal P Protein Ab: <1 AI

## 2021-12-26 LAB — TIER 2
Jo-1 Autoabs: <1 AI
SSA (Ro) (ENA) Antibody, IgG: <1 AI
SSB (La) (ENA) Antibody, IgG: <1 AI
Scleroderma (Scl-70) (ENA) Antibody, IgG: <1 AI

## 2021-12-26 LAB — ANTI-NUCLEAR AB-TITER (ANA TITER): ANA Titer 1: 1:80 {titer} — ABNORMAL HIGH

## 2021-12-26 LAB — TIER 1
Chromatin (Nucleosomal) Antibody: <1 AI
ENA SM Ab Ser-aCnc: <1 AI
Ribonucleic Protein(ENA) Antibody, IgG: <1 AI
SM/RNP: <1 AI
ds DNA Ab: <1 IU/mL

## 2021-12-26 LAB — ANA SCREEN,IFA,REFLEX TITER/PATTERN,REFLEX MPLX 11 AB CASCADE
14-3-3 eta Protein: <0.2 ng/mL (ref <0.2)
Anti Nuclear Antibody (ANA): POSITIVE — AB
Cyclic Citrullin Peptide Ab: <16 UNITS
Rheumatoid fact SerPl-aCnc: <14 IU/mL (ref <14)

## 2021-12-26 LAB — INTERPRETATION

## 2022-01-19 ENCOUNTER — Other Ambulatory Visit: Payer: Self-pay | Admitting: Family Medicine

## 2022-01-23 NOTE — Telephone Encounter (Signed)
Please schedule annual exam (or if any new complaints a follow up)

## 2022-01-23 NOTE — Telephone Encounter (Signed)
Last f/u for med refill was 01/10/21, pt has had a few acute appts but no recent or future f/u or CPE appts scheduled

## 2022-02-01 NOTE — Telephone Encounter (Signed)
Letter mailed letting pt know appt due

## 2022-04-12 ENCOUNTER — Other Ambulatory Visit: Payer: Self-pay | Admitting: Family Medicine

## 2022-04-17 ENCOUNTER — Telehealth: Payer: Self-pay | Admitting: Family Medicine

## 2022-04-17 NOTE — Telephone Encounter (Signed)
Lower abdominal pain, right side  Transferred to access nurse

## 2022-04-17 NOTE — Telephone Encounter (Signed)
Patient has been scheduled with Tabitha 8/15 at 7:40.   [facility] Day - Client TELEPHONE ADVICE RECORD AccessNurse Patient Name: Albert Tran Gender: Male DOB: 01-08-1966 Age: 56 Y 1 M 23 D Return Phone Number: [phone] (Primary) Address: City/ State/ Zip: [place] [address] Client [facility] Day - Client Client Site [facility] - Day Provider Milinda Antis, Idamae Schuller - MD Contact Type Call Who Is Calling Patient / Member / Family / Caregiver Call Type Triage / Clinical Relationship To Patient Self Return Phone Number [phone] (Primary) Chief Complaint Flank Pain Reason for Call Symptomatic / Request for Health Information Initial Comment Caller states he has been experiencing right side abdominal pain. Translation No Nurse Assessment Nurse: Doylene Canard, RN, Lesa Date/Time (Eastern Time): 04/17/2022 8:45:41 AM Confirm and document reason for call. If symptomatic, describe symptoms. ---Caller states he has RLQ pain Does the patient have any new or worsening symptoms? ---Yes Will a triage be completed? ---Yes Related visit to physician within the last 2 weeks? ---No Does the PT have any chronic conditions? (i.e. diabetes, asthma, this includes High risk factors for pregnancy, etc.) ---No Is this a behavioral health or substance abuse call? ---No Guidelines Guideline Title Affirmed Question Affirmed Notes Nurse Date/Time (Eastern Time) Abdominal Pain - Male [1] MODERATE pain (e.g., interferes with normal activities) AND [2] pain comes and goes (cramps) AND [3] present > 24 hours (Exception: Pain with Vomiting or Diarrhea - see that Guideline.) Conner, RN, Lesa 04/17/2022 8:48:47 AM Disp. Time Albert Tran Time) Disposition Final User 04/17/2022 8:55:06 AM See PCP within 24 Hours Yes Conner, RN, Lesa PLEASE NOTE: All timestamps contained within this report are represented as [place] Standard  Time. CONFIDENTIALTY NOTICE: This fax transmission is intended only for the addressee. It contains information that is legally privileged, confidential or otherwise protected from use or disclosure. If you are not the intended recipient, you are strictly prohibited from reviewing, disclosing, copying using or disseminating any of this information or taking any action in reliance on or regarding this information. If you have received this fax in error, please notify us immediately by telephone so that we can arrange for its return to [place]. Phone: [phone], Toll-Free: [phone], Fax: [phone] Page: 2 of 2 Call Id: [id number] Final Disposition 04/17/2022 8:55:06 AM See PCP within 24 Hours Yes Conner, RN, Gean Maidens Caller Disagree/Comply Comply Caller Understands Yes PreDisposition Did not know what to do Care Advice Given Per Guideline SEE PCP WITHIN 24 HOURS: * IF OFFICE WILL BE OPEN: You need to be examined within the next 24 hours. Call your doctor (or NP/PA) when the office opens and make an appointment. * When you have cramps, drink some water, then lie down and try to find a comfortable position. DIET: * Drink adequate fluids. Eat a bland diet. * Avoid alcohol or caffeinated beverages. * Avoid greasy or fatty foods. CALL BACK IF: * Severe pain lasts over 1 hour * Constant pain lasts over 2 hours * You become worse CARE ADVICE given per Abdominal Pain - Male (Adult) guideline. Referrals REFERRED TO PCP OFFICE

## 2022-04-18 ENCOUNTER — Encounter: Payer: Self-pay | Admitting: Family

## 2022-04-18 ENCOUNTER — Ambulatory Visit: Payer: Commercial Managed Care - PPO | Admitting: Family

## 2022-04-18 ENCOUNTER — Ambulatory Visit
Admission: RE | Admit: 2022-04-18 | Discharge: 2022-04-18 | Disposition: A | Discharge status: Home / Self Care | Payer: Commercial Managed Care - PPO | Source: Ambulatory Visit | Attending: Family | Admitting: Family

## 2022-04-18 ENCOUNTER — Other Ambulatory Visit: Payer: Self-pay | Admitting: Family

## 2022-04-18 VITALS — BP 122/78 | HR 84 | Temp 98.3°F | Resp 16 | Ht 65.0 in | Wt 205.2 lb

## 2022-04-18 DIAGNOSIS — K429 Umbilical hernia without obstruction or gangrene: Secondary | ICD-10-CM | POA: Diagnosis present

## 2022-04-18 DIAGNOSIS — R1031 Right lower quadrant pain: Secondary | ICD-10-CM | POA: Insufficient documentation

## 2022-04-18 DIAGNOSIS — I700 Atherosclerosis of aorta: Secondary | ICD-10-CM

## 2022-04-18 DIAGNOSIS — K760 Fatty (change of) liver, not elsewhere classified: Secondary | ICD-10-CM

## 2022-04-18 DIAGNOSIS — R1011 Right upper quadrant pain: Secondary | ICD-10-CM | POA: Insufficient documentation

## 2022-04-18 DIAGNOSIS — K8020 Calculus of gallbladder without cholecystitis without obstruction: Secondary | ICD-10-CM

## 2022-04-18 HISTORY — DX: Atherosclerosis of aorta: I70.0

## 2022-04-18 LAB — CBC WITH DIFFERENTIAL/PLATELET
Basophils Absolute: 0.1 10*3/uL (ref 0.0–0.1)
Basophils Relative: 0.9 % (ref 0.0–3.0)
Eosinophils Absolute: 0.3 10*3/uL (ref 0.0–0.7)
Eosinophils Relative: 4 % (ref 0.0–5.0)
HCT: 42.6 % (ref 39.0–52.0)
Hemoglobin: 14.1 g/dL (ref 13.0–17.0)
Lymphocytes Relative: 29 % (ref 12.0–46.0)
Lymphs Abs: 1.8 10*3/uL (ref 0.7–4.0)
MCHC: 33.1 g/dL (ref 30.0–36.0)
MCV: 89.1 fl (ref 78.0–100.0)
Monocytes Absolute: 0.7 10*3/uL (ref 0.1–1.0)
Monocytes Relative: 10.6 % (ref 3.0–12.0)
Neutro Abs: 3.5 10*3/uL (ref 1.4–7.7)
Neutrophils Relative %: 55.5 % (ref 43.0–77.0)
Platelets: 265 10*3/uL (ref 150.0–400.0)
RBC: 4.78 Mil/uL (ref 4.22–5.81)
RDW: 13.1 % (ref 11.5–15.5)
WBC: 6.3 10*3/uL (ref 4.0–10.5)

## 2022-04-18 LAB — COMPREHENSIVE METABOLIC PANEL
ALT: 19 U/L (ref 0–53)
AST: 21 U/L (ref 0–37)
Albumin: 4.4 g/dL (ref 3.5–5.2)
Alkaline Phosphatase: 48 U/L (ref 39–117)
BUN: 16 mg/dL (ref 6–23)
CO2: 29 mEq/L (ref 19–32)
Calcium: 9.5 mg/dL (ref 8.4–10.5)
Chloride: 98 mEq/L (ref 96–112)
Creatinine, Ser: 0.91 mg/dL (ref 0.40–1.50)
GFR: 95.12 mL/min (ref >60.00)
Glucose, Bld: 99 mg/dL (ref 70–99)
Potassium: 4 mEq/L (ref 3.5–5.1)
Sodium: 139 mEq/L (ref 135–145)
Total Bilirubin: 0.9 mg/dL (ref 0.2–1.2)
Total Protein: 7.3 g/dL (ref 6.0–8.3)

## 2022-04-18 MED ORDER — IOHEXOL 300 MG/ML  SOLN
100.0000 mL | Freq: Once | INTRAMUSCULAR | Status: AC | PRN
  Administered 2022-04-18: 100 mL via INTRAVENOUS

## 2022-04-18 NOTE — Patient Instructions (Signed)
A referral was placed today for general surgeon.  Please let us know if you have not heard back within 2 weeks about the referral.  CT abd pelvis ordered for today.  Pending authorization.   Due to recent changes in healthcare laws, you may see results of your imaging and/or laboratory studies on MyChart before I have had a chance to review them.  I understand that in some cases there may be results that are confusing or concerning to you. Please understand that not all results are received at the same time and often I may need to interpret multiple results in order to provide you with the best plan of care or course of treatment. Therefore, I ask that you please give me 2 business days to thoroughly review all your results before contacting my office for clarification. Should we see a critical lab result, you will be contacted sooner.   It was a pleasure seeing you today! Please do not hesitate to reach out with any questions and or concerns.  Regards,   Mort Sawyers FNP-C

## 2022-04-18 NOTE — Progress Notes (Signed)
Established Patient Office Visit  Subjective:  Patient ID: Albert Tran, male    DOB: 02/10/1966  Age: 56 y.o. MRN: [id number]  CC:  Chief Complaint  Patient presents with   Abdominal Pain    Right X 5 days does not radiate     HPI Albert Tran is here today with concerns.   Abdominal pain right side for five days.  Thursday after getting out of work, going to sit down in his car he felt as though he felt a lower back muscle on his right lower back. He has done this before and it typically lasts for 2-3 days and ten improves. However, this time felt the pain was harder to determine where it was coming from. This did not seem similar to pain that he has experienced before when he has pulled his muscle.   He noticed he couldn't bend over to reach his sock, and or twist without pain on his right mid side.   He was told he had two hernias after surgery   Did read note from 01/14/20 with Dr. Everlene Farrier, general surgery . Was found to have left inguinal hernia and also umbilical hernia.   No nausea or vomiting.  Increased gas at times.  Bowel movements regular.   Past Medical History:  Diagnosis Date   Carpal tunnel syndrome    DDD (degenerative disc disease)    Family history of adverse reaction to anesthesia    Nephew had malignant hypothermia.   Fatty liver    Gallstones    Headache(784.0)    likely migraines   Hypertension    Hypertriglyceridemia    Nosebleed    Hx of   Open fracture of ankle 06/09/2010   Overview:  Overview:  Qualifier: Diagnosis of  By: Ernest Mallick LPN, Rena   Last Assessment & Plan:  Nl exam and many features of migraine  Waking up with them Ref to pulm to eval for sleep apnea -also loud snoring and fatigue  Then re eval Adv to take imitrex early in ha  Phenergan for nausea prn    Plantar fasciitis    Vertigo, intermittent     Past Surgical History:  Procedure Laterality Date   COLONOSCOPY WITH PROPOFOL N/A 07/13/2020   Procedure: COLONOSCOPY WITH  PROPOFOL;  Surgeon: Midge Minium, MD;  Location: [facility] ENDOSCOPY;  Service: Endoscopy;  Laterality: N/A;   HERNIA REPAIR     as a teen   KNEE ARTHROSCOPY     LAMINECTOMY     LS disc sx   robot assisted laprascopy with colectomy   01/23/2019   sigmoid colectomy from diverticulitis   TONSILLECTOMY      Family History  Problem Relation Age of Onset   Heart disease Mother        CAD   Alcohol abuse Father    Diabetes Father    Heart disease Father        CAD   Nephrolithiasis Father    Obesity Brother    Heart attack Brother 71   Cancer Paternal Uncle        bladder CA   Heart disease Sister 52       CAD with 4 stents    Social History   Socioeconomic History   Marital status: Married    Spouse name: Not on file   Number of children: Not on file   Years of education: Not on file   Highest education level: Not on file  Occupational History  Not on file  Tobacco Use   Smoking status: Former   Smokeless tobacco: Former    Types: Associate Professor Use: Never used  Substance and Sexual Activity   Alcohol use: No    Alcohol/week: 0.0 standard drinks of alcohol   Drug use: No   Sexual activity: Not on file  Other Topics Concern   Not on file  Social History Narrative   Not on file   Social Determinants of Health   Financial Resource Strain: Not on file  Food Insecurity: Not on file  Transportation Needs: Not on file  Physical Activity: Not on file  Stress: Not on file  Social Connections: Not on file  Intimate Partner Violence: Not on file    Outpatient Medications Prior to Visit  Medication Sig Dispense Refill   amLODipine (NORVASC) 5 MG tablet Take 5 mg by mouth daily as needed (bp over 150/90).     lisinopril-hydrochlorothiazide (ZESTORETIC) 20-25 MG tablet TAKE 1 TABLET BY MOUTH DAILY 90 tablet 0   meloxicam (MOBIC) 15 MG tablet Take 1 tablet (15 mg total) by mouth daily as needed for pain. With food 30 tablet 1   Multiple Vitamin (MULTI-VITAMIN)  tablet Take by mouth.     Omega-3 Fatty Acids (FISH OIL PO) Take 1 capsule by mouth daily.     No facility-administered medications prior to visit.    Allergies  Allergen Reactions   Pollen Extract         Objective:    Physical Exam Constitutional:      General: He is not in acute distress.    Appearance: Normal appearance. He is well-developed. He is obese. He is not ill-appearing, toxic-appearing or diaphoretic.  HENT:     Right Ear: Tympanic membrane normal.     Left Ear: Tympanic membrane normal.     Nose: Nose normal.  Eyes:     Pupils: Pupils are equal, round, and reactive to light.  Pulmonary:     Effort: Pulmonary effort is normal.  Abdominal:     General: Abdomen is flat. Bowel sounds are decreased.     Palpations: Abdomen is soft.     Tenderness: There is abdominal tenderness (right mid quadrant abdominal tenderness with rebound) in the right upper quadrant and right lower quadrant.     Hernia: A hernia is present. Hernia is present in the ventral area (ventral umbilical hernia, reducible but with some mild tenderness). There is no hernia in the umbilical area.  Musculoskeletal:        General: Normal range of motion.     Cervical back: Normal range of motion.  Skin:    General: Skin is warm.  Neurological:     General: No focal deficit present.     Mental Status: He is alert and oriented to person, place, and time.     Motor: No weakness.     Gait: Gait normal.  Psychiatric:        Mood and Affect: Mood normal.        Behavior: Behavior normal.        Thought Content: Thought content normal.        Judgment: Judgment normal.     BP 122/78   Pulse 84   Temp 98.3 F (36.8 C)   Resp 16   Ht 5\' 5"  (1.651 m)   Wt 205 lb 4 oz (93.1 kg)   SpO2 96%   BMI 34.16 kg/m  Wt Readings from Last  3 Encounters:  04/18/22 205 lb 4 oz (93.1 kg)  12/21/21 201 lb 1 oz (91.2 kg)  08/15/21 204 lb 6 oz (92.7 kg)     Health Maintenance Due  Topic Date Due    Hepatitis C Screening  Never done   Zoster Vaccines- Shingrix (1 of 2) Never done   TETANUS/TDAP  06/09/2020   COVID-19 Vaccine (3 - Pfizer series) 06/09/2020    There are no preventive care reminders to display for this patient.  Lab Results  Component Value Date   TSH 3.18 06/07/2018   Lab Results  Component Value Date   WBC 7.9 07/26/2020   HGB 13.7 07/26/2020   HCT 39.8 07/26/2020   MCV 87.9 07/26/2020   PLT 267.0 07/26/2020   Lab Results  Component Value Date   NA 141 01/13/2021   K 4.1 01/13/2021   CO2 29 01/13/2021   GLUCOSE 117 (H) 01/13/2021   BUN 13 01/13/2021   CREATININE 1.07 01/13/2021   BILITOT 0.6 01/13/2021   ALKPHOS 51 01/13/2021   AST 22 01/13/2021   ALT 29 01/13/2021   PROT 7.0 01/13/2021   ALBUMIN 4.2 01/13/2021   CALCIUM 9.3 01/13/2021   ANIONGAP 8 01/07/2020   GFR 79.02 01/13/2021   No results found for: "HGBA1C"    Assessment & Plan:   Problem List Items Addressed This Visit       Other   Right lower quadrant pain    Stat ct abd pelvis  Assess for diverticulitis, appendicitis and also assess for ventral/umbilical hernia  Labs today pending results.  Pt given red flag sx to assess for to go to Er with.       Umbilical hernia without obstruction and without gangrene - Primary    Stat ct abd pelvis  Assess for diverticulitis, appendicitis and also assess for ventral/umbilical hernia  Labs today pending results.  Pt given red flag sx to assess for to go to Er wit Referral to general surgeon , urgent      Relevant Orders   Ambulatory referral to General Surgery   CT Abdomen Pelvis W Contrast   Other Visit Diagnoses     Right lower quadrant abdominal pain       Relevant Orders   CT Abdomen Pelvis W Contrast   CBC with Differential   Comprehensive metabolic panel   Urine Culture   Right upper quadrant abdominal pain       Relevant Orders   CT Abdomen Pelvis W Contrast   CBC with Differential   Comprehensive metabolic panel    Urine Culture       No orders of the defined types were placed in this encounter.   Follow-up: Return if symptoms worsen or fail to improve with pcp.    Mort Sawyers, FNP

## 2022-04-18 NOTE — Assessment & Plan Note (Signed)
Stat ct abd pelvis  Assess for diverticulitis, appendicitis and also assess for ventral/umbilical hernia  Labs today pending results.  Pt given red flag sx to assess for to go to Er with.

## 2022-04-18 NOTE — Assessment & Plan Note (Signed)
Stat ct abd pelvis  Assess for diverticulitis, appendicitis and also assess for ventral/umbilical hernia  Labs today pending results.  Pt given red flag sx to assess for to go to Er wit Referral to general surgeon , urgent

## 2022-04-19 ENCOUNTER — Encounter: Payer: Self-pay | Admitting: Surgery

## 2022-04-19 ENCOUNTER — Ambulatory Visit: Payer: Commercial Managed Care - PPO | Admitting: Surgery

## 2022-04-19 ENCOUNTER — Other Ambulatory Visit: Payer: Self-pay

## 2022-04-19 VITALS — BP 162/116 | HR 68 | Temp 99.0°F | Ht 65.0 in | Wt 204.0 lb

## 2022-04-19 DIAGNOSIS — R1031 Right lower quadrant pain: Secondary | ICD-10-CM

## 2022-04-19 DIAGNOSIS — G8929 Other chronic pain: Secondary | ICD-10-CM | POA: Diagnosis not present

## 2022-04-19 NOTE — Patient Instructions (Addendum)
Please call with any questions or concerns.    Umbilical Hernia, Adult  A hernia is a bulge of tissue that pushes through an opening between muscles. An umbilical hernia happens in the abdomen, near the belly button (umbilicus). The hernia may contain tissues from the small intestine, large intestine, or fatty tissue covering the intestines. Umbilical hernias in adults tend to get worse over time, and they require surgical treatment. There are different types of umbilical hernias, including: Indirect hernia. This type is located just above or below the umbilicus. It is the most common type of umbilical hernia in adults. Direct hernia. This type forms through an opening formed by the umbilicus. Reducible hernia. This type of hernia comes and goes. It may be visible only when you strain, lift something heavy, or cough. This type of hernia can be pushed back into the abdomen (reduced). Incarcerated hernia. This type traps abdominal tissue inside the hernia. This type of hernia cannot be reduced. Strangulated hernia. This type of hernia cuts off blood flow to the tissues inside the hernia. The tissues can start to die if this happens. This type of hernia requires emergency treatment. What are the causes? An umbilical hernia happens when tissue inside the abdomen presses on a weak area of the abdominal muscles. What increases the risk? You may have a greater risk of this condition if you: Are obese. Have had several pregnancies. Have a buildup of fluid inside your abdomen. Have had surgery that weakens the abdominal muscles. What are the signs or symptoms? The main symptom of this condition is a painless bulge at or near the belly button. A reducible hernia may be visible only when you strain, lift something heavy, or cough. Other symptoms may include: Dull pain. A feeling of pressure. Symptoms of a strangulated hernia may include: Pain that gets increasingly worse. Nausea and vomiting. Pain  when pressing on the hernia. Skin over the hernia becoming red or purple. Constipation. Blood in the stool. How is this diagnosed? This condition may be diagnosed based on: A physical exam. You may be asked to cough or strain while standing. These actions increase the pressure inside your abdomen and can force the hernia through the opening in your muscles. Your health care provider may try to reduce the hernia by pressing on it. Your symptoms and medical history. How is this treated? Surgery is the only treatment for an umbilical hernia. Surgery for a strangulated hernia is done as soon as possible. If you have a small hernia that is not incarcerated, you may need to lose weight before having surgery. Follow these instructions at home: Lose weight, if told by your health care provider. Do not try to push the hernia back in. Watch your hernia for any changes in color or size. Tell your health care provider if any changes occur. You may need to avoid activities that increase pressure on your hernia. Do not lift anything that is heavier than 10 lb (4.5 kg), or the limit that you are told, until your health care provider says that it is safe. Take over-the-counter and prescription medicines only as told by your health care provider. Keep all follow-up visits. This is important. Contact a health care provider if: Your hernia gets larger. Your hernia becomes painful. Get help right away if: You develop sudden, severe pain near the area of your hernia. You have pain as well as nausea or vomiting. You have pain and the skin over your hernia changes color. You develop a  fever or chills. Summary A hernia is a bulge of tissue that pushes through an opening between muscles. An umbilical hernia happens near the belly button. Surgery is the only treatment for an umbilical hernia. Do not try to push your hernia back in. Keep all follow-up visits. This is important. This information is not intended  to replace advice given to you by your health care provider. Make sure you discuss any questions you have with your health care provider. Document Revised: 03/29/2020 Document Reviewed: 03/29/2020 Elsevier Patient Education  2023 [employer] Inguinal Hernia, Adult An inguinal hernia develops when fat or the intestines push through a weak spot in a muscle where the leg meets the lower abdomen (groin). This creates a bulge. This kind of hernia could also be: In the scrotum, if you are male. In folds of skin around the vagina, if you are male. There are three types of inguinal hernias: Hernias that can be pushed back into the abdomen (are reducible). This type rarely causes pain. Hernias that are not reducible (are incarcerated). Hernias that are not reducible and lose their blood supply (are strangulated). This type of hernia requires emergency surgery. What are the causes? This condition is caused by having a weak spot in the muscles or tissues in your groin. This develops over time. The hernia may poke through the weak spot when you suddenly strain your lower abdominal muscles, such as when you: Lift a heavy object. Strain to have a bowel movement. Constipation can lead to straining. Cough. What increases the risk? This condition is more likely to develop in: Males. Pregnant females. People who: Are overweight. Work in jobs that require long periods of standing or heavy lifting. Have had an inguinal hernia before. Smoke or have lung disease. These factors can lead to long-term (chronic) coughing. What are the signs or symptoms? Symptoms may depend on the size of the hernia. Often, a small inguinal hernia has no symptoms. Symptoms of a larger hernia may include: A bulge in the groin area. This is easier to see when standing. It might not be visible when lying down. Pain or burning in the groin. This may get worse when lifting, straining, or coughing. A dull ache or a feeling of  pressure in the groin. An unusual bulge in the scrotum, in males. Symptoms of a strangulated inguinal hernia may include: A bulge in your groin that is very painful and tender to the touch. A bulge that turns red or purple. Fever, nausea, and vomiting. Inability to have a bowel movement or to pass gas. How is this diagnosed? This condition is diagnosed based on your symptoms, your medical history, and a physical exam. Your health care provider may feel your groin area and ask you to cough. How is this treated? Treatment depends on the size of your hernia and whether you have symptoms. If you do not have symptoms, your health care provider may have you watch your hernia carefully and have you come in for follow-up visits. If your hernia is large or if you have symptoms, you may need surgery to repair the hernia. Follow these instructions at home: Lifestyle Avoid lifting heavy objects. Avoid standing for long periods of time. Do not use any products that contain nicotine or tobacco. These products include cigarettes, chewing tobacco, and vaping devices, such as e-cigarettes. If you need help quitting, ask your health care provider. Maintain a healthy weight. Preventing constipation You may need to take these actions to prevent or treat constipation:  Drink enough fluid to keep your urine pale yellow. Take over-the-counter or prescription medicines. Eat foods that are high in fiber, such as beans, whole grains, and fresh fruits and vegetables. Limit foods that are high in fat and processed sugars, such as fried or sweet foods. General instructions You may try to push the hernia back in place by very gently pressing on it while lying down. Do not try to force the bulge back in if it will not push in easily. Watch your hernia for any changes in shape, size, or color. Get help right away if you notice any changes. Take over-the-counter and prescription medicines only as told by your health care  provider. Keep all follow-up visits. This is important. Contact a health care provider if: You have a fever or chills. You develop new symptoms. Your symptoms get worse. Get help right away if: You have pain in your groin that suddenly gets worse. You have a bulge in your groin that: Suddenly gets bigger and does not get smaller. Becomes red or purple or painful to the touch. You are a man and you have a sudden pain in your scrotum, or the size of your scrotum suddenly changes. You cannot push the hernia back in place by very gently pressing on it when you are lying down. You have nausea or vomiting that does not go away. You have a fast heartbeat. You cannot have a bowel movement or pass gas. These symptoms may represent a serious problem that is an emergency. Do not wait to see if the symptoms will go away. Get medical help right away. Call your local emergency services (911 in the [place] Summary An inguinal hernia develops when fat or the intestines push through a weak spot in a muscle where your leg meets your lower abdomen (groin). This condition is caused by having a weak spot in muscles or tissues in your groin. Symptoms may depend on the size of the hernia, and they may include pain or swelling in your groin. A small inguinal hernia often has no symptoms. Treatment may not be needed if you do not have symptoms. If you have symptoms or a large hernia, you may need surgery to repair the hernia. Avoid lifting heavy objects. Also, avoid standing for long periods of time. This information is not intended to replace advice given to you by your health care provider. Make sure you discuss any questions you have with your health care provider. Document Revised: 04/20/2020 Document Reviewed: 04/20/2020 Elsevier Patient Education  2023 [employer].

## 2022-04-20 LAB — URINE CULTURE
MICRO NUMBER:: 13781698
Result:: NO GROWTH
SPECIMEN QUALITY:: ADEQUATE

## 2022-04-20 NOTE — Progress Notes (Signed)
Outpatient Surgical Follow Up  04/20/2022  Albert Tran is an 56 y.o. male.   Chief Complaint  Patient presents with   Follow-up    Umbilical/ventral hernia    HPI: Albert Tran is a 56 year old male well-known to me with prior history of chronic diverticulitis.  I did perform a robotic sigmoid colectomy on May 2021.  He did well from this without complications.  He endorses for the last few weeks new onset of sharp right-sided abdominal wall pain.  Seems to be positional and getting worse with certain movements.  The pain was moderate intermittent.  Today seems to be better.  He reports that he has this chronic abdominal pain on the right side that did not improve after his sigmoid colectomy.  He has had extensive work-up including another colonoscopy after the colectomy on November 2021 showing no active colonic pathology with a widely patent anastomosis.  Is noted to have personal review the endoscopic images.  He also had a most recent CT scan that I have personally reviewed showing evidence of a small umbilical hernia.  I also review my operative reports and I did find a small left inguinal hernia as well.  Please note that those are not necessarily the cause of his symptoms and I do consider them to be asymptomatic at this time.  Send lab work to include a CBC and CMP was completely normal. He is very active and is the caregiver of his wife who has multiple sclerosis  Also noticed some weight gain over the last 2 years and persistent pencillike stool   Past Medical History:  Diagnosis Date   Carpal tunnel syndrome    DDD (degenerative disc disease)    Family history of adverse reaction to anesthesia    Nephew had malignant hypothermia.   Fatty liver    Gallstones    Headache(784.0)    likely migraines   Hypertension    Hypertriglyceridemia    Nosebleed    Hx of   Open fracture of ankle 06/09/2010   Overview:  Overview:  Qualifier: Diagnosis of  By: Ernest Mallick LPN, Rena   Last  Assessment & Plan:  Nl exam and many features of migraine  Waking up with them Ref to pulm to eval for sleep apnea -also loud snoring and fatigue  Then re eval Adv to take imitrex early in ha  Phenergan for nausea prn    Plantar fasciitis    Vertigo, intermittent     Past Surgical History:  Procedure Laterality Date   COLONOSCOPY WITH PROPOFOL N/A 07/13/2020   Procedure: COLONOSCOPY WITH PROPOFOL;  Surgeon: Midge Minium, MD;  Location: [facility] ENDOSCOPY;  Service: Endoscopy;  Laterality: N/A;   HERNIA REPAIR     as a teen   KNEE ARTHROSCOPY     LAMINECTOMY     LS disc sx   robot assisted laprascopy with colectomy   01/23/2019   sigmoid colectomy from diverticulitis   TONSILLECTOMY      Family History  Problem Relation Age of Onset   Heart disease Mother        CAD   Alcohol abuse Father    Diabetes Father    Heart disease Father        CAD   Nephrolithiasis Father    Obesity Brother    Heart attack Brother 67   Cancer Paternal Uncle        bladder CA   Heart disease Sister 32       CAD with 4  stents    Social History:  reports that he has quit smoking. He has quit using smokeless tobacco.  His smokeless tobacco use included chew. He reports that he does not drink alcohol and does not use drugs.  Allergies:  Allergies  Allergen Reactions   Pollen Extract     Medications reviewed.    ROS Full ROS performed and is otherwise negative other than what is stated in HPI   BP (!) 162/116   Pulse 68   Temp 99 F (37.2 C) (Oral)   Ht 5\' 5"  (1.651 m)   Wt 204 lb (92.5 kg)   SpO2 95%   BMI 33.95 kg/m   Physical Exam Vitals and nursing note reviewed. Exam conducted with a chaperone present.  Constitutional:      General: He is not in acute distress.    Appearance: Normal appearance. He is not ill-appearing.  Eyes:     General: No scleral icterus.       Right eye: No discharge.        Left eye: No discharge.  Cardiovascular:     Rate and Rhythm: Normal rate and  regular rhythm.     Heart sounds: No murmur heard.    No friction rub.  Abdominal:     General: Abdomen is flat. There is no distension.     Palpations: Abdomen is soft. There is no mass.     Tenderness: There is abdominal tenderness. There is no guarding or rebound.     Hernia: A hernia is present.     Comments: Small 3 cm reducible umbilical hernia.  This is not tender.  He does have a small left inguinal hernia that is reducible and nontender. I robotic incisions healing well without evidence of any complications no infection or incisional hernias. He has minimal tenderness to palpation in the right side.  No peritonitis  Musculoskeletal:        General: No swelling or tenderness. Normal range of motion.     Cervical back: Normal range of motion and neck supple. No rigidity or tenderness.  Lymphadenopathy:     Cervical: No cervical adenopathy.  Skin:    General: Skin is warm and dry.     Capillary Refill: Capillary refill takes less than 2 seconds.  Neurological:     General: No focal deficit present.     Mental Status: He is alert and oriented to person, place, and time.  Psychiatric:        Mood and Affect: Mood normal.        Behavior: Behavior normal.        Thought Content: Thought content normal.        Judgment: Judgment normal.        No results found for this or any previous visit (from the past 48 hour(s)). CT Abdomen Pelvis W Contrast  Result Date: 04/18/2022 CLINICAL DATA:  Right-sided abdominal pain for the past 5 days. EXAM: CT ABDOMEN AND PELVIS WITH CONTRAST TECHNIQUE: Multidetector CT imaging of the abdomen and pelvis was performed using the standard protocol following bolus administration of intravenous contrast. RADIATION DOSE REDUCTION: This exam was performed according to the departmental dose-optimization program which includes automated exposure control, adjustment of the mA and/or kV according to patient size and/or use of iterative reconstruction  technique. CONTRAST:  04/20/2022 OMNIPAQUE IOHEXOL 300 MG/ML  SOLN COMPARISON:  CT abdomen pelvis dated December 02, 2019. FINDINGS: Lower chest: No acute abnormality. Hepatobiliary: Unchanged diffusely decreased liver density. No focal  liver abnormality. Unchanged 1.4 cm gallstone. No gallbladder wall thickening or biliary dilatation. Pancreas: Unremarkable. No pancreatic ductal dilatation or surrounding inflammatory changes. Spleen: Normal in size without focal abnormality. Adrenals/Urinary Tract: Adrenal glands are unremarkable. Unchanged punctate left renal calculus. No renal mass or hydronephrosis. The bladder is unremarkable. Stomach/Bowel: Stomach is within normal limits. Appendix appears normal. No evidence of bowel wall thickening, distention, or inflammatory changes. Partial sigmoid colon resection. Mild left-sided colonic diverticulosis. Vascular/Lymphatic: Aortic atherosclerosis. No enlarged abdominal or pelvic lymph nodes. Reproductive: Prostate is unremarkable. Other: Unchanged tiny fat containing umbilical hernia. No free fluid or pneumoperitoneum. Musculoskeletal: No acute or significant osseous findings. Chronic bilateral L5 pars defects again noted. No significant listhesis. IMPRESSION: 1. No acute intra-abdominal process. 2. Unchanged hepatic steatosis, cholelithiasis, and punctate left nephrolithiasis. 3. Aortic Atherosclerosis (ICD10-I70.0). Electronically Signed   By: Obie Dredge M.D.   On: 04/18/2022 12:26    Assessment/Plan: Right-sided abdominal pain of unclear etiology.  Seems to be more musculoskeletal.  I had an extensive discussion with him and actually reviewed the images with him.  I provided extensive counseling.  At this time there is no anatomical issues that would explain his pain.  He is hernias both umbilical and inguinal are not the source of the symptoms.  I will hate to repair those and then have persistent symptoms.  At this time I am not recommending any repair of the  inguinal or umbilical hernias at this time. Please note I spent 40 minutes in this encounter including personally reviewing imaging studies, reviewing medical records, coordinating hid care, counseling the pt, placing orders and performing appropriate documentation.  Sterling Big, MD [facility] General Surgeon

## 2022-06-07 NOTE — Progress Notes (Signed)
Patent did not view their my chart test result that I responded to one week ago.  Please call them and advise them of the below results.

## 2022-06-08 NOTE — Progress Notes (Signed)
Saw pt, with aortic artherosclerosis  He is willing to discuss lipitor, I guess it looks like he will video visit with you about this tomorrow. Just a heads up!

## 2022-06-09 ENCOUNTER — Encounter: Payer: Self-pay | Admitting: Family Medicine

## 2022-06-09 ENCOUNTER — Telehealth (INDEPENDENT_AMBULATORY_CARE_PROVIDER_SITE_OTHER): Payer: Commercial Managed Care - PPO | Admitting: Family Medicine

## 2022-06-09 DIAGNOSIS — I700 Atherosclerosis of aorta: Secondary | ICD-10-CM

## 2022-06-09 DIAGNOSIS — K760 Fatty (change of) liver, not elsewhere classified: Secondary | ICD-10-CM | POA: Diagnosis not present

## 2022-06-09 DIAGNOSIS — E7800 Pure hypercholesterolemia, unspecified: Secondary | ICD-10-CM

## 2022-06-09 MED ORDER — ROSUVASTATIN CALCIUM 10 MG PO TABS: 10.0000 mg | ORAL_TABLET | Freq: Every day | ORAL | 3 refills | Status: DC

## 2022-06-09 NOTE — Assessment & Plan Note (Signed)
Last LFTs nl  Discussed dietary change and need for wt loss  Will work on this Planning to try crestor for cholesterol-will monitor

## 2022-06-09 NOTE — Assessment & Plan Note (Signed)
Noted on CT 04/2022 No symptoms Has hyperlipidemia and HTN Plan to start trial of crestor and report back   May consider coronary ca score later

## 2022-06-09 NOTE — Assessment & Plan Note (Signed)
With past high triglycerides (now less elevated) and LDL of 130 In setting of aortic atherosclerosis on recent CT Disc opt for tx Disc goals for lipids and reasons to control them Rev last labs with pt Rev low sat fat diet in detail Disc trial of crestor 10 mg daily (rev poss side eff)  Continues fish oil  Working on wt loss Aware of fatty liver/last LFTs nl  inst to call if side eff Re check lipid in 6 wk   Plan to discuss risk factors more at the next visit  Cardiac ca score/CT is an option

## 2022-06-09 NOTE — Patient Instructions (Signed)
Try the crestor one daily in the evening If any side effects like muscle pain-hold it and let us know  The office will call to schedule fasting labs in 6 weeks   Try and eat well  Avoid red meat/ fried foods/ egg yolks/ fatty breakfast meats/ butter, cheese and high fat dairy/ and shellfish   Try and exercise regularly    Let's talk about a cardiac calcium score CT next time you are here for screening

## 2022-06-09 NOTE — Progress Notes (Signed)
Virtual Visit via Telephone Note  I connected with Albert Tran on 06/09/22 at 12:00 PM EDT by telephone and verified that I am speaking with the correct person using two identifiers.  Location: Patient: workplace  Provider: office    I discussed the limitations, risks, security and privacy concerns of performing an evaluation and management service by telephone and the availability of in person appointments. I also discussed with the patient that there may be a patient responsible charge related to this service. The patient expressed understanding and agreed to proceed.  Parties involved in encounter  Patient: Albert Tran  Provider:  Roxy Manns MD   History of Present Illness: Pt presents to discuss starting cholesterol medicine   Feeling pretty normal  Still having some GI complaints    Had CT scan in august Fatty liver One gallstone   Aortic atherosclerosis   CT Abdomen Pelvis W Contrast (Accession [id number]) (Order [id number]) Imaging Date: 04/18/2022 Department: [facility] CT IMAGING Released By: Nash Shearer Authorizing: Mort Sawyers, FNP   Exam Status  Status  Final [99]   PACS Intelerad Image Link   Show images for CT Abdomen Pelvis W Contrast  Study Result  Narrative & Impression  CLINICAL DATA:  Right-sided abdominal pain for the past 5 days.   EXAM: CT ABDOMEN AND PELVIS WITH CONTRAST   TECHNIQUE: Multidetector CT imaging of the abdomen and pelvis was performed using the standard protocol following bolus administration of intravenous contrast.   RADIATION DOSE REDUCTION: This exam was performed according to the departmental dose-optimization program which includes automated exposure control, adjustment of the mA and/or kV according to patient size and/or use of iterative reconstruction technique.   CONTRAST:  OMNIPAQUE IOHEXOL 300 MG/ML  SOLN   COMPARISON:  CT abdomen pelvis dated December 02, 2019.    FINDINGS: Lower chest: No acute abnormality.   Hepatobiliary: Unchanged diffusely decreased liver density. No focal liver abnormality. Unchanged 1.4 cm gallstone. No gallbladder wall thickening or biliary dilatation.   Pancreas: Unremarkable. No pancreatic ductal dilatation or surrounding inflammatory changes.   Spleen: Normal in size without focal abnormality.   Adrenals/Urinary Tract: Adrenal glands are unremarkable. Unchanged punctate left renal calculus. No renal mass or hydronephrosis. The bladder is unremarkable.   Stomach/Bowel: Stomach is within normal limits. Appendix appears normal. No evidence of bowel wall thickening, distention, or inflammatory changes. Partial sigmoid colon resection. Mild left-sided colonic diverticulosis.   Vascular/Lymphatic: Aortic atherosclerosis. No enlarged abdominal or pelvic lymph nodes.   Reproductive: Prostate is unremarkable.   Other: Unchanged tiny fat containing umbilical hernia. No free fluid or pneumoperitoneum.   Musculoskeletal: No acute or significant osseous findings. Chronic bilateral L5 pars defects again noted. No significant listhesis.   IMPRESSION: 1. No acute intra-abdominal process. 2. Unchanged hepatic steatosis, cholelithiasis, and punctate left nephrolithiasis. 3. Aortic Atherosclerosis (ICD10-I70.0).       Lab Results  Component Value Date   CHOL 206 (H) 01/13/2021   CHOL 265 (H) 05/08/2017   CHOL 212 (H) 12/24/2015   Lab Results  Component Value Date   HDL 41.40 01/13/2021   HDL 37.70 (L) 05/08/2017   HDL 37.80 (L) 12/24/2015   Lab Results  Component Value Date   LDLCALC 130 (H) 01/13/2021   Lab Results  Component Value Date   TRIG 172.0 (H) 01/13/2021   TRIG (H) 05/08/2017    1080.0 Triglyceride is over 400; calculations on Lipids are invalid.   TRIG (H) 12/24/2015    423.0 Triglyceride  is over 400; calculations on Lipids are invalid.   Lab Results  Component Value Date   CHOLHDL 5  01/13/2021   CHOLHDL 7 05/08/2017   CHOLHDL 6 12/24/2015   Lab Results  Component Value Date   LDLDIRECT 128.0 05/08/2017   LDLDIRECT 103.0 12/24/2015   LDLDIRECT 135.0 09/14/2015   Mother had CAD/vasc issues  Father had CAD  Diet -he does have some beef in moderation  Also snack foods/fried foods  Could improve    The 10-year ASCVD risk score (Arnett DK, et al., 2019) is: 11.9%   Values used to calculate the score:     Age: 44 years     Sex: Male     Is Non-Hispanic African American: No     Diabetic: No     Tobacco smoker: No     Systolic Blood Pressure: 162 mmHg     Is BP treated: Yes     HDL Cholesterol: 41.4 mg/dL     Total Cholesterol: 206 mg/dL   He started some push ups and sit ups in the past month  Using weights     last LFT :  Lab Results  Component Value Date   ALT 19 04/18/2022   AST 21 04/18/2022   ALKPHOS 48 04/18/2022   BILITOT 0.9 04/18/2022   Patient Active Problem List   Diagnosis Date Noted   Umbilical hernia without obstruction and without gangrene 04/18/2022   Aortic atherosclerosis (HCC) 04/18/2022   Calculus of gallbladder without cholecystitis without obstruction 04/18/2022   Trigger thumb 08/08/2021   Right lower quadrant pain 07/26/2020   S/P laparoscopic-assisted sigmoidectomy 01/06/2020   Dyslipidemia 09/13/2016   Family history of early CAD 09/12/2016   Disorder of patella 11/15/2015   Essential hypertension 08/09/2015   Obesity 08/09/2015   Snoring 03/22/2011   Fatty liver 06/29/2010   Cholelithiasis 06/14/2010   Hyperlipidemia 06/09/2010   HYPERBILIRUBINEMIA 06/09/2010   Headache 06/09/2010   TRANSAMINASES, SERUM, ELEVATED 06/09/2010   Plantar fasciitis, left 04/05/2010   CARPAL TUNNEL SYNDROME 10/11/2007   VERTIGO 09/10/2007   Past Medical History:  Diagnosis Date   Carpal tunnel syndrome    DDD (degenerative disc disease)    Family history of adverse reaction to anesthesia    Nephew had malignant hypothermia.    Fatty liver    Gallstones    Headache(784.0)    likely migraines   Hypertension    Hypertriglyceridemia    Nosebleed    Hx of   Open fracture of ankle 06/09/2010   Overview:  Overview:  Qualifier: Diagnosis of  By: Ernest Mallick LPN, Rena   Last Assessment & Plan:  Nl exam and many features of migraine  Waking up with them Ref to pulm to eval for sleep apnea -also loud snoring and fatigue  Then re eval Adv to take imitrex early in ha  Phenergan for nausea prn    Plantar fasciitis    Vertigo, intermittent    Past Surgical History:  Procedure Laterality Date   COLONOSCOPY WITH PROPOFOL N/A 07/13/2020   Procedure: COLONOSCOPY WITH PROPOFOL;  Surgeon: Midge Minium, MD;  Location: [facility] ENDOSCOPY;  Service: Endoscopy;  Laterality: N/A;   HERNIA REPAIR     as a teen   KNEE ARTHROSCOPY     LAMINECTOMY     LS disc sx   robot assisted laprascopy with colectomy   01/23/2019   sigmoid colectomy from diverticulitis   TONSILLECTOMY     Social History   Tobacco Use   Smoking  status: Former   Smokeless tobacco: Former    Types: Nurse, children's Use: Never used  Substance Use Topics   Alcohol use: No    Alcohol/week: 0.0 standard drinks of alcohol   Drug use: No   Family History  Problem Relation Age of Onset   Heart disease Mother        CAD   Alcohol abuse Father    Diabetes Father    Heart disease Father        CAD   Nephrolithiasis Father    Obesity Brother    Heart attack Brother 58   Cancer Paternal Uncle        bladder CA   Heart disease Sister 48       CAD with 4 stents   Allergies  Allergen Reactions   Pollen Extract    Current Outpatient Medications on File Prior to Visit  Medication Sig Dispense Refill   amLODipine (NORVASC) 5 MG tablet Take 5 mg by mouth daily as needed (bp over 150/90).     lisinopril-hydrochlorothiazide (ZESTORETIC) 20-25 MG tablet TAKE 1 TABLET BY MOUTH DAILY 90 tablet 0   Multiple Vitamin (MULTI-VITAMIN) tablet Take by mouth.     Omega-3  Fatty Acids (FISH OIL PO) Take 1 capsule by mouth daily.     No current facility-administered medications on file prior to visit.   Review of Systems  Constitutional:  Negative for chills, fever and malaise/fatigue.  HENT:  Negative for congestion, ear pain, sinus pain and sore throat.   Eyes:  Negative for blurred vision, discharge and redness.  Respiratory:  Negative for cough, shortness of breath and stridor.   Cardiovascular:  Negative for chest pain, palpitations and leg swelling.  Gastrointestinal:  Positive for abdominal pain. Negative for diarrhea, nausea and vomiting.       Occ abd pain   Musculoskeletal:  Negative for myalgias.  Skin:  Negative for rash.  Neurological:  Negative for dizziness and headaches.    Observations/Objective: Pt sounds well  Not in distress No voice changes  Nl mood /pleasant  Nl cognition/good historian   Assessment and Plan: Problem List Items Addressed This Visit       Cardiovascular and Mediastinum   Aortic atherosclerosis ([place])    Noted on CT 04/2022 No symptoms Has hyperlipidemia and HTN Plan to start trial of crestor and report back   May consider coronary ca score later      Relevant Medications   rosuvastatin (CRESTOR) 10 MG tablet     Digestive   Fatty liver    Last LFTs nl  Discussed dietary change and need for wt loss  Will work on this Planning to try crestor for cholesterol-will monitor       Relevant Orders   ALT   AST     Other   Hyperlipidemia - Primary    With past high triglycerides (now less elevated) and LDL of 130 In setting of aortic atherosclerosis on recent CT Disc opt for tx Disc goals for lipids and reasons to control them Rev last labs with pt Rev low sat fat diet in detail Disc trial of crestor 10 mg daily (rev poss side eff)  Continues fish oil  Working on wt loss Aware of fatty liver/last LFTs nl  inst to call if side eff Re check lipid in 6 wk   Plan to discuss risk factors more at the  next visit  Cardiac ca score/CT is an option  Relevant Medications   rosuvastatin (CRESTOR) 10 MG tablet   Other Relevant Orders   ALT   AST   Lipid panel     Follow Up Instructions: Try the crestor one daily in the evening If any side effects like muscle pain-hold it and let us know  The office will call to schedule fasting labs in 6 weeks   Try and eat well  Avoid red meat/ fried foods/ egg yolks/ fatty breakfast meats/ butter, cheese and high fat dairy/ and shellfish   Try and exercise regularly    Let's talk about a cardiac calcium score CT next time you are here for screening    I discussed the assessment and treatment plan with the patient. The patient was provided an opportunity to ask questions and all were answered. The patient agreed with the plan and demonstrated an understanding of the instructions.   The patient was advised to call back or seek an in-person evaluation if the symptoms worsen or if the condition fails to improve as anticipated.  I provided 18 minutes of non-face-to-face time during this encounter.   Roxy Manns, MD

## 2022-07-04 ENCOUNTER — Other Ambulatory Visit: Payer: Self-pay | Admitting: Family Medicine

## 2022-08-14 ENCOUNTER — Encounter: Payer: Self-pay | Admitting: Family Medicine

## 2022-08-14 ENCOUNTER — Ambulatory Visit: Payer: Commercial Managed Care - PPO | Admitting: Family Medicine

## 2022-08-14 VITALS — BP 158/92 | HR 70 | Temp 97.5°F | Ht 65.0 in | Wt 201.5 lb

## 2022-08-14 DIAGNOSIS — R1031 Right lower quadrant pain: Secondary | ICD-10-CM

## 2022-08-14 LAB — CBC WITH DIFFERENTIAL/PLATELET
Basophils Absolute: 0.1 10*3/uL (ref 0.0–0.1)
Basophils Relative: 0.6 % (ref 0.0–3.0)
Eosinophils Absolute: 0.3 10*3/uL (ref 0.0–0.7)
Eosinophils Relative: 2.8 % (ref 0.0–5.0)
HCT: 40.4 % (ref 39.0–52.0)
Hemoglobin: 14.1 g/dL (ref 13.0–17.0)
Lymphocytes Relative: 18.4 % (ref 12.0–46.0)
Lymphs Abs: 1.9 10*3/uL (ref 0.7–4.0)
MCHC: 34.8 g/dL (ref 30.0–36.0)
MCV: 87.5 fl (ref 78.0–100.0)
Monocytes Absolute: 1 10*3/uL (ref 0.1–1.0)
Monocytes Relative: 9.8 % (ref 3.0–12.0)
Neutro Abs: 7 10*3/uL (ref 1.4–7.7)
Neutrophils Relative %: 68.4 % (ref 43.0–77.0)
Platelets: 259 10*3/uL (ref 150.0–400.0)
RBC: 4.62 Mil/uL (ref 4.22–5.81)
RDW: 12.6 % (ref 11.5–15.5)
WBC: 10.3 10*3/uL (ref 4.0–10.5)

## 2022-08-14 LAB — POC URINALSYSI DIPSTICK (AUTOMATED)
Bilirubin, UA: NEGATIVE
Blood, UA: NEGATIVE
Glucose, UA: NEGATIVE
Ketones, UA: NEGATIVE
Leukocytes, UA: NEGATIVE
Nitrite, UA: NEGATIVE
Protein, UA: NEGATIVE
Spec Grav, UA: 1.015 (ref 1.010–1.025)
Urobilinogen, UA: 0.2 E.U./dL
pH, UA: 7.5 (ref 5.0–8.0)

## 2022-08-14 LAB — COMPREHENSIVE METABOLIC PANEL
ALT: 24 U/L (ref 0–53)
AST: 21 U/L (ref 0–37)
Albumin: 4.5 g/dL (ref 3.5–5.2)
Alkaline Phosphatase: 58 U/L (ref 39–117)
BUN: 13 mg/dL (ref 6–23)
CO2: 29 mEq/L (ref 19–32)
Calcium: 9.5 mg/dL (ref 8.4–10.5)
Chloride: 102 mEq/L (ref 96–112)
Creatinine, Ser: 0.73 mg/dL (ref 0.40–1.50)
GFR: 102.45 mL/min (ref >60.00)
Glucose, Bld: 116 mg/dL — ABNORMAL HIGH (ref 70–99)
Potassium: 3.5 mEq/L (ref 3.5–5.1)
Sodium: 137 mEq/L (ref 135–145)
Total Bilirubin: 0.9 mg/dL (ref 0.2–1.2)
Total Protein: 7.4 g/dL (ref 6.0–8.3)

## 2022-08-14 MED ORDER — CYCLOBENZAPRINE HCL 10 MG PO TABS: 5.0000 mg | ORAL_TABLET | Freq: Three times a day (TID) | ORAL | 0 refills | Status: DC | PRN

## 2022-08-14 NOTE — Assessment & Plan Note (Signed)
Acute on chronic ever since his sigmoid colectomy (for diverticulitis) in 2021  Was seen in aug by NP Dugal as well with reassuring CT and labs  Reviewed last gen surg note from aug as well as notes from [employer] and last labs and imaging in detail   Pain is episodic and colicky (also somewhat positional)  Reassuring exam today without much tenderness/ pain does get worse with standing  Ua neg Stat cbc and cmet pending Px flexeril to see if this may be msk in nature  ER precautions noted

## 2022-08-14 NOTE — Progress Notes (Signed)
Subjective:    Patient ID: Albert Tran, male    DOB: 05-09-1966, 57 y.o.   MRN: [id number]  HPI Pt presents for low abd pain   Wt Readings from Last 3 Encounters:  08/14/22 201 lb 8 oz (91.4 kg)  04/19/22 204 lb (92.5 kg)  04/18/22 205 lb 4 oz (93.1 kg)   33.53 kg/m  Same R sided pain as it has been  On and off  Woke up this am with more severe pain  Sharp pain / squeezing pain  Colicky  This is 2nd time  Worse with movement / change in position   Does still have an appendix  Still has gallstones   Back is not hurting   No blood in urine or stool   No n/v No fever   Eating has no effect    Prior h/o chronic diverticulitis with  sigmoid colectomy in 2021 Per last note with his surgeon his chronic R sided pain did not improve after that  He had ext w/u including another colonoscopy which was reassuring  CT scan noted small umb hernia and small L inguinal hernia   ? MSK pain   Last CT in aug 2023 IMPRESSION: 1. No acute intra-abdominal process. 2. Unchanged hepatic steatosis, cholelithiasis, and punctate left nephrolithiasis. 3. Aortic Atherosclerosis (ICD10-I70.0).   Patient Active Problem List   Diagnosis Date Noted   Umbilical hernia without obstruction and without gangrene 04/18/2022   Aortic atherosclerosis (HCC) 04/18/2022   Calculus of gallbladder without cholecystitis without obstruction 04/18/2022   Trigger thumb 08/08/2021   Right lower quadrant pain 07/26/2020   S/P laparoscopic-assisted sigmoidectomy 01/06/2020   Dyslipidemia 09/13/2016   Family history of early CAD 09/12/2016   Disorder of patella 11/15/2015   Essential hypertension 08/09/2015   Obesity 08/09/2015   Snoring 03/22/2011   Fatty liver 06/29/2010   Cholelithiasis 06/14/2010   Hyperlipidemia 06/09/2010   HYPERBILIRUBINEMIA 06/09/2010   Headache 06/09/2010   TRANSAMINASES, SERUM, ELEVATED 06/09/2010   Plantar fasciitis, left 04/05/2010   CARPAL TUNNEL SYNDROME  10/11/2007   VERTIGO 09/10/2007   Past Medical History:  Diagnosis Date   Carpal tunnel syndrome    DDD (degenerative disc disease)    Family history of adverse reaction to anesthesia    Nephew had malignant hypothermia.   Fatty liver    Gallstones    Headache(784.0)    likely migraines   Hypertension    Hypertriglyceridemia    Nosebleed    Hx of   Open fracture of ankle 06/09/2010   Overview:  Overview:  Qualifier: Diagnosis of  By: Ernest Mallick LPN, Rena   Last Assessment & Plan:  Nl exam and many features of migraine  Waking up with them Ref to pulm to eval for sleep apnea -also loud snoring and fatigue  Then re eval Adv to take imitrex early in ha  Phenergan for nausea prn    Plantar fasciitis    Vertigo, intermittent    Past Surgical History:  Procedure Laterality Date   COLONOSCOPY WITH PROPOFOL N/A 07/13/2020   Procedure: COLONOSCOPY WITH PROPOFOL;  Surgeon: Midge Minium, MD;  Location: [facility] ENDOSCOPY;  Service: Endoscopy;  Laterality: N/A;   HERNIA REPAIR     as a teen   KNEE ARTHROSCOPY     LAMINECTOMY     LS disc sx   robot assisted laprascopy with colectomy   01/23/2019   sigmoid colectomy from diverticulitis   TONSILLECTOMY     Social History   Tobacco Use  Smoking status: Former   Smokeless tobacco: Former    Types: Associate Professor Use: Never used  Substance Use Topics   Alcohol use: No    Alcohol/week: 0.0 standard drinks of alcohol   Drug use: No   Family History  Problem Relation Age of Onset   Heart disease Mother        CAD   Alcohol abuse Father    Diabetes Father    Heart disease Father        CAD   Nephrolithiasis Father    Obesity Brother    Heart attack Brother 36   Cancer Paternal Uncle        bladder CA   Heart disease Sister 63       CAD with 4 stents   Allergies  Allergen Reactions   Pollen Extract    Current Outpatient Medications on File Prior to Visit  Medication Sig Dispense Refill   amLODipine (NORVASC) 5 MG  tablet Take 5 mg by mouth daily as needed (bp over 150/90).     lisinopril-hydrochlorothiazide (ZESTORETIC) 20-25 MG tablet TAKE 1 TABLET BY MOUTH DAILY 90 tablet 1   Multiple Vitamin (MULTI-VITAMIN) tablet Take by mouth.     Omega-3 Fatty Acids (FISH OIL PO) Take 1 capsule by mouth daily.     rosuvastatin (CRESTOR) 10 MG tablet Take 1 tablet (10 mg total) by mouth daily. 90 tablet 3   No current facility-administered medications on file prior to visit.    Review of Systems  Constitutional:  Negative for activity change, appetite change, fatigue, fever and unexpected weight change.  HENT:  Negative for congestion, rhinorrhea, sore throat and trouble swallowing.   Eyes:  Negative for pain, redness, itching and visual disturbance.  Respiratory:  Negative for cough, chest tightness, shortness of breath and wheezing.   Cardiovascular:  Negative for chest pain and palpitations.  Gastrointestinal:  Positive for abdominal pain. Negative for abdominal distention, anal bleeding, blood in stool, constipation, diarrhea, nausea, rectal pain and vomiting.  Endocrine: Negative for cold intolerance, heat intolerance, polydipsia and polyuria.  Genitourinary:  Negative for difficulty urinating, dysuria, frequency and urgency.  Musculoskeletal:  Negative for arthralgias, joint swelling and myalgias.  Skin:  Negative for pallor and rash.  Neurological:  Negative for dizziness, tremors, weakness, numbness and headaches.  Hematological:  Negative for adenopathy. Does not bruise/bleed easily.  Psychiatric/Behavioral:  Negative for decreased concentration and dysphoric mood. The patient is not nervous/anxious.        Objective:   Physical Exam Constitutional:      General: He is not in acute distress.    Appearance: He is well-developed. He is obese.  HENT:     Head: Normocephalic and atraumatic.  Eyes:     Conjunctiva/sclera: Conjunctivae normal.     Pupils: Pupils are equal, round, and reactive to  light.  Neck:     Thyroid: No thyromegaly.     Vascular: No carotid bruit or JVD.  Cardiovascular:     Rate and Rhythm: Normal rate and regular rhythm.     Heart sounds: Normal heart sounds.     No gallop.  Pulmonary:     Effort: Pulmonary effort is normal. No respiratory distress.     Breath sounds: Normal breath sounds. No wheezing or rales.  Abdominal:     General: Abdomen is protuberant. Bowel sounds are normal. There is no distension or abdominal bruit.     Palpations: Abdomen is soft. There is no  fluid wave, hepatomegaly, splenomegaly, mass or pulsatile mass.     Tenderness: There is no right CVA tenderness, left CVA tenderness, guarding or rebound. Negative signs include Murphy's sign and McBurney's sign.     Comments: Small umbilical hernia   Musculoskeletal:     Cervical back: Normal range of motion and neck supple.     Right lower leg: No edema.     Left lower leg: No edema.     Comments: No LS tenderness No muscular flank tenderness   Pain with twist and lateral bend to the L today  Lymphadenopathy:     Cervical: No cervical adenopathy.  Skin:    General: Skin is warm and dry.     Coloration: Skin is not pale.     Findings: No rash.  Neurological:     Mental Status: He is alert.     Sensory: No sensory deficit.     Motor: No weakness.     Coordination: Coordination normal.     Deep Tendon Reflexes: Reflexes are normal and symmetric. Reflexes normal.  Psychiatric:        Mood and Affect: Mood normal.           Assessment & Plan:   Problem List Items Addressed This Visit       Other   Right lower quadrant pain - Primary    Acute on chronic ever since his sigmoid colectomy (for diverticulitis) in 2021  Was seen in aug by NP Dugal as well with reassuring CT and labs  Reviewed last gen surg note from aug as well as notes from [employer] and last labs and imaging in detail   Pain is episodic and colicky (also somewhat positional)  Reassuring exam today  without much tenderness/ pain does get worse with standing  Ua neg Stat cbc and cmet pending Px flexeril to see if this may be msk in nature  ER precautions noted      Relevant Orders   CBC with Differential/Platelet   Comprehensive metabolic panel   POCT Urinalysis Dipstick (Automated) (Completed)

## 2022-08-14 NOTE — Patient Instructions (Addendum)
Lab and urinalysis now   We will call with results   Try the flexeril (muscle relaxer)  Change position slowly  Try to use a gentle warm compress for 10 minutes at a time   Watch for a rash in the area of pain  Stay hydrated   If unbearable - go to the ER / call 911

## 2022-12-12 ENCOUNTER — Other Ambulatory Visit: Payer: Self-pay | Admitting: Family Medicine

## 2022-12-13 NOTE — Telephone Encounter (Signed)
Pt has had a few acute appts., but no recent or future f/u or CPE appts., please advise

## 2022-12-13 NOTE — Telephone Encounter (Signed)
Patient scheduled.

## 2022-12-13 NOTE — Telephone Encounter (Signed)
Refilled once Please schedule either annual with fasting lab prior or follow up (his pref)

## 2023-01-08 ENCOUNTER — Other Ambulatory Visit (INDEPENDENT_AMBULATORY_CARE_PROVIDER_SITE_OTHER): Payer: Commercial Managed Care - PPO

## 2023-01-08 DIAGNOSIS — E7800 Pure hypercholesterolemia, unspecified: Secondary | ICD-10-CM

## 2023-01-08 DIAGNOSIS — K760 Fatty (change of) liver, not elsewhere classified: Secondary | ICD-10-CM | POA: Diagnosis not present

## 2023-01-08 LAB — AST: AST: 20 U/L (ref 0–37)

## 2023-01-08 LAB — LIPID PANEL
Cholesterol: 155 mg/dL (ref 0–200)
HDL: 44.6 mg/dL (ref >39.00)
LDL Cholesterol: 94 mg/dL (ref 0–99)
NonHDL: 110.89
Total CHOL/HDL Ratio: 3
Triglycerides: 85 mg/dL (ref 0.0–149.0)
VLDL: 17 mg/dL (ref 0.0–40.0)

## 2023-01-08 LAB — ALT: ALT: 18 U/L (ref 0–53)

## 2023-01-15 ENCOUNTER — Encounter: Payer: Self-pay | Admitting: Family Medicine

## 2023-01-15 ENCOUNTER — Ambulatory Visit (INDEPENDENT_AMBULATORY_CARE_PROVIDER_SITE_OTHER): Payer: Commercial Managed Care - PPO | Admitting: Family Medicine

## 2023-01-15 VITALS — BP 166/96 | HR 70 | Temp 97.3°F | Ht 64.0 in | Wt 203.2 lb

## 2023-01-15 DIAGNOSIS — Z0000 Encounter for general adult medical examination without abnormal findings: Secondary | ICD-10-CM | POA: Diagnosis not present

## 2023-01-15 DIAGNOSIS — I700 Atherosclerosis of aorta: Secondary | ICD-10-CM

## 2023-01-15 DIAGNOSIS — E6609 Other obesity due to excess calories: Secondary | ICD-10-CM

## 2023-01-15 DIAGNOSIS — Z8249 Family history of ischemic heart disease and other diseases of the circulatory system: Secondary | ICD-10-CM

## 2023-01-15 DIAGNOSIS — E7800 Pure hypercholesterolemia, unspecified: Secondary | ICD-10-CM

## 2023-01-15 DIAGNOSIS — I10 Essential (primary) hypertension: Secondary | ICD-10-CM

## 2023-01-15 DIAGNOSIS — K760 Fatty (change of) liver, not elsewhere classified: Secondary | ICD-10-CM | POA: Diagnosis not present

## 2023-01-15 DIAGNOSIS — Z125 Encounter for screening for malignant neoplasm of prostate: Secondary | ICD-10-CM | POA: Diagnosis not present

## 2023-01-15 DIAGNOSIS — R7303 Prediabetes: Secondary | ICD-10-CM | POA: Insufficient documentation

## 2023-01-15 DIAGNOSIS — Z6834 Body mass index (BMI) 34.0-34.9, adult: Secondary | ICD-10-CM

## 2023-01-15 DIAGNOSIS — Z131 Encounter for screening for diabetes mellitus: Secondary | ICD-10-CM | POA: Diagnosis not present

## 2023-01-15 LAB — CBC WITH DIFFERENTIAL/PLATELET
Basophils Absolute: 0.1 10*3/uL (ref 0.0–0.1)
Basophils Relative: 1 % (ref 0.0–3.0)
Eosinophils Absolute: 0.2 10*3/uL (ref 0.0–0.7)
Eosinophils Relative: 3.2 % (ref 0.0–5.0)
HCT: 40.8 % (ref 39.0–52.0)
Hemoglobin: 13.9 g/dL (ref 13.0–17.0)
Lymphocytes Relative: 29.9 % (ref 12.0–46.0)
Lymphs Abs: 2 10*3/uL (ref 0.7–4.0)
MCHC: 34 g/dL (ref 30.0–36.0)
MCV: 87.8 fl (ref 78.0–100.0)
Monocytes Absolute: 0.7 10*3/uL (ref 0.1–1.0)
Monocytes Relative: 9.6 % (ref 3.0–12.0)
Neutro Abs: 3.8 10*3/uL (ref 1.4–7.7)
Neutrophils Relative %: 56.3 % (ref 43.0–77.0)
Platelets: 280 10*3/uL (ref 150.0–400.0)
RBC: 4.64 Mil/uL (ref 4.22–5.81)
RDW: 12.7 % (ref 11.5–15.5)
WBC: 6.8 10*3/uL (ref 4.0–10.5)

## 2023-01-15 LAB — BASIC METABOLIC PANEL
BUN: 12 mg/dL (ref 6–23)
CO2: 28 mEq/L (ref 19–32)
Calcium: 9.3 mg/dL (ref 8.4–10.5)
Chloride: 104 mEq/L (ref 96–112)
Creatinine, Ser: 0.82 mg/dL (ref 0.40–1.50)
GFR: 98.63 mL/min (ref >60.00)
Glucose, Bld: 97 mg/dL (ref 70–99)
Potassium: 3.8 mEq/L (ref 3.5–5.1)
Sodium: 141 mEq/L (ref 135–145)

## 2023-01-15 LAB — PSA: PSA: 0.23 ng/mL (ref 0.10–4.00)

## 2023-01-15 LAB — HEMOGLOBIN A1C: Hgb A1c MFr Bld: 5.9 % (ref 4.6–6.5)

## 2023-01-15 LAB — TSH: TSH: 3.82 u[IU]/mL (ref 0.35–5.50)

## 2023-01-15 NOTE — Assessment & Plan Note (Signed)
No symptoms  Stressed importance of good bp and lipid control Lipids are improved with crestor 10 mg daily  Bp is up = off med/ will get back on it asap

## 2023-01-15 NOTE — Assessment & Plan Note (Signed)
Psa ordered No voiding changes No family hx

## 2023-01-15 NOTE — Assessment & Plan Note (Signed)
Discussed how this problem influences overall health and the risks it imposes  Reviewed plan for weight loss with lower calorie diet (via better food choices and also portion control or program like weight watchers) and exercise building up to or more than 30 minutes 5 days per week including some aerobic activity    

## 2023-01-15 NOTE — Assessment & Plan Note (Signed)
LFTs in normal range  Working on dietary changes

## 2023-01-15 NOTE — Patient Instructions (Addendum)
Please get started back on your blood pressure medicine  Use reminders if needed   Follow up in 4-5 weeks for blood pressure- make sure to take pill that day    Stay active  Add some strength training to your routine, this is important for bone and brain health and can reduce your risk of falls and help your body use insulin properly and regulate weight  Light weights, exercise bands , and internet videos are a good way to start  Yoga (chair or regular), machines , floor exercises or a gym with machines are also good options  Join the gym   Take care of yourself   Your cholesterol and liver tests look better   Labs today

## 2023-01-15 NOTE — Assessment & Plan Note (Signed)
Disc goals for lipids and reasons to control them Rev last labs with pt Rev low sat fat diet in detail  Improved with crestor 10 mg daily  LDL of 94 and trig of 85  Also better diet-commended

## 2023-01-15 NOTE — Progress Notes (Signed)
Subjective:    Patient ID: Albert Tran, male    DOB: 10-15-1965, 57 y.o.   MRN: [id number]  HPI Here for health maintenance exam and to review chronic medical problems    Wt Readings from Last 3 Encounters:  01/15/23 203 lb 4 oz (92.2 kg)  08/14/22 201 lb 8 oz (91.4 kg)  04/19/22 204 lb (92.5 kg)   34.89 kg/m  Working a lot more lately  Due to an audit at work- once yearly is bad Now recovering from that    Vitals:   01/15/23 0855  BP: (!) 166/96  Pulse: 70  Temp: (!) 97.3 F (36.3 C)  SpO2: 96%   Immunization History  Administered Date(s) Administered   PFIZER(Purple Top)SARS-COV-2 Vaccination 03/24/2020, 04/14/2020   Td 06/09/2010    Health Maintenance Due  Topic Date Due   Hepatitis C Screening  Never done   DTaP/Tdap/Td (2 - Tdap) 06/09/2020   Due for a tetanus shot   Prostate health No prostate cancer in family  No problems emptying  No nocturia     Colonoscopy 07/2020 -normal  Has had a sigmoidectomy for diverticulitis    Mood PHQ of 0    HTN Did not take bp medicine this weekend- got bus and forgot  No cp or palpitations or headaches or edema  No side effects to medicines  BP Readings from Last 3 Encounters:  01/15/23 (!) 166/96  08/14/22 (!) 158/92  04/19/22 (!) 162/116    Takes lisinopril hct 20-25 mg daily  Amlodipine 5 mg daily prn if systolic over 150   Lab Results  Component Value Date   CREATININE 0.73 08/14/2022   BUN 13 08/14/2022   NA 137 08/14/2022   K 3.5 08/14/2022   CL 102 08/14/2022   CO2 29 08/14/2022     Hyperlipidemia Lab Results  Component Value Date   CHOL 155 01/08/2023   CHOL 206 (H) 01/13/2021   CHOL 265 (H) 05/08/2017   Lab Results  Component Value Date   HDL 44.60 01/08/2023   HDL 41.40 01/13/2021   HDL 37.70 (L) 05/08/2017   Lab Results  Component Value Date   LDLCALC 94 01/08/2023   LDLCALC 130 (H) 01/13/2021   Lab Results  Component Value Date   TRIG 85.0 01/08/2023   TRIG  172.0 (H) 01/13/2021   TRIG (H) 05/08/2017    1080.0 Triglyceride is over 400; calculations on Lipids are invalid.   Lab Results  Component Value Date   CHOLHDL 3 01/08/2023   CHOLHDL 5 01/13/2021   CHOLHDL 7 05/08/2017   Lab Results  Component Value Date   LDLDIRECT 128.0 05/08/2017   LDLDIRECT 103.0 12/24/2015   LDLDIRECT 135.0 09/14/2015   H/o aortic atheroslerosis  Taking crestor 10 mg daily  Eating salad and chicken and less red meat    No regular exercise  Has a new gym close to him  Thinking about joining and motivated to do that  Has used some weights at home     H/o fatty liver Lab Results  Component Value Date   ALT 18 01/08/2023   AST 20 01/08/2023   ALKPHOS 58 08/14/2022   BILITOT 0.9 08/14/2022     Patient Active Problem List   Diagnosis Date Noted   Routine general medical examination at a health care facility 01/15/2023   Prostate cancer screening 01/15/2023   Diabetes mellitus screening 01/15/2023   Aortic atherosclerosis (HCC) 04/18/2022   Calculus of gallbladder without cholecystitis without obstruction  04/18/2022   S/P laparoscopic-assisted sigmoidectomy 01/06/2020   Family history of early CAD 09/12/2016   Disorder of patella 11/15/2015   Essential hypertension 08/09/2015   Obesity 08/09/2015   Snoring 03/22/2011   Fatty liver 06/29/2010   Cholelithiasis 06/14/2010   Hyperlipidemia 06/09/2010   Plantar fasciitis, left 04/05/2010   CARPAL TUNNEL SYNDROME 10/11/2007   Past Medical History:  Diagnosis Date   Carpal tunnel syndrome    DDD (degenerative disc disease)    Family history of adverse reaction to anesthesia    Nephew had malignant hypothermia.   Fatty liver    Gallstones    Headache(784.0)    likely migraines   Hypertension    Hypertriglyceridemia    Nosebleed    Hx of   Open fracture of ankle 06/09/2010   Overview:  Overview:  Qualifier: Diagnosis of  By: Ernest Mallick LPN, Rena   Last Assessment & Plan:  Nl exam and many  features of migraine  Waking up with them Ref to pulm to eval for sleep apnea -also loud snoring and fatigue  Then re eval Adv to take imitrex early in ha  Phenergan for nausea prn    Plantar fasciitis    Vertigo, intermittent    Past Surgical History:  Procedure Laterality Date   COLONOSCOPY WITH PROPOFOL N/A 07/13/2020   Procedure: COLONOSCOPY WITH PROPOFOL;  Surgeon: Midge Minium, MD;  Location: [facility];  Service: Endoscopy;  Laterality: N/A;   HERNIA REPAIR     as a teen   KNEE ARTHROSCOPY     LAMINECTOMY     LS disc sx   robot assisted laprascopy with colectomy   01/23/2019   sigmoid colectomy from diverticulitis   TONSILLECTOMY     Social History   Tobacco Use   Smoking status: Former   Smokeless tobacco: Former    Types: Associate Professor Use: Never used  Substance Use Topics   Alcohol use: No    Alcohol/week: 0.0 standard drinks of alcohol   Drug use: No   Family History  Problem Relation Age of Onset   Heart disease Mother        CAD   Alcohol abuse Father    Diabetes Father    Heart disease Father        CAD   Nephrolithiasis Father    Obesity Brother    Heart attack Brother 62   Cancer Paternal Uncle        bladder CA   Heart disease Sister 110       CAD with 4 stents   Allergies  Allergen Reactions   Pollen Extract    Current Outpatient Medications on File Prior to Visit  Medication Sig Dispense Refill   amLODipine (NORVASC) 5 MG tablet Take 5 mg by mouth daily as needed (bp over 150/90).     cyclobenzaprine (FLEXERIL) 10 MG tablet Take 0.5-1 tablets (5-10 mg total) by mouth 3 (three) times daily as needed for muscle spasms. Caution of sedation 20 tablet 0   lisinopril-hydrochlorothiazide (ZESTORETIC) 20-25 MG tablet TAKE 1 TABLET BY MOUTH DAILY 90 tablet 0   Multiple Vitamin (MULTI-VITAMIN) tablet Take by mouth.     Omega-3 Fatty Acids (FISH OIL PO) Take 1 capsule by mouth daily.     rosuvastatin (CRESTOR) 10 MG tablet Take 1 tablet  (10 mg total) by mouth daily. 90 tablet 3   No current facility-administered medications on file prior to visit.      Review of  Systems  Constitutional:  Positive for fatigue. Negative for activity change, appetite change, fever and unexpected weight change.       Some fatigue from intense work schedule recently  HENT:  Negative for congestion, rhinorrhea, sore throat and trouble swallowing.   Eyes:  Negative for pain, redness, itching and visual disturbance.  Respiratory:  Negative for cough, chest tightness, shortness of breath and wheezing.   Cardiovascular:  Negative for chest pain and palpitations.  Gastrointestinal:  Negative for abdominal pain, blood in stool, constipation, diarrhea and nausea.  Endocrine: Negative for cold intolerance, heat intolerance, polydipsia and polyuria.  Genitourinary:  Negative for difficulty urinating, dysuria, frequency and urgency.  Musculoskeletal:  Negative for arthralgias, joint swelling and myalgias.  Skin:  Negative for pallor and rash.  Neurological:  Negative for dizziness, tremors, weakness, numbness and headaches.  Hematological:  Negative for adenopathy. Does not bruise/bleed easily.  Psychiatric/Behavioral:  Negative for decreased concentration and dysphoric mood. The patient is not nervous/anxious.        Objective:   Physical Exam Constitutional:      General: He is not in acute distress.    Appearance: Normal appearance. He is well-developed. He is obese. He is not ill-appearing or diaphoretic.  HENT:     Head: Normocephalic and atraumatic.     Right Ear: Tympanic membrane, ear canal and external ear normal.     Left Ear: Tympanic membrane, ear canal and external ear normal.     Nose: Nose normal. No congestion.     Mouth/Throat:     Mouth: Mucous membranes are moist.     Pharynx: Oropharynx is clear. No posterior oropharyngeal erythema.  Eyes:     General: No scleral icterus.       Right eye: No discharge.        Left eye: No  discharge.     Conjunctiva/sclera: Conjunctivae normal.     Pupils: Pupils are equal, round, and reactive to light.  Neck:     Thyroid: No thyromegaly.     Vascular: No carotid bruit or JVD.  Cardiovascular:     Rate and Rhythm: Normal rate and regular rhythm.     Pulses: Normal pulses.     Heart sounds: Normal heart sounds.     No gallop.  Pulmonary:     Effort: Pulmonary effort is normal. No respiratory distress.     Breath sounds: Normal breath sounds. No wheezing or rales.     Comments: Good air exch Chest:     Chest wall: No tenderness.  Abdominal:     General: Bowel sounds are normal. There is no distension or abdominal bruit.     Palpations: Abdomen is soft. There is no mass.     Tenderness: There is no abdominal tenderness.     Hernia: No hernia is present.  Musculoskeletal:        General: No tenderness.     Cervical back: Normal range of motion and neck supple. No rigidity. No muscular tenderness.     Right lower leg: No edema.     Left lower leg: No edema.  Lymphadenopathy:     Cervical: No cervical adenopathy.  Skin:    General: Skin is warm and dry.     Coloration: Skin is not pale.     Findings: No erythema or rash.     Comments: Solar lentigines diffusely   Neurological:     Mental Status: He is alert.     Cranial Nerves: No cranial nerve  deficit.     Motor: No abnormal muscle tone.     Coordination: Coordination normal.     Gait: Gait normal.     Deep Tendon Reflexes: Reflexes are normal and symmetric. Reflexes normal.  Psychiatric:        Mood and Affect: Mood normal.        Cognition and Memory: Cognition normal.           Assessment & Plan:   Problem List Items Addressed This Visit       Cardiovascular and Mediastinum   Aortic atherosclerosis (HCC)    No symptoms  Stressed importance of good bp and lipid control Lipids are improved with crestor 10 mg daily  Bp is up = off med/ will get back on it asap      Essential hypertension     bp is high after skipping bp medicine during a stressful work time BP Readings from Last 1 Encounters:  01/15/23 (!) 166/96  Urged to get back on track  Most recent labs reviewed  Disc lifstyle change with low sodium diet and exercise  Plan to continue lisinopril hct 20-25 mg daily  Prn amlodipine for spiking bp (systolic over 150) Has made some good dietary changes  Labs planned  Enc good health habits       Relevant Orders   Basic metabolic panel (Completed)   CBC with Differential/Platelet (Completed)   TSH (Completed)     Digestive   Fatty liver    LFTs in normal range  Working on dietary changes        Other   Diabetes mellitus screening    Pt has bmi of 34.8 putting him at risk A1c ordered  Encouraged to keep working on healthy diet and exercise       Relevant Orders   Hemoglobin A1c (Completed)   Family history of early CAD    Pt had low risk stress testing and echo in 2018 with cardiology On statin for cholesterol  Treating bp      Hyperlipidemia    Disc goals for lipids and reasons to control them Rev last labs with pt Rev low sat fat diet in detail  Improved with crestor 10 mg daily  LDL of 94 and trig of 85  Also better diet-commended      Obesity    Discussed how this problem influences overall health and the risks it imposes  Reviewed plan for weight loss with lower calorie diet (via better food choices and also portion control or program like weight watchers) and exercise building up to or more than 30 minutes 5 days per week including some aerobic activity        Prostate cancer screening    Psa ordered No voiding changes No family hx       Relevant Orders   PSA (Completed)   Routine general medical examination at a health care facility - Primary    Reviewed health habits including diet and exercise and skin cancer prevention Reviewed appropriate screening tests for age  Also reviewed health mt list, fam hx and immunization status ,  as well as social and family history   See HPI Labs reviewed and ordered See HPI Due for tetanus shot-will consider at bp follow up  Psa ordered /no clinical voiding changes PHQ score of 0 Encouraged pt to join gym as planned  Encouraged good self care

## 2023-01-15 NOTE — Assessment & Plan Note (Signed)
bp is high after skipping bp medicine during a stressful work time BP Readings from Last 1 Encounters:  01/15/23 (!) 166/96   Urged to get back on track  Most recent labs reviewed  Disc lifstyle change with low sodium diet and exercise  Plan to continue lisinopril hct 20-25 mg daily  Prn amlodipine for spiking bp (systolic over 150) Has made some good dietary changes  Labs planned  Enc good health habits

## 2023-01-15 NOTE — Assessment & Plan Note (Signed)
Reviewed health habits including diet and exercise and skin cancer prevention Reviewed appropriate screening tests for age  Also reviewed health mt list, fam hx and immunization status , as well as social and family history   See HPI Labs reviewed and ordered See HPI Due for tetanus shot-will consider at bp follow up  Psa ordered /no clinical voiding changes PHQ score of 0 Encouraged pt to join gym as planned  Encouraged good self care

## 2023-01-15 NOTE — Assessment & Plan Note (Signed)
Pt has bmi of 34.8 putting him at risk A1c ordered  Encouraged to keep working on healthy diet and exercise

## 2023-01-15 NOTE — Assessment & Plan Note (Signed)
Pt had low risk stress testing and echo in 2018 with cardiology On statin for cholesterol  Treating bp

## 2023-01-16 ENCOUNTER — Encounter: Payer: Self-pay | Admitting: *Deleted

## 2023-02-19 ENCOUNTER — Encounter: Payer: Self-pay | Admitting: Family Medicine

## 2023-02-19 ENCOUNTER — Ambulatory Visit (INDEPENDENT_AMBULATORY_CARE_PROVIDER_SITE_OTHER): Payer: Commercial Managed Care - PPO | Admitting: Family Medicine

## 2023-02-19 VITALS — BP 131/84 | HR 64 | Temp 97.6°F | Ht 64.5 in | Wt 202.0 lb

## 2023-02-19 DIAGNOSIS — I10 Essential (primary) hypertension: Secondary | ICD-10-CM

## 2023-02-19 DIAGNOSIS — F430 Acute stress reaction: Secondary | ICD-10-CM

## 2023-02-19 DIAGNOSIS — Z131 Encounter for screening for diabetes mellitus: Secondary | ICD-10-CM

## 2023-02-19 DIAGNOSIS — E7800 Pure hypercholesterolemia, unspecified: Secondary | ICD-10-CM | POA: Diagnosis not present

## 2023-02-19 NOTE — Patient Instructions (Addendum)
Try to get most of your carbohydrates from produce (with the exception of white potatoes)  Eat less bread/pasta/rice/snack foods/cereals/sweets and other items from the middle of the grocery store (processed carbs)  Blood pressure looks better Continue current medicines   Take care of yourself   Goal top bp is 140 or less  Bottom 90 or less

## 2023-02-19 NOTE — Assessment & Plan Note (Signed)
Bp is improved BP: 131/84  Is back on track with medication  Lisinopril hct 20-25 mg daily  Has amlodipine 5 mg prn for systolic over 150   High stress with work-he thinks this makes bp higher at times Encouraged to start checking at home when relaxed

## 2023-02-19 NOTE — Assessment & Plan Note (Signed)
Lab Results  Component Value Date   HGBA1C 5.9 01/15/2023   disc imp of low glycemic diet and wt loss to prevent DM2

## 2023-02-19 NOTE — Assessment & Plan Note (Signed)
Pt working on stress management Trying to take his time and be more in the moment   Also looking for another job (open to small cut in pay for less responsibility)   Encouraged good self care and exercise when he can fit it in

## 2023-02-19 NOTE — Assessment & Plan Note (Signed)
Disc goals for lipids and reasons to control them Rev last labs with pt Rev low sat fat diet in detail  Improved with crestor 10 mg daily  LDL of 94 and trig 85  Also better diet-commended

## 2023-02-19 NOTE — Progress Notes (Signed)
Subjective:    Patient ID: Albert Tran, male    DOB: 28-Mar-1966, 57 y.o.   MRN: [id number]  HPI  Wt Readings from Last 3 Encounters:  02/19/23 202 lb (91.6 kg)  01/15/23 203 lb 4 oz (92.2 kg)  08/14/22 201 lb 8 oz (91.4 kg)   34.14 kg/m  Vitals:   02/19/23 0801 02/19/23 0817  BP: 134/78 131/84  Pulse: 64   Temp: 97.6 F (36.4 C)   SpO2: 97%      Last visit bp was elevated after skipping bp medicine during stressful work period  Was urged to get back on track   Lisinopril hct 20-25 mg daily  Prn amlodipine prn for systolic over 150   BP Readings from Last 3 Encounters:  02/19/23 131/84  01/15/23 (!) 166/96  08/14/22 (!) 158/92   Pulse Readings from Last 3 Encounters:  02/19/23 64  01/15/23 70  08/14/22 70   Bp is improved Working on some stress management techniques Trying to be in the moment , taking his time   Looking for a new job (which would help and give him some time for self care)     CMP     Component Value Date/Time   NA 141 01/15/2023 0930   NA 141 07/09/2013 2256   K 3.8 01/15/2023 0930   K 3.9 07/09/2013 2256   CL 104 01/15/2023 0930   CL 111 (H) 07/09/2013 2256   CO2 28 01/15/2023 0930   CO2 26 07/09/2013 2256   GLUCOSE 97 01/15/2023 0930   GLUCOSE 180 (H) 07/09/2013 2256   BUN 12 01/15/2023 0930   BUN 23 (H) 07/09/2013 2256   CREATININE 0.82 01/15/2023 0930   CREATININE 1.17 07/09/2013 2256   CALCIUM 9.3 01/15/2023 0930   CALCIUM 8.6 07/09/2013 2256   PROT 7.4 08/14/2022 1254   PROT 7.0 07/09/2013 2256   ALBUMIN 4.5 08/14/2022 1254   ALBUMIN 3.7 07/09/2013 2256   AST 20 01/08/2023 0744   AST 32 07/09/2013 2256   ALT 18 01/08/2023 0744   ALT 48 07/09/2013 2256   ALKPHOS 58 08/14/2022 1254   ALKPHOS 65 07/09/2013 2256   BILITOT 0.9 08/14/2022 1254   BILITOT 0.7 07/09/2013 2256   GFR 98.63 01/15/2023 0930   GFRNONAA >60 01/07/2020 0535   GFRNONAA >60 07/09/2013 2256   Lab Results  Component Value Date   HGBA1C 5.9  01/15/2023   Lab Results  Component Value Date   CHOL 155 01/08/2023   HDL 44.60 01/08/2023   LDLCALC 94 01/08/2023   LDLDIRECT 128.0 05/08/2017   TRIG 85.0 01/08/2023   CHOLHDL 3 01/08/2023   Crestor 10 mg daily        Patient Active Problem List   Diagnosis Date Noted   Routine general medical examination at a health care facility 01/15/2023   Prostate cancer screening 01/15/2023   Diabetes mellitus screening 01/15/2023   Aortic atherosclerosis (HCC) 04/18/2022   Calculus of gallbladder without cholecystitis without obstruction 04/18/2022   S/P laparoscopic-assisted sigmoidectomy 01/06/2020   Family history of early CAD 09/12/2016   Disorder of patella 11/15/2015   Stress reaction 09/14/2015   Essential hypertension 08/09/2015   Obesity 08/09/2015   Snoring 03/22/2011   Fatty liver 06/29/2010   Cholelithiasis 06/14/2010   Hyperlipidemia 06/09/2010   Plantar fasciitis, left 04/05/2010   CARPAL TUNNEL SYNDROME 10/11/2007   Past Medical History:  Diagnosis Date   Carpal tunnel syndrome    DDD (degenerative disc disease)  Family history of adverse reaction to anesthesia    Nephew had malignant hypothermia.   Fatty liver    Gallstones    Headache(784.0)    likely migraines   Hypertension    Hypertriglyceridemia    Nosebleed    Hx of   Open fracture of ankle 06/09/2010   Overview:  Overview:  Qualifier: Diagnosis of  By: Ernest Mallick LPN, Rena   Last Assessment & Plan:  Nl exam and many features of migraine  Waking up with them Ref to pulm to eval for sleep apnea -also loud snoring and fatigue  Then re eval Adv to take imitrex early in ha  Phenergan for nausea prn    Plantar fasciitis    Vertigo, intermittent    Past Surgical History:  Procedure Laterality Date   COLONOSCOPY WITH PROPOFOL N/A 07/13/2020   Procedure: COLONOSCOPY WITH PROPOFOL;  Surgeon: Midge Minium, MD;  Location: [facility] ENDOSCOPY;  Service: Endoscopy;  Laterality: N/A;   HERNIA REPAIR     as a teen    KNEE ARTHROSCOPY     LAMINECTOMY     LS disc sx   robot assisted laprascopy with colectomy   01/23/2019   sigmoid colectomy from diverticulitis   TONSILLECTOMY     Social History   Tobacco Use   Smoking status: Former   Smokeless tobacco: Former    Types: Associate Professor Use: Never used  Substance Use Topics   Alcohol use: No    Alcohol/week: 0.0 standard drinks of alcohol   Drug use: No   Family History  Problem Relation Age of Onset   Heart disease Mother        CAD   Alcohol abuse Father    Diabetes Father    Heart disease Father        CAD   Nephrolithiasis Father    Obesity Brother    Heart attack Brother 28   Cancer Paternal Uncle        bladder CA   Heart disease Sister 38       CAD with 4 stents   Allergies  Allergen Reactions   Pollen Extract    Current Outpatient Medications on File Prior to Visit  Medication Sig Dispense Refill   amLODipine (NORVASC) 5 MG tablet Take 5 mg by mouth daily as needed (bp over 150/90).     lisinopril-hydrochlorothiazide (ZESTORETIC) 20-25 MG tablet TAKE 1 TABLET BY MOUTH DAILY 90 tablet 0   Multiple Vitamin (MULTI-VITAMIN) tablet Take by mouth.     Omega-3 Fatty Acids (FISH OIL PO) Take 1 capsule by mouth daily.     rosuvastatin (CRESTOR) 10 MG tablet Take 1 tablet (10 mg total) by mouth daily. 90 tablet 3   cyclobenzaprine (FLEXERIL) 10 MG tablet Take 0.5-1 tablets (5-10 mg total) by mouth 3 (three) times daily as needed for muscle spasms. Caution of sedation (Patient not taking: Reported on 02/19/2023) 20 tablet 0   No current facility-administered medications on file prior to visit.    Review of Systems  Constitutional:  Negative for activity change, appetite change, fatigue, fever and unexpected weight change.  HENT:  Negative for congestion, rhinorrhea, sore throat and trouble swallowing.   Eyes:  Negative for pain, redness, itching and visual disturbance.  Respiratory:  Negative for cough, chest  tightness, shortness of breath and wheezing.   Cardiovascular:  Negative for chest pain and palpitations.  Gastrointestinal:  Negative for abdominal pain, blood in stool, constipation, diarrhea and nausea.  Endocrine: Negative for cold intolerance, heat intolerance, polydipsia and polyuria.  Genitourinary:  Negative for difficulty urinating, dysuria, frequency and urgency.  Musculoskeletal:  Negative for arthralgias, joint swelling and myalgias.  Skin:  Negative for pallor and rash.  Neurological:  Negative for dizziness, tremors, weakness, numbness and headaches.  Hematological:  Negative for adenopathy. Does not bruise/bleed easily.  Psychiatric/Behavioral:  Negative for decreased concentration and dysphoric mood. The patient is not nervous/anxious.        Very stressed with work        Objective:   Physical Exam Constitutional:      General: He is not in acute distress.    Appearance: Normal appearance. He is well-developed. He is obese. He is not ill-appearing or diaphoretic.  HENT:     Head: Normocephalic and atraumatic.  Eyes:     Conjunctiva/sclera: Conjunctivae normal.     Pupils: Pupils are equal, round, and reactive to light.  Neck:     Thyroid: No thyromegaly.     Vascular: No carotid bruit or JVD.  Cardiovascular:     Rate and Rhythm: Normal rate and regular rhythm.     Heart sounds: Normal heart sounds.     No gallop.  Pulmonary:     Effort: Pulmonary effort is normal. No respiratory distress.     Breath sounds: Normal breath sounds. No wheezing or rales.  Abdominal:     General: There is no distension or abdominal bruit.     Palpations: Abdomen is soft.  Musculoskeletal:     Cervical back: Normal range of motion and neck supple.     Right lower leg: No edema.     Left lower leg: No edema.  Lymphadenopathy:     Cervical: No cervical adenopathy.  Skin:    General: Skin is warm and dry.     Coloration: Skin is not pale.     Findings: No rash.  Neurological:      Mental Status: He is alert.     Coordination: Coordination normal.     Deep Tendon Reflexes: Reflexes are normal and symmetric. Reflexes normal.  Psychiatric:        Mood and Affect: Mood normal.           Assessment & Plan:   Problem List Items Addressed This Visit       Cardiovascular and Mediastinum   Essential hypertension - Primary    Bp is improved BP: 131/84  Is back on track with medication  Lisinopril hct 20-25 mg daily  Has amlodipine 5 mg prn for systolic over 150   High stress with work-he thinks this makes bp higher at times Encouraged to start checking at home when relaxed         Other   Stress reaction    Pt working on stress management Trying to take his time and be more in the moment   Also looking for another job (open to small cut in pay for less responsibility)   Encouraged good self care and exercise when he can fit it in         Hyperlipidemia    Disc goals for lipids and reasons to control them Rev last labs with pt Rev low sat fat diet in detail  Improved with crestor 10 mg daily  LDL of 94 and trig 85  Also better diet-commended      Diabetes mellitus screening    Lab Results  Component Value Date   HGBA1C 5.9 01/15/2023  disc  imp of low glycemic diet and wt loss to prevent DM2

## 2023-03-12 ENCOUNTER — Other Ambulatory Visit: Payer: Self-pay | Admitting: Family Medicine

## 2023-06-20 ENCOUNTER — Encounter: Payer: Self-pay | Admitting: Family Medicine

## 2023-06-20 ENCOUNTER — Ambulatory Visit: Payer: Commercial Managed Care - PPO | Admitting: Family Medicine

## 2023-06-20 VITALS — BP 130/72 | HR 81 | Temp 98.0°F | Ht 64.5 in | Wt 199.1 lb

## 2023-06-20 DIAGNOSIS — M25511 Pain in right shoulder: Secondary | ICD-10-CM | POA: Diagnosis not present

## 2023-06-20 DIAGNOSIS — M7541 Impingement syndrome of right shoulder: Secondary | ICD-10-CM

## 2023-06-20 NOTE — Progress Notes (Signed)
Albert Deines T. Albert Ralston, MD, CAQ Sports Medicine [facility] [address] [place], [address]  Phone: [phone]  FAX: [phone]  Albert Tran - 57 y.o. male  MRN [id number]  Date of Birth: 1966-07-24  Date: 06/20/2023  PCP: Judy Pimple, MD  Referral: Judy Pimple, MD  Chief Complaint  Patient presents with   Shoulder Pain    Right   Subjective:   Albert Tran is a 57 y.o. very pleasant male patient with Body mass index is 33.65 kg/m. who presents with the following:  Ongoing R shoulder pain:   Started some archery in July, and has increased the strength of his bow.  Shot a lot and everything was fine.   About a month ago, a lot of mud came into his house.  Since then, sore.  He had to repeatedly have his arms elevated for an extended period of time when he had his other family members were cleaning them out.  It has then been intermittently painful, and he did have an episode for the bit less than a week where the point of the shoulder was quite tender to palpation.  Throughout this time, his strength is remained normal and his range of motion is also been preserved.  A few days, and it was ok, then a week after that, felt some pain in the shoulder on th R.  No dislocation, fracture, or prior operative intervention.  Review of Systems is noted in the HPI, as appropriate  Objective:   BP 130/72 (BP Location: Left Arm, Patient Position: Sitting, Cuff Size: Large)   Pulse 81   Temp 98 F (36.7 C) (Temporal)   Ht 5' 4.5" (1.638 m)   Wt 199 lb 2 oz (90.3 kg)   SpO2 97%   BMI 33.65 kg/m   GEN: No acute distress; alert,appropriate. PULM: Breathing comfortably in no respiratory distress PSYCH: Normally interactive.   Shoulder: R Inspection: No muscle wasting or winging Ecchymosis/edema: neg  AC joint, scapula, clavicle: NT Cervical spine: NT, full ROM Spurling's: neg Abduction: full, 5/5 Flexion: full,  5/5 IR, lift-off: 5/5, modest restriction of motion ER at neutral: full, 5/5 AC crossover and compression: neg Neer: neg Hawkins: Mildly positive Drop Test: neg Jobe: Mildly positive Supraspinatus insertion: NT Bicipital groove: NT Speed's: neg Yergason's: neg Sulcus sign: neg Scapular dyskinesis: none C5-T1 intact Sensation intact Grip 5/5    Laboratory and Imaging Data:  Assessment and Plan:     ICD-10-CM   1. Acute pain of right shoulder  M25.511     2. Impingement syndrome of right shoulder  M75.41      I think he is having some repetitive motion strain with some impingement at the shoulder.  On exam, he does seem to have tight pectoralis minor as well as some  GIRD (Glenohumeral Internal Rotational Deficiency).  He is not in acute pain today.  I gave him a fairly comprehensive rehab program from [facility] to work on flexibility and strength.  If he continues to have issues or problems with his rehab program, then we can always send him to formal physical therapy.   Disposition: As needed  [employer] One speech-to-text software was used for transcription in this dictation.  Possible transcriptional errors can occur using Animal nutritionist.   Signed,  Elpidio Galea. Debrina Kizer, MD   Outpatient Encounter Medications as of 06/20/2023  Medication Sig   amLODipine (NORVASC) 5 MG tablet Take 5 mg by  mouth daily as needed (bp over 150/90).   lisinopril-hydrochlorothiazide (ZESTORETIC) 20-25 MG tablet TAKE 1 TABLET BY MOUTH DAILY   Multiple Vitamin (MULTI-VITAMIN) tablet Take by mouth.   Omega-3 Fatty Acids (FISH OIL PO) Take 1 capsule by mouth daily.   rosuvastatin (CRESTOR) 10 MG tablet Take 1 tablet (10 mg total) by mouth daily.   [DISCONTINUED] cyclobenzaprine (FLEXERIL) 10 MG tablet Take 0.5-1 tablets (5-10 mg total) by mouth 3 (three) times daily as needed for muscle spasms. Caution of sedation (Patient not taking: Reported on 02/19/2023)   No facility-administered  encounter medications on file as of 06/20/2023.

## 2023-09-02 ENCOUNTER — Emergency Department
Admission: EM | Admit: 2023-09-02 | Discharge: 2023-09-02 | Disposition: A | Discharge status: Home / Self Care | Payer: Commercial Managed Care - PPO | Attending: Student in an Organized Health Care Education/Training Program | Admitting: Student in an Organized Health Care Education/Training Program

## 2023-09-02 ENCOUNTER — Emergency Department: Payer: Commercial Managed Care - PPO

## 2023-09-02 ENCOUNTER — Other Ambulatory Visit: Payer: Self-pay

## 2023-09-02 DIAGNOSIS — N200 Calculus of kidney: Secondary | ICD-10-CM

## 2023-09-02 DIAGNOSIS — R109 Unspecified abdominal pain: Secondary | ICD-10-CM

## 2023-09-02 DIAGNOSIS — N202 Calculus of kidney with calculus of ureter: Secondary | ICD-10-CM | POA: Diagnosis not present

## 2023-09-02 LAB — CBC WITH DIFFERENTIAL/PLATELET
Abs Immature Granulocytes: 0.08 10*3/uL — ABNORMAL HIGH (ref 0.00–0.07)
Basophils Absolute: 0.1 10*3/uL (ref 0.0–0.1)
Basophils Relative: 0 %
Eosinophils Absolute: 0 10*3/uL (ref 0.0–0.5)
Eosinophils Relative: 0 %
HCT: 40.8 % (ref 39.0–52.0)
Hemoglobin: 14.3 g/dL (ref 13.0–17.0)
Immature Granulocytes: 1 %
Lymphocytes Relative: 5 %
Lymphs Abs: 0.7 10*3/uL (ref 0.7–4.0)
MCH: 30 pg (ref 26.0–34.0)
MCHC: 35 g/dL (ref 30.0–36.0)
MCV: 85.5 fL (ref 80.0–100.0)
Monocytes Absolute: 0.6 10*3/uL (ref 0.1–1.0)
Monocytes Relative: 4 %
Neutro Abs: 14.3 10*3/uL — ABNORMAL HIGH (ref 1.7–7.7)
Neutrophils Relative %: 90 %
Platelets: 257 10*3/uL (ref 150–400)
RBC: 4.77 MIL/uL (ref 4.22–5.81)
RDW: 12.1 % (ref 11.5–15.5)
WBC: 15.7 10*3/uL — ABNORMAL HIGH (ref 4.0–10.5)
nRBC: 0 % (ref 0.0–0.2)

## 2023-09-02 LAB — URINALYSIS, ROUTINE W REFLEX MICROSCOPIC
Bacteria, UA: NONE SEEN
Bilirubin Urine: NEGATIVE
Glucose, UA: NEGATIVE mg/dL
Ketones, ur: 5 mg/dL — AB
Leukocytes,Ua: NEGATIVE
Nitrite: NEGATIVE
Protein, ur: NEGATIVE mg/dL
RBC / HPF: >50 RBC/hpf (ref 0–5)
Specific Gravity, Urine: 1.013 (ref 1.005–1.030)
pH: 7 (ref 5.0–8.0)

## 2023-09-02 LAB — COMPREHENSIVE METABOLIC PANEL
ALT: 31 U/L (ref 0–44)
AST: 35 U/L (ref 15–41)
Albumin: 4.3 g/dL (ref 3.5–5.0)
Alkaline Phosphatase: 55 U/L (ref 38–126)
Anion gap: 14 (ref 5–15)
BUN: 18 mg/dL (ref 6–20)
CO2: 22 mmol/L (ref 22–32)
Calcium: 9.2 mg/dL (ref 8.9–10.3)
Chloride: 99 mmol/L (ref 98–111)
Creatinine, Ser: 1.14 mg/dL (ref 0.61–1.24)
GFR, Estimated: >60 mL/min (ref >60)
Glucose, Bld: 148 mg/dL — ABNORMAL HIGH (ref 70–99)
Potassium: 3.7 mmol/L (ref 3.5–5.1)
Sodium: 135 mmol/L (ref 135–145)
Total Bilirubin: 1.6 mg/dL — ABNORMAL HIGH (ref <1.2)
Total Protein: 7.6 g/dL (ref 6.5–8.1)

## 2023-09-02 LAB — LIPASE, BLOOD: Lipase: 42 U/L (ref 11–51)

## 2023-09-02 MED ORDER — KETOROLAC TROMETHAMINE 30 MG/ML IJ SOLN
30.0000 mg | Freq: Once | INTRAMUSCULAR | Status: AC
Start: 2023-09-02 — End: 2023-09-02
  Administered 2023-09-02: 30 mg via INTRAMUSCULAR
  Filled 2023-09-02: qty 1

## 2023-09-02 MED ORDER — OXYCODONE-ACETAMINOPHEN 5-325 MG PO TABS
1.0000 | ORAL_TABLET | Freq: Once | ORAL | Status: AC
  Administered 2023-09-02: 1 via ORAL
  Filled 2023-09-02: qty 1

## 2023-09-02 MED ORDER — ONDANSETRON 4 MG PO TBDP: 4.0000 mg | ORAL_TABLET | Freq: Three times a day (TID) | ORAL | 0 refills | Status: DC | PRN

## 2023-09-02 MED ORDER — TAMSULOSIN HCL 0.4 MG PO CAPS: 0.4000 mg | ORAL_CAPSULE | Freq: Every day | ORAL | 0 refills | Status: DC

## 2023-09-02 MED ORDER — OXYCODONE-ACETAMINOPHEN 5-325 MG PO TABS: 1.0000 | ORAL_TABLET | ORAL | 0 refills | Status: DC | PRN

## 2023-09-02 NOTE — ED Triage Notes (Addendum)
Pt c/o acute onset LLQ pain this morning with some hematuria. Pt denies hx of renal stones. Pt reports constant pain. Pt reports surgical hx to for diverticulitis 2 years ago. Pt reports normal BM today. Pt endorses nausea.

## 2023-09-02 NOTE — ED Provider Notes (Signed)
[facility] Provider Note    Event Date/Time   First MD Initiated Contact with Patient 09/02/23 2033     (approximate)   History   Abdominal Pain   HPI  Albert Tran is a 57 y.o. male no significant past medical history presented the ER for evaluation of left flank pain radiating to his groin.  Did have some associated nausea.  No fevers or chills.  Pain did radiate always in his left testicle.  Was given Percocet in triage with improvement in symptoms.     Physical Exam   Triage Vital Signs: ED Triage Vitals  Encounter Vitals Group     BP 09/02/23 1502 (!) 153/107     Systolic BP Percentile --      Diastolic BP Percentile --      Pulse Rate 09/02/23 1502 67     Resp 09/02/23 1502 (!) 22     Temp 09/02/23 1502 97.6 F (36.4 C)     Temp Source 09/02/23 1502 Oral     SpO2 09/02/23 1502 97 %     Weight --      Height --      Head Circumference --      Peak Flow --      Pain Score 09/02/23 1504 8     Pain Loc --      Pain Education --      Exclude from Growth Chart --     Most recent vital signs: Vitals:   09/02/23 1502  BP: (!) 153/107  Pulse: 67  Resp: (!) 22  Temp: 97.6 F (36.4 C)  SpO2: 97%     Constitutional: Alert  Eyes: Conjunctivae are normal.  Head: Atraumatic. Nose: No congestion/rhinnorhea. Mouth/Throat: Mucous membranes are moist.   Neck: Painless ROM.  Cardiovascular:   Good peripheral circulation. Respiratory: Normal respiratory effort.  No retractions.  Gastrointestinal: Soft and nontender.  Musculoskeletal:  no deformity Neurologic:  MAE spontaneously. No gross focal neurologic deficits are appreciated.  Skin:  Skin is warm, dry and intact. No rash noted. Psychiatric: Mood and affect are normal. Speech and behavior are normal.    ED Results / Procedures / Treatments   Labs (all labs ordered are listed, but only abnormal results are displayed) Labs Reviewed  URINALYSIS, ROUTINE W REFLEX MICROSCOPIC  - Abnormal; Notable for the following components:      Result Value   Color, Urine YELLOW (*)    APPearance HAZY (*)    Hgb urine dipstick LARGE (*)    Ketones, ur 5 (*)    All other components within normal limits  COMPREHENSIVE METABOLIC PANEL - Abnormal; Notable for the following components:   Glucose, Bld 148 (*)    Total Bilirubin 1.6 (*)    All other components within normal limits  CBC WITH DIFFERENTIAL/PLATELET - Abnormal; Notable for the following components:   WBC 15.7 (*)    Neutro Abs 14.3 (*)    Abs Immature Granulocytes 0.08 (*)    All other components within normal limits  LIPASE, BLOOD     EKG     RADIOLOGY Please see ED Course for my review and interpretation.  I personally reviewed all radiographic images ordered to evaluate for the above acute complaints and reviewed radiology reports and findings.  These findings were personally discussed with the patient.  Please see medical record for radiology report.    PROCEDURES:  Critical Care performed:   Procedures   MEDICATIONS ORDERED IN ED:  Medications  ketorolac (TORADOL) 30 MG/ML injection 30 mg (has no administration in time range)  oxyCODONE-acetaminophen (PERCOCET/ROXICET) 5-325 MG per tablet 1 tablet (1 tablet Oral Given 09/02/23 1832)     IMPRESSION / MDM / ASSESSMENT AND PLAN / ED COURSE  I reviewed the triage vital signs and the nursing notes.                              Differential diagnosis includes, but is not limited to, stone, colitis, diverticulitis, musculoskeletal strain, AAA  Patient presenting to the ER for evaluation of symptoms as described above.  Based on symptoms, risk factors and considered above differential, this presenting complaint could reflect a potentially life-threatening illness therefore the patient will be placed on continuous pulse oximetry and telemetry for monitoring.  Laboratory evaluation will be sent to evaluate for the above complaints.       FINAL  CLINICAL IMPRESSION(S) / ED DIAGNOSES   Final diagnoses:  Flank pain  Kidney stone     Rx / DC Orders   ED Discharge Orders          Ordered    oxyCODONE-acetaminophen (PERCOCET) 5-325 MG tablet  Every 4 hours PRN        09/02/23 2047    ondansetron (ZOFRAN-ODT) 4 MG disintegrating tablet  Every 8 hours PRN        09/02/23 2047    tamsulosin (FLOMAX) 0.4 MG CAPS capsule  Daily after supper        09/02/23 2047             Note:  This document was prepared using Dragon voice recognition software and may include unintentional dictation errors.    Willy Eddy, MD 09/02/23 2055

## 2023-09-26 ENCOUNTER — Ambulatory Visit
Admission: RE | Admit: 2023-09-26 | Discharge: 2023-09-26 | Disposition: A | Discharge status: Home / Self Care | Payer: Commercial Managed Care - PPO | Source: Ambulatory Visit | Attending: Urology | Admitting: Urology

## 2023-09-26 ENCOUNTER — Ambulatory Visit: Payer: Commercial Managed Care - PPO | Admitting: Urology

## 2023-09-26 VITALS — BP 176/106 | HR 82 | Ht 65.0 in | Wt 193.0 lb

## 2023-09-26 DIAGNOSIS — N201 Calculus of ureter: Secondary | ICD-10-CM

## 2023-09-26 DIAGNOSIS — Z01818 Encounter for other preprocedural examination: Secondary | ICD-10-CM

## 2023-09-26 DIAGNOSIS — R3129 Other microscopic hematuria: Secondary | ICD-10-CM

## 2023-09-26 DIAGNOSIS — N200 Calculus of kidney: Secondary | ICD-10-CM | POA: Insufficient documentation

## 2023-09-26 LAB — URINALYSIS, COMPLETE
Bilirubin, UA: NEGATIVE
Glucose, UA: NEGATIVE
Ketones, UA: NEGATIVE
Leukocytes,UA: NEGATIVE
Nitrite, UA: NEGATIVE
Protein,UA: NEGATIVE
Specific Gravity, UA: 1.02 (ref 1.005–1.030)
Urobilinogen, Ur: 0.2 mg/dL (ref 0.2–1.0)
pH, UA: 7 (ref 5.0–7.5)

## 2023-09-26 LAB — MICROSCOPIC EXAMINATION: Bacteria, UA: NONE SEEN

## 2023-09-26 NOTE — Progress Notes (Signed)
I,Amy L Pierron,acting as a scribe for Vanna Scotland, MD.,have documented all relevant documentation on the behalf of Vanna Scotland, MD,as directed by  Vanna Scotland, MD while in the presence of Vanna Scotland, MD.  09/26/2023 6:57 PM   Albert Tran 1966/07/17 [id number]  Referring provider: Willy Eddy, MD [address] [place],  [place] [address]  Chief Complaint  Patient presents with   Establish Care   Nephrolithiasis    HPI: 58 year-old male presents today for a follow-up of a kidney stone event.  He was seen and evaluated in the emergency room on 09/02/2023 with acute onset left flank pain. He was found to have a 5 millimeter left distal ureteral calculus with proximal hydroureteronephrosis and some perinephric stranding. He has no additional upper tract stones. His labs indicated that he had a mild leukocytosis of 15.7 and blood in his urine, but no real concern for infection. His creatinine was slightly elevated to 1.14 from his previous baseline of 0.8.  His urinalysis today shows 11 to 30 red blood cells per high powered field, otherwise unremarkable.  He has never had a kidney stone before. One day he had acute onset pain along with hematuria. He went to the ER and was given Flomax which he started taking. Then 3 days later he had about 4 hours of bad pain and hasn't had any since.   He mostly drinks water only.  PMH: Past Medical History:  Diagnosis Date   Carpal tunnel syndrome    DDD (degenerative disc disease)    Family history of adverse reaction to anesthesia    Nephew had malignant hypothermia.   Fatty liver    Gallstones    Headache(784.0)    likely migraines   Hypertension    Hypertriglyceridemia    Nosebleed    Hx of   Open fracture of ankle 06/09/2010   Overview:  Overview:  Qualifier: Diagnosis of  By: Ernest Mallick LPN, Rena   Last Assessment & Plan:  Nl exam and many features of migraine  Waking up with them Ref to pulm to eval for sleep  apnea -also loud snoring and fatigue  Then re eval Adv to take imitrex early in ha  Phenergan for nausea prn    Plantar fasciitis    Vertigo, intermittent     Surgical History: Past Surgical History:  Procedure Laterality Date   COLONOSCOPY WITH PROPOFOL N/A 07/13/2020   Procedure: COLONOSCOPY WITH PROPOFOL;  Surgeon: Midge Minium, MD;  Location: [facility] ENDOSCOPY;  Service: Endoscopy;  Laterality: N/A;   HERNIA REPAIR     as a teen   KNEE ARTHROSCOPY     LAMINECTOMY     LS disc sx   robot assisted laprascopy with colectomy   01/23/2019   sigmoid colectomy from diverticulitis   TONSILLECTOMY      Home Medications:  Allergies as of 09/26/2023       Reactions   Pollen Extract         Medication List        Accurate as of September 26, 2023  6:57 PM. If you have any questions, ask your nurse or doctor.          amLODipine 5 MG tablet Commonly known as: NORVASC Take 5 mg by mouth daily as needed (bp over 150/90).   FISH OIL PO Take 1 capsule by mouth daily.   lisinopril-hydrochlorothiazide 20-25 MG tablet Commonly known as: ZESTORETIC TAKE 1 TABLET BY MOUTH DAILY   Multi-Vitamin tablet Take  by mouth.   ondansetron 4 MG disintegrating tablet Commonly known as: ZOFRAN-ODT Take 1 tablet (4 mg total) by mouth every 8 (eight) hours as needed for nausea or vomiting.   oxyCODONE-acetaminophen 5-325 MG tablet Commonly known as: Percocet Take 1 tablet by mouth every 4 (four) hours as needed for severe pain (pain score 7-10).   rosuvastatin 10 MG tablet Commonly known as: Crestor Take 1 tablet (10 mg total) by mouth daily.   tamsulosin 0.4 MG Caps capsule Commonly known as: FLOMAX Take 1 capsule (0.4 mg total) by mouth daily after supper.        Allergies:  Allergies  Allergen Reactions   Pollen Extract     Family History: Family History  Problem Relation Age of Onset   Heart disease Mother        CAD   Alcohol abuse Father    Diabetes Father    Heart  disease Father        CAD   Nephrolithiasis Father    Obesity Brother    Heart attack Brother 73   Cancer Paternal Uncle        bladder CA   Heart disease Sister 12       CAD with 4 stents    Social History:  reports that he has quit smoking. He has quit using smokeless tobacco.  His smokeless tobacco use included chew. He reports that he does not drink alcohol and does not use drugs.   Physical Exam: BP (!) 176/106   Pulse 82   Ht 5\' 5"  (1.651 m)   Wt 193 lb (87.5 kg)   BMI 32.12 kg/m   Constitutional:  Alert and oriented, No acute distress. HEENT: [place] AT, moist mucus membranes.  Trachea midline, no masses. Neurologic: Grossly intact, no focal deficits, moving all 4 extremities. Psychiatric: Normal mood and affect.   Urinalysis    Component Value Date/Time   COLORURINE YELLOW (A) 09/02/2023 1510   APPEARANCEUR Clear 09/26/2023 1356   LABSPEC 1.013 09/02/2023 1510   PHURINE 7.0 09/02/2023 1510   GLUCOSEU Negative 09/26/2023 1356   HGBUR LARGE (A) 09/02/2023 1510   BILIRUBINUR Negative 09/26/2023 1356   KETONESUR 5 (A) 09/02/2023 1510   PROTEINUR Negative 09/26/2023 1356   PROTEINUR NEGATIVE 09/02/2023 1510   UROBILINOGEN 0.2 08/14/2022 1308   NITRITE Negative 09/26/2023 1356   NITRITE NEGATIVE 09/02/2023 1510   LEUKOCYTESUR Negative 09/26/2023 1356   LEUKOCYTESUR NEGATIVE 09/02/2023 1510    Lab Results  Component Value Date   LABMICR See below: 09/26/2023   WBCUA 0-5 09/26/2023   LABEPIT 0-10 09/26/2023   BACTERIA None seen 09/26/2023    Pertinent Imaging: Results for orders placed during the hospital encounter of 09/02/23  CT Renal Stone Study  Narrative CLINICAL DATA:  Abdominal/flank pain, stone suspected Pt c/o acute onset LLQ pain this morning with some hematuria. Pt denies hx of renal stones. Pt reports constant pain. Pt reports surgical hx to for diverticulitis 2 years ago. Pt reports normal BM today. Pt endorses nausea.  EXAM: CT ABDOMEN AND  PELVIS WITHOUT CONTRAST  TECHNIQUE: Multidetector CT imaging of the abdomen and pelvis was performed following the standard protocol without IV contrast.  RADIATION DOSE REDUCTION: This exam was performed according to the departmental dose-optimization program which includes automated exposure control, adjustment of the mA and/or kV according to patient size and/or use of iterative reconstruction technique.  COMPARISON:  None Available.  FINDINGS: Lower chest: Coronary artery calcification. Possible tiny hiatal hernia.  Hepatobiliary:  No focal liver abnormality. Calcified gallstone noted within the gallbladder lumen. No gallbladder wall thickening or pericholecystic fluid. No biliary dilatation.  Pancreas: No focal lesion. Normal pancreatic contour. No surrounding inflammatory changes. No main pancreatic ductal dilatation.  Spleen: Normal in size without focal abnormality.  Adrenals/Urinary Tract:  No adrenal nodule bilaterally.  5 mm calcified stone along the distal left ureter. Associated asymmetric left perinephric fat stranding and mild left hydroureteronephrosis. No left nephrolithiasis. No right nephroureterolithiasis. No right hydroureteronephrosis.  The urinary bladder is unremarkable.  Stomach/Bowel: Sigmoid resection surgical changes. Stomach is within normal limits. No evidence of bowel wall thickening or dilatation. Few scattered colonic diverticula. Appendix appears normal.  Vascular/Lymphatic: No abdominal aorta or iliac aneurysm. At least mild atherosclerotic plaque of the aorta and its branches. No abdominal, pelvic, or inguinal lymphadenopathy.  Reproductive: Prostate is unremarkable.  Other: No intraperitoneal free fluid. No intraperitoneal free gas. No organized fluid collection.  Musculoskeletal:  No abdominal wall hernia or abnormality.  No suspicious lytic or blastic osseous lesions. No acute displaced fracture. Multilevel degenerative  changes of the spine with intervertebral disc space narrowing at the L4-L5 level as well as posterior disc osteophyte complex formation.  IMPRESSION: 1. Obstructive 5 mm distal left ureterolithiasis. Correlate with urinalysis for superimposed infection. 2. Cholelithiasis with no CT evidence of acute cholecystitis. 3. Few scattered colonic diverticula with no acute diverticulitis. 4. Possible tiny hiatal hernia. 5. Aortic Atherosclerosis (ICD10-I70.0).  Electronically Signed By: Tish Frederickson M.D. On: 09/02/2023 18:55 Personally reviewed the above images and agree with radiologic interpretation.   Assessment & Plan:    1. Left distal ureteral calculus  -  Due to the size of his stone he may pass it, it is almost 30 days since onset of symptoms. Option 1 is to have a KUB to assess the location of the stone. Allow a couple more weeks to see if it passes within the month time frame. If it doesn't pass the plan for another xray.   - We discussed various treatment options for urolithiasis including observation with or without medical expulsive therapy, shockwave lithotripsy (SWL), ureteroscopy and laser lithotripsy with stent placement, and percutaneous nephrolithotomy.  - We discussed that management is based on stone size, location, density, patient co-morbidities, and patient preference.   - Stones <101mm in size have a >80% spontaneous passage rate. Data surrounding the use of tamsulosin for medical expulsive therapy is controversial, but meta analyses suggests it is most efficacious for distal stones between 5-77mm in size. Possible side effects include dizziness/lightheadedness, and retrograde ejaculation.  - SWL has a lower stone free rate in a single procedure, but also a lower complication rate compared to ureteroscopy and avoids a stent and associated stent related symptoms. Possible complications include renal hematoma, steinstrasse, and need for additional treatment. We discussed the  role of his increased skin to stone distance can lead to decreased efficacy with shockwave lithotripsy.  - Ureteroscopy with laser lithotripsy and stent placement has a higher stone free rate than SWL in a single procedure, however increased complication rate including possible infection, ureteral injury, bleeding, and stent related morbidity. Common stent related symptoms include dysuria, urgency/frequency, and flank pain.  - If the xray shows the stone is in the ureter tube he will proceed with lithotripsy next Thursday. If it is in his bladder he would like to give it some more time. He is concerned about the cost of the procedure and getting it covered by his insurance company.  -  Lithotripsy packet given. Pre-op urine obtained.  - We discussed general stone prevention techniques including drinking plenty water with goal of producing 2.5 L urine daily, increased citric acid intake, avoidance of high oxalate containing foods, and decreased salt intake.  Information about dietary recommendations given today.   2. Microscopic hematuria  -  Presumably related to retained stone. Although he's not having any further pain, he's been diligent about straining and hasn't seen the stone pass. If we don't see the stone or he passes it, will need to have him follow up to ensure that the microscopic hematuria resolves. Otherwise he'll need a workup for that.  Return in about 4 weeks (around 10/24/2023) for urinalysis.   [facility] [address], [address] [place], [place] [address] [phone]

## 2023-09-27 ENCOUNTER — Telehealth: Payer: Self-pay | Admitting: Urology

## 2023-09-27 ENCOUNTER — Other Ambulatory Visit: Payer: Self-pay

## 2023-09-27 DIAGNOSIS — N200 Calculus of kidney: Secondary | ICD-10-CM

## 2023-09-27 DIAGNOSIS — N201 Calculus of ureter: Secondary | ICD-10-CM

## 2023-09-27 NOTE — Progress Notes (Signed)
ESWL ORDER FORM  Expected date of procedure: Next   Surgeon: assigned  Post op standing: 2-4wk follow up w/KUB prior  Anticoagulation/Aspirin/NSAID standing order: Hold all 72 hours prior  Anesthesia standing order: MAC  VTE standing: SCD's  Dx: Left Ureteral Stone  Procedure: left Extracorporeal shock wave lithotripsy  CPT : 50590  Standing Order Set:   *NPO after mn, KUB  *NS 161ml/hr, Keflex 500mg  PO, Benadryl 25mg  PO, Valium 10mg  PO, Zofran 4mg  IV  Medications if other than standing orders:   NONE

## 2023-09-27 NOTE — Telephone Encounter (Signed)
There are a lot of of calcifications in the blood vessels and intestines in the pelvis but I think I can still see your stone on this x-ray.  Good to go ahead and fill out a booking sheet for lithotripsy and get you scheduled for it.  Again, if you see the stone pass between now and then, please let us know.  If you would prefer, we can repeat the CT scan before we take you out onto the lithotripsy truck to ensure that the stone is present.  Vanna Scotland, MD

## 2023-09-27 NOTE — Telephone Encounter (Signed)
Patient advised. Patient was asking where is the stone and does not want to proceed with litho on a "maybe," there is a stone. Advised patient we would like to him know final report once radiologist reads it.

## 2023-09-28 NOTE — Telephone Encounter (Signed)
Per Melissa, patient called back and would like to get litho done next week

## 2023-09-29 LAB — CULTURE, URINE COMPREHENSIVE

## 2023-10-01 NOTE — Progress Notes (Signed)
    [facility] ESWL POSTING SHEET        Patient Name: Albert Tran  DOB: 24-Dec-1965  MRN: [id number]  Surgeon:  Irineo Axon, MD  Diagnosis:  Left Ureteral Stone  CPT: 72536  ESWL DATE: 10/04/2023  ESWL TIME: 1115am  Special Needs/Requirements: No       Cardiac/Medical/Pulmonary Clearance needed: no       Form Faxed to [facility]- [phone] Date:   Date: 10/01/23       Form Faxed to [place] [facility]- [phone]  Date:  Date: 10/01/23           Copy Made for Insurance PA:  Date: 10/01/23       Orders Entered in to Epic:  Date: 10/01/23                  2

## 2023-10-03 MED ORDER — SODIUM CHLORIDE 0.9 % IV SOLN: INTRAVENOUS | Status: DC

## 2023-10-03 MED ORDER — CEPHALEXIN 500 MG PO CAPS
500.0000 mg | ORAL_CAPSULE | Freq: Once | ORAL | Status: AC
  Administered 2023-10-04: 500 mg via ORAL

## 2023-10-03 MED ORDER — DIPHENHYDRAMINE HCL 25 MG PO CAPS
25.0000 mg | ORAL_CAPSULE | ORAL | Status: AC
  Administered 2023-10-04: 25 mg via ORAL

## 2023-10-03 MED ORDER — DIAZEPAM 5 MG PO TABS
10.0000 mg | ORAL_TABLET | ORAL | Status: AC
  Administered 2023-10-04: 10 mg via ORAL

## 2023-10-03 MED ORDER — ONDANSETRON HCL 4 MG/2ML IJ SOLN
4.0000 mg | Freq: Once | INTRAMUSCULAR | Status: AC
  Administered 2023-10-04: 4 mg via INTRAVENOUS

## 2023-10-04 ENCOUNTER — Ambulatory Visit: Payer: Commercial Managed Care - PPO

## 2023-10-04 ENCOUNTER — Other Ambulatory Visit: Payer: Self-pay

## 2023-10-04 ENCOUNTER — Other Ambulatory Visit: Payer: Self-pay | Admitting: Urology

## 2023-10-04 ENCOUNTER — Ambulatory Visit
Admission: RE | Admit: 2023-10-04 | Discharge: 2023-10-04 | Disposition: A | Discharge status: Home / Self Care | Payer: Commercial Managed Care - PPO | Attending: Urology | Admitting: Urology

## 2023-10-04 ENCOUNTER — Encounter: Payer: Self-pay | Admitting: Urology

## 2023-10-04 ENCOUNTER — Encounter
Admission: RE | Disposition: A | Discharge status: Home / Self Care | Payer: Self-pay | Source: Home / Self Care | Attending: Urology

## 2023-10-04 DIAGNOSIS — N201 Calculus of ureter: Secondary | ICD-10-CM | POA: Insufficient documentation

## 2023-10-04 HISTORY — PX: EXTRACORPOREAL SHOCK WAVE LITHOTRIPSY: SHX1557

## 2023-10-04 SURGERY — LITHOTRIPSY, ESWL
Anesthesia: Moderate Sedation | Laterality: Left

## 2023-10-04 MED ORDER — ONDANSETRON HCL 4 MG/2ML IJ SOLN
INTRAMUSCULAR | Status: AC
  Filled 2023-10-04: qty 2

## 2023-10-04 MED ORDER — DIAZEPAM 5 MG PO TABS
ORAL_TABLET | ORAL | Status: AC
  Filled 2023-10-04: qty 2

## 2023-10-04 MED ORDER — CEPHALEXIN 500 MG PO CAPS
ORAL_CAPSULE | ORAL | Status: AC
  Filled 2023-10-04: qty 1

## 2023-10-04 MED ORDER — TAMSULOSIN HCL 0.4 MG PO CAPS: 0.4000 mg | ORAL_CAPSULE | Freq: Every day | ORAL | 0 refills | Status: DC

## 2023-10-04 MED ORDER — DIPHENHYDRAMINE HCL 25 MG PO CAPS
ORAL_CAPSULE | ORAL | Status: AC
  Filled 2023-10-04: qty 1

## 2023-10-04 NOTE — Progress Notes (Signed)
Albert Tran was scheduled for SWL this morning.  The stone could not be visualized on 2 plane fluoroscopy and a treatment was not performed.  I discussed the alternative of ureteroscopy however he does not want to schedule unless he is absolutely sure the stone is still present.  He only took tamsulosin for 1 week.  Will schedule follow-up CT and Rx tamsulosin sent to pharmacy.  If stone still present will schedule ureteroscopy with Dr. Apolinar Junes

## 2023-10-05 ENCOUNTER — Encounter: Payer: Self-pay | Admitting: Urology

## 2023-10-08 ENCOUNTER — Telehealth: Payer: Self-pay

## 2023-10-08 NOTE — Telephone Encounter (Signed)
Incoming call from pt on triage line who questions why lithotripsy was performed on him if his stone was not visible on x-ray. The patient states that he was told on multiple occasions the procedure could not be performed if the stone could not be seen. He has viewed his imaging via mychart. The patient now questions if he will be responsible for his ureteroscopy procedure due to litho being unsuccessful. The patient verbalized his frustration with the process and now the need for a CT and possibly more surgery. Advised pt that I would put him in contact with office leadership.

## 2023-10-12 ENCOUNTER — Telehealth: Payer: Self-pay | Admitting: Urology

## 2023-10-12 NOTE — Telephone Encounter (Signed)
 Returned pts call.   Per [facility] advised pt that if they can see the stone on the truck they will proceed with litho.   Pt states he was not given the information that there was a possibility that the litho would not work. He states had he been given this information he would have chosen a different option.  Now he has to pay for a procedure that did not work and have additional procedure and more cost.   Advised I would speak with [facility] regarding this. Pt is aware [facility] will get this message on Tuesday.

## 2023-10-12 NOTE — Telephone Encounter (Signed)
 See previous note

## 2023-10-15 NOTE — Telephone Encounter (Signed)
 My understanding is that he did not have the shockwave because the stone the stone was not visible at the time on the truck.  This can happen if there is been interval passage of the stone or even if a loop of bowel gas on that given day is blocking the view of the stone if it still is present.  He did not have the procedure done and thus he will not pay for that procedure.  He may receive bills however for the IV and facility/nursing.  We did in fact specifically discuss the risk of shockwave failure.  I even added to my note that we specifically talked about his increased stone to skin distance which may lead to decreased efficacy particular to his case.  Here is an excerpt from my note there was also available to him.  "SWL has a lower stone free rate in a single procedure, but also a lower complication rate compared to ureteroscopy and avoids a stent and associated stent related symptoms. Possible complications include renal hematoma, steinstrasse, and need for additional treatment. We discussed the role of his increased skin to stone distance can lead to decreased efficacy with shockwave lithotripsy."  Dustin Gimenez, MD

## 2023-10-17 NOTE — Telephone Encounter (Signed)
Spoke with [facility] and they did not bill for the litho and it was discontinued. Therefore he will not get a bill from them.    The bill he did receive was for pre/post op care at [facility].   Informed him of [facility] response.   Pt given number to billing.

## 2023-10-23 ENCOUNTER — Telehealth: Payer: Self-pay

## 2023-10-23 ENCOUNTER — Ambulatory Visit: Payer: Self-pay | Admitting: Urology

## 2023-10-23 MED ORDER — TAMSULOSIN HCL 0.4 MG PO CAPS: 0.4000 mg | ORAL_CAPSULE | Freq: Every day | ORAL | 0 refills | Status: DC

## 2023-10-23 NOTE — Telephone Encounter (Signed)
Pt called triage line stating he is having lower left back discomfort. Pt states symptoms started at 11 am today Pt states he has tried urinating 3 times today but a small amount of urine comes out. Pt was advise, per Sam, to continue taking flomax and take pain meds to manage pain. Pt was advise to go to the ER if discomfort gets worse or feels like he is going into retention. Pt was advise to call tomorrow morning if symptoms don't improve. Pt voiced understanding.

## 2023-10-26 ENCOUNTER — Ambulatory Visit
Admission: RE | Admit: 2023-10-26 | Discharge: 2023-10-26 | Disposition: A | Discharge status: Home / Self Care | Payer: Commercial Managed Care - PPO | Source: Ambulatory Visit | Attending: Urology | Admitting: Urology

## 2023-10-26 DIAGNOSIS — N201 Calculus of ureter: Secondary | ICD-10-CM | POA: Insufficient documentation

## 2023-10-29 NOTE — Progress Notes (Signed)
 10/30/2023 8:10 PM   Albert Tran [birth year] [id number]  Referring provider: Judy Pimple, MD [address] [place],  [place] [address]  Urological history: 1.  Nephrolithiasis -CT renal stone study (09/2023) distal left 5 mm ureteral stone -aborted ESWL (09/2023) stone not visible on plain film  Chief Complaint  Patient presents with   Other   HPI: Albert Tran is a 58 y.o. male who presents today for CT report.    Previous records reviewed.   Pelvic CT (10/2023) -left distal ureteral stone still present  He has had issues with lower left back discomfort, urinary urgency and frequency.  He is taking tamsulosin 0.4 mg daily.  Patient denies any modifying or aggravating factors.  Patient denies any recent UTI's, gross hematuria, dysuria or suprapubic/flank pain.  Patient denies any fevers, chills, nausea or vomiting.    UA yellow clear, specific gravity 1.025, pH 6.0, 0-5 WBCs, 0-2 RBCs, 0-10 epithelial cells and a few bacteria.  PMH: Past Medical History:  Diagnosis Date   Carpal tunnel syndrome    DDD (degenerative disc disease)    Family history of adverse reaction to anesthesia    Nephew had malignant hypothermia.   Fatty liver    Gallstones    Headache(784.0)    likely migraines   Hypertension    Hypertriglyceridemia    Nosebleed    Hx of   Open fracture of ankle 06/09/2010   Overview:  Overview:  Qualifier: Diagnosis of  By: Ernest Mallick LPN, Rena   Last Assessment & Plan:  Nl exam and many features of migraine  Waking up with them Ref to pulm to eval for sleep apnea -also loud snoring and fatigue  Then re eval Adv to take imitrex early in ha  Phenergan for nausea prn    Plantar fasciitis    Vertigo, intermittent     Surgical History: Past Surgical History:  Procedure Laterality Date   COLONOSCOPY WITH PROPOFOL N/A 07/13/2020   Procedure: COLONOSCOPY WITH PROPOFOL;  Surgeon: Midge Minium, MD;  Location: [facility] ENDOSCOPY;  Service: Endoscopy;   Laterality: N/A;   EXTRACORPOREAL SHOCK WAVE LITHOTRIPSY Left 10/04/2023   Procedure: EXTRACORPOREAL SHOCK WAVE LITHOTRIPSY (ESWL);  Surgeon: Riki Altes, MD;  Location: [facility] ORS;  Service: Urology;  Laterality: Left;   HERNIA REPAIR     as a teen   KNEE ARTHROSCOPY     LAMINECTOMY     LS disc sx   robot assisted laprascopy with colectomy   01/23/2019   sigmoid colectomy from diverticulitis   TONSILLECTOMY      Home Medications:  Allergies as of 10/30/2023       Reactions   Pollen Extract         Medication List        Accurate as of October 30, 2023 11:59 PM. If you have any questions, ask your nurse or doctor.          STOP taking these medications    ondansetron 4 MG disintegrating tablet Commonly known as: ZOFRAN-ODT   oxyCODONE-acetaminophen 5-325 MG tablet Commonly known as: Percocet       TAKE these medications    amLODipine 5 MG tablet Commonly known as: NORVASC Take 5 mg by mouth daily as needed (bp over 150/90).   FISH OIL PO Take 1 capsule by mouth daily.   lisinopril-hydrochlorothiazide 20-25 MG tablet Commonly known as: ZESTORETIC TAKE 1 TABLET BY MOUTH DAILY   Multi-Vitamin tablet Take by mouth.  rosuvastatin 10 MG tablet Commonly known as: Crestor Take 1 tablet (10 mg total) by mouth daily.   tamsulosin 0.4 MG Caps capsule Commonly known as: FLOMAX Take 1 capsule (0.4 mg total) by mouth daily after supper.   tamsulosin 0.4 MG Caps capsule Commonly known as: FLOMAX Take 1 capsule (0.4 mg total) by mouth daily.        Allergies:  Allergies  Allergen Reactions   Pollen Extract     Family History: Family History  Problem Relation Age of Onset   Heart disease Mother        CAD   Alcohol abuse Father    Diabetes Father    Heart disease Father        CAD   Nephrolithiasis Father    Obesity Brother    Heart attack Brother 68   Cancer Paternal Uncle        bladder CA   Heart disease Sister 68       CAD with 4  stents    Social History:  reports that he has quit smoking. He has quit using smokeless tobacco.  His smokeless tobacco use included chew. He reports that he does not drink alcohol and does not use drugs.  ROS: Pertinent ROS in HPI  Physical Exam: BP (!) 154/89   Pulse 73   Ht 5\' 5"  (1.651 m)   Wt 190 lb (86.2 kg)   BMI 31.62 kg/m   Constitutional:  Well nourished. Alert and oriented, No acute distress. HEENT: [place] AT, moist mucus membranes.  Trachea midline Cardiovascular: No clubbing, cyanosis, or edema. Respiratory: Normal respiratory effort, no increased work of breathing. Neurologic: Grossly intact, no focal deficits, moving all 4 extremities. Psychiatric: Normal mood and affect.  Laboratory Data: Lab Results  Component Value Date   WBC 15.7 (H) 09/02/2023   HGB 14.3 09/02/2023   HCT 40.8 09/02/2023   MCV 85.5 09/02/2023   PLT 257 09/02/2023   Lab Results  Component Value Date   CREATININE 1.14 09/02/2023   Lab Results  Component Value Date   PSA 0.23 01/15/2023   Lab Results  Component Value Date   HGBA1C 5.9 01/15/2023   Lab Results  Component Value Date   TSH 3.82 01/15/2023      Component Value Date/Time   CHOL 155 01/08/2023 0744   HDL 44.60 01/08/2023 0744   CHOLHDL 3 01/08/2023 0744   VLDL 17.0 01/08/2023 0744   LDLCALC 94 01/08/2023 0744   Lab Results  Component Value Date   AST 35 09/02/2023   Lab Results  Component Value Date   ALT 31 09/02/2023   Urinalysis See EPIC and HPI  I have reviewed the labs.   Pertinent Imaging: Pelvic CT left ureteral stone present, radiologist interpretation still pending I have independently reviewed the films.  See films.   Assessment & Plan:    1. Left ureteral stone - schedule left ureteroscopy with laser lithotripsy and ureteral stent placement - explained to the patient how the procedure is performed and the risks involved -informed the patient that they will have an ureteral stent, which  will remain in place for approximately 3-10 days and can be associated with flank pain, bladder pain, dysuria, urgency, frequency, urinary leakage, and gross hematuria. - stent may be removed in the office with a cystoscope or patient may be instructed to remove the stent themselves by the string and that is decided on the day of the procedure - residual stones within the kidney or  ureter may be present after the procedure and may need to have these addressed at a different encounter - injury to the ureter is the most common intra-operative risk, it may result in an open procedure to correct the defect - infection and bleeding are also risks - explained the risks of general anesthesia, such as: MI, CVA, paralysis, coma and/or death. - advised to contact our office or seek treatment in the ED if becomes febrile or pain/ vomiting are difficult control in order to arrange for emergent/urgent intervention -UA benign -urine culture   2. Microscopic hematuria -UA w/o micro heme    Return for Left URS/LL/ureteral stent placement.  These notes generated with voice recognition software. I apologize for typographical errors.  Cloretta Ned  [facility] [address]  [address] [place], [place] [address] [phone]  Surgical Physician Order Form [facility]  * Scheduling expectation :  He would like the procedure the week of 03/11 would like Friday if possible, but he is a Dr. Apolinar Junes or Dr. Lonna Cobb patient   *Length of Case:   *Clearance needed: no  *Anticoagulation Instructions: N/A  *Aspirin Instructions: N/A  *Post-op visit Date/Instructions:   TBD  *Diagnosis: Left Ureteral Stone  *Procedure: left Ureteroscopy w/laser lithotripsy & stent placement (43329)   Additional orders: N/A  -Admit type: OUTpatient  -Anesthesia: General  -VTE Prophylaxis Standing Order SCD's       Other:   -Standing Lab Orders Per Anesthesia     Lab other: None  -Standing Test orders EKG/Chest x-ray per Anesthesia       Test other:   - Medications:  Ancef 2gm IV  -Other orders:  N/A

## 2023-10-29 NOTE — H&P (View-Only) (Signed)
 10/30/2023 8:10 PM   Albert Tran [birth year] [id number]  Referring provider: Judy Pimple, MD [address] [place],  [place] [address]  Urological history: 1.  Nephrolithiasis -CT renal stone study (09/2023) distal left 5 mm ureteral stone -aborted ESWL (09/2023) stone not visible on plain film  Chief Complaint  Patient presents with   Other   HPI: Albert Tran is a 58 y.o. male who presents today for CT report.    Previous records reviewed.   Pelvic CT (10/2023) -left distal ureteral stone still present  He has had issues with lower left back discomfort, urinary urgency and frequency.  He is taking tamsulosin 0.4 mg daily.  Patient denies any modifying or aggravating factors.  Patient denies any recent UTI's, gross hematuria, dysuria or suprapubic/flank pain.  Patient denies any fevers, chills, nausea or vomiting.    UA yellow clear, specific gravity 1.025, pH 6.0, 0-5 WBCs, 0-2 RBCs, 0-10 epithelial cells and a few bacteria.  PMH: Past Medical History:  Diagnosis Date   Carpal tunnel syndrome    DDD (degenerative disc disease)    Family history of adverse reaction to anesthesia    Nephew had malignant hypothermia.   Fatty liver    Gallstones    Headache(784.0)    likely migraines   Hypertension    Hypertriglyceridemia    Nosebleed    Hx of   Open fracture of ankle 06/09/2010   Overview:  Overview:  Qualifier: Diagnosis of  By: Ernest Mallick LPN, Rena   Last Assessment & Plan:  Nl exam and many features of migraine  Waking up with them Ref to pulm to eval for sleep apnea -also loud snoring and fatigue  Then re eval Adv to take imitrex early in ha  Phenergan for nausea prn    Plantar fasciitis    Vertigo, intermittent     Surgical History: Past Surgical History:  Procedure Laterality Date   COLONOSCOPY WITH PROPOFOL N/A 07/13/2020   Procedure: COLONOSCOPY WITH PROPOFOL;  Surgeon: Midge Minium, MD;  Location: [facility] ENDOSCOPY;  Service: Endoscopy;   Laterality: N/A;   EXTRACORPOREAL SHOCK WAVE LITHOTRIPSY Left 10/04/2023   Procedure: EXTRACORPOREAL SHOCK WAVE LITHOTRIPSY (ESWL);  Surgeon: Riki Altes, MD;  Location: [facility] ORS;  Service: Urology;  Laterality: Left;   HERNIA REPAIR     as a teen   KNEE ARTHROSCOPY     LAMINECTOMY     LS disc sx   robot assisted laprascopy with colectomy   01/23/2019   sigmoid colectomy from diverticulitis   TONSILLECTOMY      Home Medications:  Allergies as of 10/30/2023       Reactions   Pollen Extract         Medication List        Accurate as of October 30, 2023 11:59 PM. If you have any questions, ask your nurse or doctor.          STOP taking these medications    ondansetron 4 MG disintegrating tablet Commonly known as: ZOFRAN-ODT   oxyCODONE-acetaminophen 5-325 MG tablet Commonly known as: Percocet       TAKE these medications    amLODipine 5 MG tablet Commonly known as: NORVASC Take 5 mg by mouth daily as needed (bp over 150/90).   FISH OIL PO Take 1 capsule by mouth daily.   lisinopril-hydrochlorothiazide 20-25 MG tablet Commonly known as: ZESTORETIC TAKE 1 TABLET BY MOUTH DAILY   Multi-Vitamin tablet Take by mouth.  rosuvastatin 10 MG tablet Commonly known as: Crestor Take 1 tablet (10 mg total) by mouth daily.   tamsulosin 0.4 MG Caps capsule Commonly known as: FLOMAX Take 1 capsule (0.4 mg total) by mouth daily after supper.   tamsulosin 0.4 MG Caps capsule Commonly known as: FLOMAX Take 1 capsule (0.4 mg total) by mouth daily.        Allergies:  Allergies  Allergen Reactions   Pollen Extract     Family History: Family History  Problem Relation Age of Onset   Heart disease Mother        CAD   Alcohol abuse Father    Diabetes Father    Heart disease Father        CAD   Nephrolithiasis Father    Obesity Brother    Heart attack Brother 68   Cancer Paternal Uncle        bladder CA   Heart disease Sister 68       CAD with 4  stents    Social History:  reports that he has quit smoking. He has quit using smokeless tobacco.  His smokeless tobacco use included chew. He reports that he does not drink alcohol and does not use drugs.  ROS: Pertinent ROS in HPI  Physical Exam: BP (!) 154/89   Pulse 73   Ht 5\' 5"  (1.651 m)   Wt 190 lb (86.2 kg)   BMI 31.62 kg/m   Constitutional:  Well nourished. Alert and oriented, No acute distress. HEENT: [place] AT, moist mucus membranes.  Trachea midline Cardiovascular: No clubbing, cyanosis, or edema. Respiratory: Normal respiratory effort, no increased work of breathing. Neurologic: Grossly intact, no focal deficits, moving all 4 extremities. Psychiatric: Normal mood and affect.  Laboratory Data: Lab Results  Component Value Date   WBC 15.7 (H) 09/02/2023   HGB 14.3 09/02/2023   HCT 40.8 09/02/2023   MCV 85.5 09/02/2023   PLT 257 09/02/2023   Lab Results  Component Value Date   CREATININE 1.14 09/02/2023   Lab Results  Component Value Date   PSA 0.23 01/15/2023   Lab Results  Component Value Date   HGBA1C 5.9 01/15/2023   Lab Results  Component Value Date   TSH 3.82 01/15/2023      Component Value Date/Time   CHOL 155 01/08/2023 0744   HDL 44.60 01/08/2023 0744   CHOLHDL 3 01/08/2023 0744   VLDL 17.0 01/08/2023 0744   LDLCALC 94 01/08/2023 0744   Lab Results  Component Value Date   AST 35 09/02/2023   Lab Results  Component Value Date   ALT 31 09/02/2023   Urinalysis See EPIC and HPI  I have reviewed the labs.   Pertinent Imaging: Pelvic CT left ureteral stone present, radiologist interpretation still pending I have independently reviewed the films.  See films.   Assessment & Plan:    1. Left ureteral stone - schedule left ureteroscopy with laser lithotripsy and ureteral stent placement - explained to the patient how the procedure is performed and the risks involved -informed the patient that they will have an ureteral stent, which  will remain in place for approximately 3-10 days and can be associated with flank pain, bladder pain, dysuria, urgency, frequency, urinary leakage, and gross hematuria. - stent may be removed in the office with a cystoscope or patient may be instructed to remove the stent themselves by the string and that is decided on the day of the procedure - residual stones within the kidney or  ureter may be present after the procedure and may need to have these addressed at a different encounter - injury to the ureter is the most common intra-operative risk, it may result in an open procedure to correct the defect - infection and bleeding are also risks - explained the risks of general anesthesia, such as: MI, CVA, paralysis, coma and/or death. - advised to contact our office or seek treatment in the ED if becomes febrile or pain/ vomiting are difficult control in order to arrange for emergent/urgent intervention -UA benign -urine culture   2. Microscopic hematuria -UA w/o micro heme    Return for Left URS/LL/ureteral stent placement.  These notes generated with voice recognition software. I apologize for typographical errors.  Cloretta Ned  [facility] [address]  [address] [place], [place] [address] [phone]  Surgical Physician Order Form [facility]  * Scheduling expectation :  He would like the procedure the week of 03/11 would like Friday if possible, but he is a Dr. Apolinar Junes or Dr. Lonna Cobb patient   *Length of Case:   *Clearance needed: no  *Anticoagulation Instructions: N/A  *Aspirin Instructions: N/A  *Post-op visit Date/Instructions:   TBD  *Diagnosis: Left Ureteral Stone  *Procedure: left Ureteroscopy w/laser lithotripsy & stent placement (43329)   Additional orders: N/A  -Admit type: OUTpatient  -Anesthesia: General  -VTE Prophylaxis Standing Order SCD's       Other:   -Standing Lab Orders Per Anesthesia     Lab other: None  -Standing Test orders EKG/Chest x-ray per Anesthesia       Test other:   - Medications:  Ancef 2gm IV  -Other orders:  N/A

## 2023-10-30 ENCOUNTER — Ambulatory Visit: Payer: Commercial Managed Care - PPO | Admitting: Urology

## 2023-10-30 VITALS — BP 154/89 | HR 73 | Ht 65.0 in | Wt 190.0 lb

## 2023-10-30 DIAGNOSIS — R3129 Other microscopic hematuria: Secondary | ICD-10-CM | POA: Diagnosis not present

## 2023-10-30 DIAGNOSIS — N201 Calculus of ureter: Secondary | ICD-10-CM

## 2023-10-30 LAB — URINALYSIS, COMPLETE
Bilirubin, UA: NEGATIVE
Glucose, UA: NEGATIVE
Ketones, UA: NEGATIVE
Leukocytes,UA: NEGATIVE
Nitrite, UA: NEGATIVE
Protein,UA: NEGATIVE
RBC, UA: NEGATIVE
Specific Gravity, UA: 1.025 (ref 1.005–1.030)
Urobilinogen, Ur: 0.2 mg/dL (ref 0.2–1.0)
pH, UA: 6 (ref 5.0–7.5)

## 2023-10-30 LAB — MICROSCOPIC EXAMINATION

## 2023-10-31 ENCOUNTER — Other Ambulatory Visit: Payer: Self-pay

## 2023-10-31 DIAGNOSIS — N201 Calculus of ureter: Secondary | ICD-10-CM

## 2023-10-31 NOTE — Progress Notes (Unsigned)
 Surgical Physician Order Form [facility] [place]  * Scheduling expectation :  He would like the procedure the week of 03/11 would like Friday if possible, but he is a Dr. Apolinar Junes or Dr. Lonna Cobb patient   *Length of Case:   *Clearance needed: no  *Anticoagulation Instructions: N/A  *Aspirin Instructions: N/A  *Post-op visit Date/Instructions:   TBD  *Diagnosis: Left Ureteral Stone  *Procedure: left Ureteroscopy w/laser lithotripsy & stent placement ([address])   Additional orders: N/A  -Admit type: OUTpatient  -Anesthesia: General  -VTE Prophylaxis Standing Order SCD's       Other:   -Standing Lab Orders Per Anesthesia    Lab other: None  -Standing Test orders EKG/Chest x-ray per Anesthesia       Test other:   - Medications:  Ancef 2gm IV  -Other orders:  N/A

## 2023-11-01 ENCOUNTER — Telehealth: Payer: Self-pay

## 2023-11-01 NOTE — Telephone Encounter (Signed)
  Per Dr. Apolinar Junes, Patient is to be scheduled for Left Ureteroscopy with Laser Lithotripsy and Stent Placement   Mr. Walla was contacted and possible surgical dates were discussed, Monday March 10th, 2025 was agreed upon for surgery.   Patient was directed to call [phone] between 1-3pm the day before surgery to find out surgical arrival time.  Instructions were given not to eat or drink from midnight on the night before surgery and have a driver for the day of surgery. On the surgery day patient was instructed to enter through the Medical Mall entrance of [facility] report the Same Day Surgery desk.   Pre-Admit Testing will be in contact via phone to set up an interview with the anesthesia team to review your history and medications prior to surgery.   Reminder of this information was sent via MyChart to the patient.

## 2023-11-01 NOTE — Progress Notes (Addendum)
   [facility] Surgical Posting Form  Surgery Date: Date: 11/16/2023  Surgeon: Dr. Legrand Rams, MD  Inpt ( No  )   Outpt (Yes)   Obs ( No  )   Diagnosis: N20.1 Left Ureteral Stone  -CPT: [phone]  Surgery: Left Ureteroscopy with Laser Lithotripsy and Stent Placement  Stop Anticoagulations: No  Cardiac/Medical/Pulmonary Clearance needed: no  *Orders entered into EPIC  Date: 11/01/23   *Case booked in [place]  Date: 10/31/2023  *Notified pt of Surgery: Date: 10/31/2023  PRE-OP UA & CX: No  *Placed into Prior Authorization Work Angela Nevin Date: 11/01/23  Assistant/laser/rep:No

## 2023-11-02 LAB — CULTURE, URINE COMPREHENSIVE

## 2023-11-04 ENCOUNTER — Encounter: Payer: Self-pay | Admitting: Urology

## 2023-11-06 ENCOUNTER — Inpatient Hospital Stay: Admission: RE | Admit: 2023-11-06 | Payer: Commercial Managed Care - PPO | Source: Ambulatory Visit

## 2023-11-06 NOTE — Progress Notes (Signed)
 Patient called in and advised that he would no longer be able to have his surgery on 3/10 due to his ride not being available. Spoke with Dr. Apolinar Junes and Dr. Richardo Hanks, Dr. Richardo Hanks able to accommodate patient for his URS on Friday 11/16/2023 that aligned with patients schedule. Patient very appreciative that Urology was able to work with him.

## 2023-11-06 NOTE — Addendum Note (Signed)
 Addended by: Letta Kocher A on: 11/06/2023 12:17 PM   Modules accepted: Orders

## 2023-11-07 ENCOUNTER — Encounter
Admission: RE | Admit: 2023-11-07 | Discharge: 2023-11-07 | Disposition: A | Discharge status: Home / Self Care | Source: Ambulatory Visit | Attending: Urology | Admitting: Urology

## 2023-11-07 ENCOUNTER — Other Ambulatory Visit: Payer: Self-pay

## 2023-11-07 VITALS — Ht 65.0 in | Wt 190.0 lb

## 2023-11-07 DIAGNOSIS — I10 Essential (primary) hypertension: Secondary | ICD-10-CM

## 2023-11-07 DIAGNOSIS — Z01810 Encounter for preprocedural cardiovascular examination: Secondary | ICD-10-CM

## 2023-11-07 NOTE — Patient Instructions (Addendum)
 Your procedure is scheduled on:  Friday March 14  Report to the Registration Desk on the 1st floor of the [facility] To find out your arrival time, please call [phone] between 1PM - 3PM on: Thursday March 13  If your arrival time is 6:00 am, do not arrive before that time as the Medical Mall entrance doors do not open until 6:00 am.  REMEMBER: Instructions that are not followed completely may result in serious medical risk, up to and including death; or upon the discretion of your surgeon and anesthesiologist your surgery may need to be rescheduled.  Do not eat food after midnight the night before surgery.  No gum chewing or hard candies.   One week prior to surgery: Stop Anti-inflammatories (NSAIDS) such as Advil, Aleve, Ibuprofen, Motrin, Naproxen, Naprosyn and Aspirin based products such as Excedrin, Goody's Powder, BC Powder. Stop ANY OVER THE COUNTER supplements until after surgery. Multiple Vitamin (MULTI-VITAMIN)  Omega-3 Fatty Acids (FISH OIL PO)  You may however, continue to take Tylenol if needed for pain up until the day of surgery.  Continue taking all of your other prescription medications up until the day of surgery.  ON THE DAY OF SURGERY DO NOT TAKE ANY MEDICATIONS.   No Alcohol for 24 hours before or after surgery.  No Smoking including e-cigarettes for 24 hours before surgery.  No chewable tobacco products for at least 6 hours before surgery.  No nicotine patches on the day of surgery.  Do not use any "recreational" drugs for at least a week (preferably 2 weeks) before your surgery.  Please be advised that the combination of cocaine and anesthesia may have negative outcomes, up to and including death. If you test positive for cocaine, your surgery will be cancelled.  On the morning of surgery brush your teeth with toothpaste and water, you may rinse your mouth with mouthwash if you wish. Do not swallow any toothpaste or mouthwash.  Do not wear jewelry,  make-up, hairpins, clips or nail polish.  For welded (permanent) jewelry: bracelets, anklets, waist bands, etc.  Please have this removed prior to surgery.  If it is not removed, there is a chance that hospital personnel will need to cut it off on the day of surgery.  Do not wear lotions, powders, or perfumes.   Do not shave body hair from the neck down 48 hours before surgery.  Contact lenses, hearing aids and dentures may not be worn into surgery.  Do not bring valuables to the hospital. [facility] is not responsible for any missing/lost belongings or valuables.   Notify your doctor if there is any change in your medical condition (cold, fever, infection).  Wear comfortable clothing (specific to your surgery type) to the hospital.  After surgery, you can help prevent lung complications by doing breathing exercises.  Take deep breaths and cough every 1-2 hours. Y If you are being discharged the day of surgery, you will not be allowed to drive home. You will need a responsible individual to drive you home and stay with you for 24 hours after surgery.   If you are taking public transportation, you will need to have a responsible individual with you.  Please call the Pre-admissions Testing Dept. at [phone] if you have any questions about these instructions.  Surgery Visitation Policy:  Patients having surgery or a procedure may have two visitors.  Children under the age of 60 must have an adult with them who is not the patient.  Temporary Visitor Restrictions Due to increasing cases of flu, RSV and COVID-19: Children ages 46 and under will not be able to visit patients in [facility] hospitals under most circumstances.

## 2023-11-09 ENCOUNTER — Encounter: Payer: Self-pay | Admitting: Urgent Care

## 2023-11-09 ENCOUNTER — Encounter
Admission: RE | Admit: 2023-11-09 | Discharge: 2023-11-09 | Disposition: A | Discharge status: Home / Self Care | Source: Ambulatory Visit | Attending: Urology | Admitting: Urology

## 2023-11-09 DIAGNOSIS — I10 Essential (primary) hypertension: Secondary | ICD-10-CM | POA: Insufficient documentation

## 2023-11-09 DIAGNOSIS — Z01810 Encounter for preprocedural cardiovascular examination: Secondary | ICD-10-CM | POA: Insufficient documentation

## 2023-11-09 HISTORY — DX: Other complications of anesthesia, initial encounter: T88.59XA

## 2023-11-09 NOTE — Progress Notes (Signed)
  Perioperative Services Pre-Admission/Anesthesia Testing    Date: 11/09/23  Name: Albert Tran MRN:   [id number]  Re: History of familial MALIGNANT HYPERTHERMIA   Planned Surgical Procedure(s):     Case: [id number] Date/Time: 11/16/23 1309   Procedure: CYSTOSCOPY/URETEROSCOPY/HOLMIUM LASER/STENT PLACEMENT (Left)   Anesthesia type: General   Pre-op diagnosis: Left Ureteral Stone   Location: [facility] OR ROOM 10 / [facility]   Surgeons: Sondra Come, MD      Clinical Notes:  Patient scheduled for the above procedure on 11/16/2023 with Dr. Legrand Rams, MD. During his PAT interview, it was noted that patient has a (+) familial history of MALIGNANT HYPERTHERMIA in a 2nd degree relative (nephew). Patient indicates that, to his knowledge, nephew has never had confirmatory muscle biopsy for caffeine halothane contracture testing (CHCT) to confirm diagnosis.  Patient [name]) has never personally experienced any issues related to the use of general anesthesia. Received sevoflurane, ketamine, propofol, rocuronium, and fentanyl for sigmoid colectomy here in 01/2020.   In efforts to ensure procedural safety during anesthesia administration, this information was added to patient's medical history for review by the patient's surgical/anesthesia team.  Additionally, necessary OR administrative staff has been made aware so that any appropriate/necessary precautions can be taken prior to the patient arriving to campus for his  planned surgical procedure.  Staff notified: []  Mena Goes, Mudlogger - OR) [x]  Stormy Fabian, CRNA (Lead CRNA) [x]  Gates Rigg, RN Engineer, manufacturing systems - OR)  Quentin Mulling, MSN, APRN, FNP-C, CEN [facility]  Perioperative Services Nurse Practitioner Phone: [phone] 11/09/23 12:33 PM  NOTE: This note has been prepared using Dragon dictation software. Despite my best ability to proofread, there is always the  potential that unintentional transcriptional errors may still occur from this process.

## 2023-11-15 MED ORDER — CHLORHEXIDINE GLUCONATE 0.12 % MT SOLN: 15.0000 mL | Freq: Once | OROMUCOSAL | Status: DC

## 2023-11-15 MED ORDER — ORAL CARE MOUTH RINSE: 15.0000 mL | Freq: Once | OROMUCOSAL | Status: DC

## 2023-11-15 MED ORDER — CEFAZOLIN SODIUM-DEXTROSE 2-4 GM/100ML-% IV SOLN
2.0000 g | INTRAVENOUS | Status: AC
Start: 2023-11-15 — End: 2023-11-16
  Administered 2023-11-16: 2 g via INTRAVENOUS

## 2023-11-15 MED ORDER — LACTATED RINGERS IV SOLN: INTRAVENOUS | Status: DC

## 2023-11-16 ENCOUNTER — Ambulatory Visit

## 2023-11-16 ENCOUNTER — Ambulatory Visit
Admission: RE | Admit: 2023-11-16 | Discharge: 2023-11-16 | Disposition: A | Discharge status: Home / Self Care | Payer: Commercial Managed Care - PPO | Source: Ambulatory Visit | Attending: Urology | Admitting: Urology

## 2023-11-16 ENCOUNTER — Encounter: Payer: Self-pay | Admitting: Urology

## 2023-11-16 ENCOUNTER — Encounter
Admission: RE | Disposition: A | Discharge status: Home / Self Care | Payer: Self-pay | Source: Ambulatory Visit | Attending: Urology

## 2023-11-16 ENCOUNTER — Ambulatory Visit: Payer: Self-pay | Admitting: Urgent Care

## 2023-11-16 ENCOUNTER — Other Ambulatory Visit: Payer: Self-pay

## 2023-11-16 DIAGNOSIS — C672 Malignant neoplasm of lateral wall of bladder: Secondary | ICD-10-CM | POA: Insufficient documentation

## 2023-11-16 DIAGNOSIS — I10 Essential (primary) hypertension: Secondary | ICD-10-CM | POA: Diagnosis not present

## 2023-11-16 DIAGNOSIS — R3915 Urgency of urination: Secondary | ICD-10-CM | POA: Diagnosis not present

## 2023-11-16 DIAGNOSIS — C676 Malignant neoplasm of ureteric orifice: Secondary | ICD-10-CM

## 2023-11-16 DIAGNOSIS — N201 Calculus of ureter: Secondary | ICD-10-CM | POA: Diagnosis present

## 2023-11-16 DIAGNOSIS — R519 Headache, unspecified: Secondary | ICD-10-CM | POA: Insufficient documentation

## 2023-11-16 DIAGNOSIS — Z87891 Personal history of nicotine dependence: Secondary | ICD-10-CM | POA: Insufficient documentation

## 2023-11-16 DIAGNOSIS — Z79899 Other long term (current) drug therapy: Secondary | ICD-10-CM | POA: Diagnosis not present

## 2023-11-16 DIAGNOSIS — R350 Frequency of micturition: Secondary | ICD-10-CM | POA: Diagnosis not present

## 2023-11-16 HISTORY — PX: CYSTOSCOPY/URETEROSCOPY/HOLMIUM LASER/STENT PLACEMENT: SHX6546

## 2023-11-16 HISTORY — PX: CYSTOSCOPY W/ RETROGRADES: SHX1426

## 2023-11-16 SURGERY — CYSTOSCOPY/URETEROSCOPY/HOLMIUM LASER/STENT PLACEMENT
Anesthesia: General | Site: Ureter | Laterality: Left

## 2023-11-16 MED ORDER — PROPOFOL 10 MG/ML IV BOLUS
INTRAVENOUS | Status: AC
  Filled 2023-11-16: qty 20

## 2023-11-16 MED ORDER — LIDOCAINE HCL (CARDIAC) PF 100 MG/5ML IV SOSY
PREFILLED_SYRINGE | INTRAVENOUS | Status: DC | PRN
  Administered 2023-11-16: 100 mg via INTRAVENOUS

## 2023-11-16 MED ORDER — OXYCODONE HCL 5 MG/5ML PO SOLN: 5.0000 mg | Freq: Once | ORAL | Status: DC | PRN

## 2023-11-16 MED ORDER — MIDAZOLAM HCL 5 MG/5ML IJ SOLN
INTRAMUSCULAR | Status: DC | PRN
  Administered 2023-11-16: 2 mg via INTRAVENOUS

## 2023-11-16 MED ORDER — ONDANSETRON HCL 4 MG/2ML IJ SOLN
INTRAMUSCULAR | Status: DC | PRN
  Administered 2023-11-16: 4 mg via INTRAVENOUS

## 2023-11-16 MED ORDER — PROPOFOL 10 MG/ML IV BOLUS
INTRAVENOUS | Status: DC | PRN
  Administered 2023-11-16: 100 mg via INTRAVENOUS

## 2023-11-16 MED ORDER — CHLORHEXIDINE GLUCONATE 0.12 % MT SOLN
OROMUCOSAL | Status: AC
  Filled 2023-11-16: qty 15

## 2023-11-16 MED ORDER — CEFAZOLIN SODIUM-DEXTROSE 2-4 GM/100ML-% IV SOLN
INTRAVENOUS | Status: AC
  Filled 2023-11-16: qty 100

## 2023-11-16 MED ORDER — KETOROLAC TROMETHAMINE 30 MG/ML IJ SOLN
INTRAMUSCULAR | Status: DC | PRN
  Administered 2023-11-16: 15 mg via INTRAVENOUS

## 2023-11-16 MED ORDER — OXYCODONE HCL 5 MG PO TABS: 5.0000 mg | ORAL_TABLET | Freq: Once | ORAL | Status: DC | PRN

## 2023-11-16 MED ORDER — SUGAMMADEX SODIUM 200 MG/2ML IV SOLN
INTRAVENOUS | Status: DC | PRN
  Administered 2023-11-16: 200 mg via INTRAVENOUS

## 2023-11-16 MED ORDER — DEXAMETHASONE SODIUM PHOSPHATE 10 MG/ML IJ SOLN
INTRAMUSCULAR | Status: DC | PRN
  Administered 2023-11-16: 10 mg via INTRAVENOUS

## 2023-11-16 MED ORDER — PROPOFOL 500 MG/50ML IV EMUL
INTRAVENOUS | Status: DC | PRN
  Administered 2023-11-16: 150 ug/kg/min via INTRAVENOUS

## 2023-11-16 MED ORDER — SODIUM CHLORIDE 0.9 % IR SOLN
Status: DC | PRN
  Administered 2023-11-16: 3000 mL via INTRAVESICAL

## 2023-11-16 MED ORDER — ROCURONIUM BROMIDE 100 MG/10ML IV SOLN
INTRAVENOUS | Status: DC | PRN
  Administered 2023-11-16: 50 mg via INTRAVENOUS

## 2023-11-16 MED ORDER — IOHEXOL 180 MG/ML  SOLN
INTRAMUSCULAR | Status: DC | PRN
  Administered 2023-11-16: 5 mL

## 2023-11-16 MED ORDER — MIDAZOLAM HCL 2 MG/2ML IJ SOLN
INTRAMUSCULAR | Status: AC
  Filled 2023-11-16: qty 2

## 2023-11-16 MED ORDER — FENTANYL CITRATE (PF) 100 MCG/2ML IJ SOLN
INTRAMUSCULAR | Status: AC
  Filled 2023-11-16: qty 2

## 2023-11-16 MED ORDER — FENTANYL CITRATE (PF) 100 MCG/2ML IJ SOLN: 25.0000 ug | INTRAMUSCULAR | Status: DC | PRN

## 2023-11-16 MED ORDER — FENTANYL CITRATE (PF) 100 MCG/2ML IJ SOLN
INTRAMUSCULAR | Status: DC | PRN
  Administered 2023-11-16 (×2): 50 ug via INTRAVENOUS

## 2023-11-16 SURGICAL SUPPLY — 29 items
ADHESIVE MASTISOL STRL (MISCELLANEOUS) IMPLANT
BAG DRAIN SIEMENS DORNER NS (MISCELLANEOUS) ×3 IMPLANT
BAG PRESSURE INF REUSE 3000 (BAG) ×3 IMPLANT
BRUSH SCRUB EZ 1% IODOPHOR (MISCELLANEOUS) ×3 IMPLANT
CATH URET FLEX-TIP 2 LUMEN 10F (CATHETERS) IMPLANT
CATH URETL OPEN 5X70 (CATHETERS) IMPLANT
CNTNR URN SCR LID CUP LEK RST (MISCELLANEOUS) IMPLANT
DRAPE UTILITY 15X26 TOWEL STRL (DRAPES) ×3 IMPLANT
DRSG TEGADERM 2-3/8X2-3/4 SM (GAUZE/BANDAGES/DRESSINGS) IMPLANT
ELECT LOOP 22F BIPOLAR SML (ELECTROSURGICAL) ×2 IMPLANT
ELECTRODE LOOP 22F BIPOLAR SML (ELECTROSURGICAL) IMPLANT
FIBER LASER MOSES 200 DFL (Laser) IMPLANT
FIBER LASER MOSES 365 DFL (Laser) IMPLANT
GLOVE BIOGEL PI IND STRL 7.5 (GLOVE) ×3 IMPLANT
GOWN STRL REUS W/ TWL LRG LVL3 (GOWN DISPOSABLE) ×3 IMPLANT
GOWN STRL REUS W/ TWL XL LVL3 (GOWN DISPOSABLE) ×3 IMPLANT
GUIDEWIRE STR DUAL SENSOR (WIRE) ×3 IMPLANT
KIT TURNOVER CYSTO (KITS) ×3 IMPLANT
PACK CYSTO AR (MISCELLANEOUS) ×3 IMPLANT
SET CYSTO W/LG BORE CLAMP LF (SET/KITS/TRAYS/PACK) ×3 IMPLANT
SET IRRIG Y TYPE TUR BLADDER L (SET/KITS/TRAYS/PACK) IMPLANT
SHEATH NAVIGATOR HD 12/14X36 (SHEATH) IMPLANT
SOL .9 NS 3000ML IRR UROMATIC (IV SOLUTION) ×3 IMPLANT
STENT URET 6FRX24 CONTOUR (STENTS) IMPLANT
STENT URET 6FRX26 CONTOUR (STENTS) IMPLANT
SURGILUBE 2OZ TUBE FLIPTOP (MISCELLANEOUS) ×3 IMPLANT
SYR 10ML LL (SYRINGE) ×3 IMPLANT
VALVE UROSEAL ADJ ENDO (VALVE) IMPLANT
WATER STERILE IRR 500ML POUR (IV SOLUTION) ×3 IMPLANT

## 2023-11-16 NOTE — Anesthesia Preprocedure Evaluation (Addendum)
 Anesthesia Evaluation  Patient identified by MRN, date of birth, ID band Patient awake    Reviewed: Allergy & Precautions, NPO status , Patient's Chart, lab work & pertinent test results  History of Anesthesia Complications (+) PROLONGED EMERGENCE, Family history of anesthesia reaction and history of anesthetic complications  Airway Mallampati: III  TM Distance: >3 FB Neck ROM: Full  Mouth opening: Limited Mouth Opening  Dental no notable dental hx. (+) Dental Advidsory Given, Teeth Intact   Pulmonary former smoker   Pulmonary exam normal        Cardiovascular hypertension, Normal cardiovascular exam Rhythm:Regular Rate:Normal     Neuro/Psych  Headaches  Neuromuscular disease negative neurological ROS  negative psych ROS   GI/Hepatic negative GI ROS, Neg liver ROS,,,  Endo/Other  negative endocrine ROS    Renal/GU      Musculoskeletal   Abdominal   Peds  Hematology negative hematology ROS (+)   Anesthesia Other Findings Patient's nephew has a history of malignant hyperthermia. Patient states that he has had multiple surgeries without problems. Will proceed with TIVA and MH precautions. Anesthesia tech and CRNA notified to take MH precautions with ventilator and medications.   Patient does not want to have a breathing tube. Per surgeon's request, case will be done with ETT. Patient to talk to surgeon the necessity of the ETT. Patient is worried about damage to his vocal cords. Discussed possible complications of insertion of ETT and reassurance that there would be no trauma to his vocal cords on insertion.   Past Medical History: 04/18/2022: Aortic atherosclerosis (HCC) No date: Carpal tunnel syndrome No date: Complication of anesthesia     Comment:  a.) prolonged emergence; b.) family Hx of MH No date: DDD (degenerative disc disease) No date: Family history of adverse reaction to anesthesia     Comment:  a.)  malignant hyperthermia in 2nd degree relative               (nephew) No date: Fatty liver No date: Gallstones No date: Headache(784.0)     Comment:  likely migraines 06/09/2010: Hyperlipidemia No date: Hypertension No date: Hypertriglyceridemia No date: Nosebleed 06/09/2010: Open fracture of ankle No date: Plantar fasciitis No date: Vertigo, intermittent  Past Surgical History: 07/13/2020: COLONOSCOPY WITH PROPOFOL; N/A     Comment:  Procedure: COLONOSCOPY WITH PROPOFOL;  Surgeon: Midge Minium, MD;  Location: [facility] ENDOSCOPY;  Service:               Endoscopy;  Laterality: N/A; 10/04/2023: EXTRACORPOREAL SHOCK WAVE LITHOTRIPSY; Left     Comment:  Procedure: EXTRACORPOREAL SHOCK WAVE LITHOTRIPSY (ESWL);              Surgeon: Riki Altes, MD;  Location: [facility] ORS;                Service: Urology;  Laterality: Left; No date: HERNIA REPAIR     Comment:  as a teen No date: KNEE ARTHROSCOPY No date: LAMINECTOMY     Comment:  LS disc sx 01/23/2019: robot assisted laprascopy with colectomy      Comment:  sigmoid colectomy from diverticulitis No date: TONSILLECTOMY  BMI    Body Mass Index: 31.62 kg/m      Reproductive/Obstetrics negative OB ROS                             Anesthesia Physical  Anesthesia Plan  ASA: 2  Anesthesia Plan: General ETT   Post-op Pain Management:    Induction: Intravenous  PONV Risk Score and Plan: 2 and Ondansetron, Dexamethasone, Midazolam and TIVA  Airway Management Planned: Oral ETT  Additional Equipment:   Intra-op Plan:   Post-operative Plan: Extubation in OR  Informed Consent: I have reviewed the patients History and Physical, chart, labs and discussed the procedure including the risks, benefits and alternatives for the proposed anesthesia with the patient or authorized representative who has indicated his/her understanding and acceptance.     Dental Advisory Given  Plan Discussed with:  Anesthesiologist, CRNA and Surgeon  Anesthesia Plan Comments: (Patient consented for risks of anesthesia including but not limited to:  - adverse reactions to medications - damage to eyes, teeth, lips or other oral mucosa - nerve damage due to positioning  - sore throat or hoarseness - Damage to heart, brain, nerves, lungs, other parts of body or loss of life  Patient voiced understanding and assent.)       Anesthesia Quick Evaluation

## 2023-11-16 NOTE — Interval H&P Note (Signed)
 UROLOGY H&P UPDATE  Agree with prior H&P dated 10/30/2023 by Michiel Cowboy, PA.  5 mm left distal ureteral stone, failed trial of medical expulsive therapy.  Cardiac: RRR Lungs: CTA bilaterally  Laterality: Left Procedure: Left ureteroscopy, laser lithotripsy, possible stent placement  Urine: Culture 2/25 mixed flora  We specifically discussed the risks ureteroscopy including bleeding, infection/sepsis, stent related symptoms including flank pain/urgency/frequency/incontinence/dysuria, ureteral injury, ureteral stricture, inability to access stone, or need for staged or additional procedures.   Sondra Come, MD 11/16/2023

## 2023-11-16 NOTE — Op Note (Signed)
 Date of procedure: 11/16/23  Preoperative diagnosis:  Left ureteral stone  Postoperative diagnosis:  Left ureteral stone Bladder tumor  Procedure: Cystoscopy, bladder biopsy and fulguration(1.5 cm) Left ureteroscopy, laser lithotripsy, left retrograde pyelogram with intraoperative interpretation  Surgeon: Legrand Rams, MD  Anesthesia: General  Complications: None  Intraoperative findings:  1.5 cm of papillary flat bladder tumor lateral to the left ureteral orifice, biopsied and fulgurated entirely Left ureteral stone dusted and all fragments irrigated free from the ureter, excellent efflux from left ureter no stent placed  EBL: Minimal  Specimens: Bladder tumor  Drains: None  Indication: Albert Tran is a 58 y.o. patient with 6 mm left distal ureteral stone he failed a trial of medical expulsive therapy and opted for ureteroscopy.  After reviewing the management options for treatment, they elected to proceed with the above surgical procedure(s). We have discussed the potential benefits and risks of the procedure, side effects of the proposed treatment, the likelihood of the patient achieving the goals of the procedure, and any potential problems that might occur during the procedure or recuperation. Informed consent has been obtained.  Description of procedure:  The patient was taken to the operating room and general anesthesia was induced. SCDs were placed for DVT prophylaxis. The patient was placed in the dorsal lithotomy position, prepped and draped in the usual sterile fashion, and preoperative antibiotics were administered. A preoperative time-out was performed.   A 21 French rigid cystoscope was used to intubate the urethra and normal urethra was followed proximally into the bladder.  Prostate was small to moderate in size.  Thorough cystoscopy showed approximately 1.5 cm papillary bladder tumor at the left lateral wall lateral to the left ureteral orifice.  A sensor  wire was advanced into the left ureteral orifice and passed up to the kidney under fluoroscopic vision.  A semirigid long ureteroscope was advanced alongside the wire and a 6 mm yellow stone was identified in the distal ureter.  A 365 m laser fiber on settings of 1.0 J and 10 Hz was used to methodically fragment the stone to dust.  All fragments were irrigated free from the ureter into the bladder.  Thorough ureteroscopy showed no other stone fragments or ureteral injury.  A retrograde pyelogram showed no extravasation or filling defects.  The wire was removed, and on cystoscopy there was brisk efflux of pink urine from the left ureter, and no stent was placed.  I inserted a 26 French resectoscope and a cold cup biopsy was used to remove all visual papillary tumor at the left lateral wall.  The bipolar loop was then used to fulgurate the base of the tumor extensively.  Area of tumor was approximately 1.5 cm.  With the bladder decompressed no bleeding was noted.  Intravesical gemcitabine not available in the setting of incidental finding at time of ureteroscopy for stone.  Disposition: Stable to PACU  Plan: Will call with pathology results  Legrand Rams, MD

## 2023-11-16 NOTE — Anesthesia Procedure Notes (Signed)
 Procedure Name: Intubation Date/Time: 11/16/2023 1:23 PM  Performed by: Cheral Bay, CRNAPre-anesthesia Checklist: Patient identified, Emergency Drugs available, Suction available and Patient being monitored Patient Re-evaluated:Patient Re-evaluated prior to induction Oxygen Delivery Method: Circle system utilized Preoxygenation: Pre-oxygenation with 100% oxygen Induction Type: IV induction Ventilation: Mask ventilation without difficulty Laryngoscope Size: McGrath and 4 Grade View: Grade I Tube type: Oral Tube size: 7.0 mm Number of attempts: 1 Airway Equipment and Method: Stylet and Oral airway Placement Confirmation: ETT inserted through vocal cords under direct vision, positive ETCO2 and breath sounds checked- equal and bilateral Tube secured with: Tape Dental Injury: Teeth and Oropharynx as per pre-operative assessment

## 2023-11-16 NOTE — Transfer of Care (Signed)
 Immediate Anesthesia Transfer of Care Note  Patient: Albert Tran  Procedure(s) Performed: CYSTOSCOPY/URETEROSCOPY/HOLMIUM LASER (Left: Ureter) CYSTOSCOPY, WITH BLADDER HYDRODISTENSION AND BIOPSY  Patient Location: PACU  Anesthesia Type:General  Level of Consciousness: drowsy  Airway & Oxygen Therapy: Patient Spontanous Breathing and Patient connected to face mask oxygen  Post-op Assessment: Report given to RN and Post -op Vital signs reviewed and stable  Post vital signs: Reviewed and stable  Last Vitals:  Vitals Value Taken Time  BP 135/75 11/16/23 1409  Temp 36 C 11/16/23 1409  Pulse 52 11/16/23 1411  Resp 12 11/16/23 1411  SpO2 99 % 11/16/23 1411  Vitals shown include unfiled device data.  Last Pain:  Vitals:   11/16/23 1010  TempSrc: Temporal  PainSc: 0-No pain         Complications: No notable events documented.

## 2023-11-19 ENCOUNTER — Encounter: Payer: Self-pay | Admitting: Urology

## 2023-11-19 LAB — SURGICAL PATHOLOGY

## 2023-11-20 NOTE — Telephone Encounter (Signed)
 Appointment scheduled.

## 2023-11-21 ENCOUNTER — Other Ambulatory Visit: Payer: Self-pay | Admitting: Family Medicine

## 2023-12-03 NOTE — Anesthesia Postprocedure Evaluation (Signed)
 Anesthesia Post Note  Patient: Albert Tran  Procedure(s) Performed: CYSTOSCOPY/URETEROSCOPY/HOLMIUM LASER/FULGERATION AND BLADDER TUMOR RESECTION (Left: Ureter) CYSTOSCOPY, WITH RETROGRADE PYELOGRAM  Patient location during evaluation: PACU Anesthesia Type: General Level of consciousness: awake and alert Pain management: pain level controlled Vital Signs Assessment: post-procedure vital signs reviewed and stable Respiratory status: spontaneous breathing, nonlabored ventilation, respiratory function stable and patient connected to nasal cannula oxygen Cardiovascular status: blood pressure returned to baseline and stable Postop Assessment: no apparent nausea or vomiting Anesthetic complications: no   No notable events documented.   Last Vitals:  Vitals:   11/16/23 1427 11/16/23 1442  BP: 130/80 (!) 158/90  Pulse: (!) 51 (!) 52  Resp: (!) 24 18  Temp: (!) 36.1 C   SpO2: 95% 98%    Last Pain:  Vitals:   11/16/23 1442  TempSrc:   PainSc: 0-No pain                 Lenard Simmer

## 2024-02-17 ENCOUNTER — Other Ambulatory Visit: Payer: Self-pay | Admitting: Family Medicine

## 2024-02-19 NOTE — Telephone Encounter (Signed)
 Last OV was PCP was a year ago. Pt is due for CPE (labs prior) or at least a f/u to keep getting meds filled. Please schedule and then route back to me to refill. Thanks

## 2024-02-19 NOTE — Telephone Encounter (Signed)
 Called  pt and schedule a appt for cpe

## 2024-02-20 NOTE — Telephone Encounter (Signed)
 Pt sch appt with Betty Bruckner on 03/27/24

## 2024-02-21 ENCOUNTER — Ambulatory Visit: Admitting: Urology

## 2024-02-21 ENCOUNTER — Encounter: Payer: Self-pay | Admitting: Urology

## 2024-02-21 VITALS — BP 180/107 | HR 62 | Ht 65.0 in | Wt 190.0 lb

## 2024-02-21 DIAGNOSIS — Z8551 Personal history of malignant neoplasm of bladder: Secondary | ICD-10-CM

## 2024-02-21 MED ORDER — LIDOCAINE HCL URETHRAL/MUCOSAL 2 % EX GEL
1.0000 | Freq: Once | CUTANEOUS | Status: AC
  Administered 2024-02-21: 1 via URETHRAL

## 2024-02-21 MED ORDER — CEPHALEXIN 250 MG PO CAPS: 500.0000 mg | ORAL_CAPSULE | Freq: Once | ORAL | Status: DC

## 2024-02-21 MED ORDER — CEPHALEXIN 500 MG PO CAPS: 500.0000 mg | ORAL_CAPSULE | Freq: Once | ORAL | 0 refills | Status: AC

## 2024-02-21 NOTE — Addendum Note (Signed)
 Addended by: Karyl Paget on: 02/21/2024 09:47 AM   Modules accepted: Orders

## 2024-02-21 NOTE — Progress Notes (Signed)
 Bladder cancer surveillance note  INDICATION History of low risk bladder cancer  UROLOGIC HISTORY  Albert Tran is a 58 y.o. male who underwent left ureteroscopy for a ureteral stone in March 2025, incidental finding of a 1.5 cm papillary bladder tumor left lateral wall, pathology LG Ta.  Initial Diagnosis of Bladder  Year: 11/2023 Pathology: LG Ta  Recurrent Bladder Cancer Diagnosis None  Treatments for Bladder Cancer 11/16/23: Bladder biopsy and fulguration 1.5 cm LG Ta, no gemcitabine given as incidental finding at time of ureteroscopy for stone  AUA Risk Category Low  Cystoscopy Procedure Note:  Keflex  given for prophylaxis  After informed consent and discussion of the procedure and its risks, [name] was positioned and prepped in the standard fashion. Cystoscopy was performed with the a flexible cystoscope. The urethra, bladder neck and entire bladder was visualized in a standard fashion, and bladder mucosa was grossly normal throughout. The ureteral orifices were visualized in their normal location and orientation.  No abnormalities on retroflexion.  RTC 9 months cystoscopy, yearly x 5 years  [name], MD 02/21/2024

## 2024-03-17 ENCOUNTER — Telehealth: Payer: Self-pay | Admitting: Family Medicine

## 2024-03-17 DIAGNOSIS — E7800 Pure hypercholesterolemia, unspecified: Secondary | ICD-10-CM

## 2024-03-17 DIAGNOSIS — I10 Essential (primary) hypertension: Secondary | ICD-10-CM

## 2024-03-17 DIAGNOSIS — R7303 Prediabetes: Secondary | ICD-10-CM

## 2024-03-17 NOTE — Telephone Encounter (Signed)
-----   Message from Veva JINNY Ferrari sent at 03/03/2024  3:21 PM EDT ----- Regarding: Lab orders for  [facility], 7.17.25 Patient is scheduled for CPX labs, please order future labs, Thanks , Veva

## 2024-03-20 ENCOUNTER — Ambulatory Visit: Payer: Self-pay | Admitting: Family Medicine

## 2024-03-20 ENCOUNTER — Other Ambulatory Visit (INDEPENDENT_AMBULATORY_CARE_PROVIDER_SITE_OTHER)

## 2024-03-20 DIAGNOSIS — I10 Essential (primary) hypertension: Secondary | ICD-10-CM

## 2024-03-20 DIAGNOSIS — R7303 Prediabetes: Secondary | ICD-10-CM

## 2024-03-20 DIAGNOSIS — E7800 Pure hypercholesterolemia, unspecified: Secondary | ICD-10-CM

## 2024-03-20 LAB — CBC WITH DIFFERENTIAL/PLATELET
Basophils Absolute: 0.1 K/uL (ref 0.0–0.1)
Basophils Relative: 0.8 % (ref 0.0–3.0)
Eosinophils Absolute: 0.3 K/uL (ref 0.0–0.7)
Eosinophils Relative: 4.3 % (ref 0.0–5.0)
HCT: 40.1 % (ref 39.0–52.0)
Hemoglobin: 13.5 g/dL (ref 13.0–17.0)
Lymphocytes Relative: 28.1 % (ref 12.0–46.0)
Lymphs Abs: 1.8 K/uL (ref 0.7–4.0)
MCHC: 33.7 g/dL (ref 30.0–36.0)
MCV: 88.1 fl (ref 78.0–100.0)
Monocytes Absolute: 0.6 K/uL (ref 0.1–1.0)
Monocytes Relative: 10 % (ref 3.0–12.0)
Neutro Abs: 3.6 K/uL (ref 1.4–7.7)
Neutrophils Relative %: 56.8 % (ref 43.0–77.0)
Platelets: 272 K/uL (ref 150.0–400.0)
RBC: 4.55 Mil/uL (ref 4.22–5.81)
RDW: 12.5 % (ref 11.5–15.5)
WBC: 6.3 K/uL (ref 4.0–10.5)

## 2024-03-20 LAB — COMPREHENSIVE METABOLIC PANEL WITH GFR
ALT: 17 U/L (ref 0–53)
AST: 19 U/L (ref 0–37)
Albumin: 4.3 g/dL (ref 3.5–5.2)
Alkaline Phosphatase: 47 U/L (ref 39–117)
BUN: 15 mg/dL (ref 6–23)
CO2: 29 meq/L (ref 19–32)
Calcium: 9.3 mg/dL (ref 8.4–10.5)
Chloride: 104 meq/L (ref 96–112)
Creatinine, Ser: 0.84 mg/dL (ref 0.40–1.50)
GFR: 97.1 mL/min (ref >60.00)
Glucose, Bld: 86 mg/dL (ref 70–99)
Potassium: 4.2 meq/L (ref 3.5–5.1)
Sodium: 141 meq/L (ref 135–145)
Total Bilirubin: 1 mg/dL (ref 0.2–1.2)
Total Protein: 6.6 g/dL (ref 6.0–8.3)

## 2024-03-20 LAB — LIPID PANEL
Cholesterol: 229 mg/dL — ABNORMAL HIGH (ref 0–200)
HDL: 48.7 mg/dL (ref >39.00)
LDL Cholesterol: 146 mg/dL — ABNORMAL HIGH (ref 0–99)
NonHDL: 180.4
Total CHOL/HDL Ratio: 5
Triglycerides: 170 mg/dL — ABNORMAL HIGH (ref 0.0–149.0)
VLDL: 34 mg/dL (ref 0.0–40.0)

## 2024-03-20 LAB — TSH: TSH: 3.36 u[IU]/mL (ref 0.35–5.50)

## 2024-03-20 LAB — HEMOGLOBIN A1C: Hgb A1c MFr Bld: 6.2 % (ref 4.6–6.5)

## 2024-03-27 ENCOUNTER — Ambulatory Visit (INDEPENDENT_AMBULATORY_CARE_PROVIDER_SITE_OTHER): Admitting: Family Medicine

## 2024-03-27 VITALS — BP 134/78 | HR 86 | Temp 98.2°F | Ht 65.0 in | Wt 203.0 lb

## 2024-03-27 DIAGNOSIS — E6609 Other obesity due to excess calories: Secondary | ICD-10-CM

## 2024-03-27 DIAGNOSIS — E66811 Obesity, class 1: Secondary | ICD-10-CM | POA: Diagnosis not present

## 2024-03-27 DIAGNOSIS — I10 Essential (primary) hypertension: Secondary | ICD-10-CM

## 2024-03-27 DIAGNOSIS — R7303 Prediabetes: Secondary | ICD-10-CM | POA: Diagnosis not present

## 2024-03-27 DIAGNOSIS — Z23 Encounter for immunization: Secondary | ICD-10-CM

## 2024-03-27 DIAGNOSIS — Z0000 Encounter for general adult medical examination without abnormal findings: Secondary | ICD-10-CM | POA: Diagnosis not present

## 2024-03-27 DIAGNOSIS — E7800 Pure hypercholesterolemia, unspecified: Secondary | ICD-10-CM

## 2024-03-27 DIAGNOSIS — Z6833 Body mass index (BMI) 33.0-33.9, adult: Secondary | ICD-10-CM

## 2024-03-27 DIAGNOSIS — Z8249 Family history of ischemic heart disease and other diseases of the circulatory system: Secondary | ICD-10-CM

## 2024-03-27 DIAGNOSIS — Z125 Encounter for screening for malignant neoplasm of prostate: Secondary | ICD-10-CM

## 2024-03-27 DIAGNOSIS — C679 Malignant neoplasm of bladder, unspecified: Secondary | ICD-10-CM | POA: Insufficient documentation

## 2024-03-27 DIAGNOSIS — B353 Tinea pedis: Secondary | ICD-10-CM | POA: Insufficient documentation

## 2024-03-27 MED ORDER — ROSUVASTATIN CALCIUM 10 MG PO TABS: 10.0000 mg | ORAL_TABLET | Freq: Every day | ORAL | 3 refills | Status: AC

## 2024-03-27 NOTE — Progress Notes (Signed)
 Subjective:    Patient ID: Albert Tran, male    DOB: 05/09/1966, 58 y.o.   MRN: [id number]  HPI  Here for health maintenance exam and to review chronic medical problems   Wt Readings from Last 3 Encounters:  03/27/24 203 lb (92.1 kg)  02/21/24 190 lb (86.2 kg)  11/16/23 190 lb (86.2 kg)   33.78 kg/m  Vitals:   03/27/24 0951  BP: 134/78  Pulse: 86  Temp: 98.2 F (36.8 C)  SpO2: 98%    Immunization History  Administered Date(s) Administered   PFIZER(Purple Top)SARS-COV-2 Vaccination 03/24/2020, 04/14/2020   Td 06/09/2010, 03/27/2024    Health Maintenance Due  Topic Date Due   Hepatitis C Screening  Never done   Hepatitis B Vaccines (1 of 3 - 19+ 3-dose series) Never done   Zoster Vaccines- Shingrix (1 of 2) Never done   COVID-19 Vaccine (3 - Pfizer risk series) 05/12/2020    Td was 06/2010   Shingrix vaccine -may be interested     Prostate health Lab Results  Component Value Date   PSA 0.23 01/15/2023   Sees urology for kidney stones and low risk bladder cancer  Cystoscopy yearly for bladder  Both uncle and dad had it  No problems or change in urination     Colon cancer screening -colonoscopy 07/2020  10 y recall  Has had surgery for diverticulitis  Internal hemorrhoids     Bone health   Falls-none Fractures-none  Supplements - mvi , fish oil     Exercise :  Archery - 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)  Regular activity     Mood    03/27/2024    9:56 AM 01/15/2023    9:50 AM 01/10/2021    3:57 PM 05/08/2017    3:19 PM  Depression screen PHQ 2/9  Decreased Interest 0 0 0 0  Down, Depressed, Hopeless 0 0 0 1  PHQ - 2 Score 0 0 0 1  Altered sleeping 0 0 0 0  Tired, decreased energy 0 0 1 0  Change in appetite 0 0 0 0  Feeling bad or failure about yourself  0 0 0 0  Trouble concentrating 0 0 0 0  Moving slowly or fidgety/restless 0 0 0 0  Suicidal thoughts 0 0 0 0  PHQ-9 Score 0 0 1 1  Difficult doing work/chores Not difficult at all Not  difficult at all Not difficult at all    HTN bp is stable today  No cp or palpitations or headaches or edema  No side effects to medicines  BP Readings from Last 3 Encounters:  03/27/24 134/78  02/21/24 (!) 180/107  11/16/23 (!) 158/90    Lisiniopril hct 20-25 mg daily  Amlodipine  5 mg prn for systolic over 150   Lab Results  Component Value Date   NA 141 03/20/2024   K 4.2 03/20/2024   CO2 29 03/20/2024   GLUCOSE 86 03/20/2024   BUN 15 03/20/2024   CREATININE 0.84 03/20/2024   CALCIUM  9.3 03/20/2024   GFR 97.10 03/20/2024   GFRNONAA >60 09/02/2023   History of fatty liver Lab Results  Component Value Date   ALT 17 03/20/2024   AST 19 03/20/2024   ALKPHOS 47 03/20/2024   BILITOT 1.0 03/20/2024    Hyperlipidemia Lab Results  Component Value Date   CHOL 229 (H) 03/20/2024   CHOL 155 01/08/2023   CHOL 206 (H) 01/13/2021   Lab Results  Component Value Date   HDL 48.70  03/20/2024   HDL 44.60 01/08/2023   HDL 41.40 01/13/2021   Lab Results  Component Value Date   LDLCALC 146 (H) 03/20/2024   LDLCALC 94 01/08/2023   LDLCALC 130 (H) 01/13/2021   Lab Results  Component Value Date   TRIG 170.0 (H) 03/20/2024   TRIG 85.0 01/08/2023   TRIG 172.0 (H) 01/13/2021   Lab Results  Component Value Date   CHOLHDL 5 03/20/2024   CHOLHDL 3 01/08/2023   CHOLHDL 5 01/13/2021   Lab Results  Component Value Date   LDLDIRECT 128.0 05/08/2017   LDLDIRECT 103.0 12/24/2015   LDLDIRECT 135.0 09/14/2015   Past- took crestor  10 mg -just ran out   Is interested in coronary ca score    Strong family history of CAD   Prediabetes Lab Results  Component Value Date   HGBA1C 6.2 03/20/2024   HGBA1C 5.9 01/15/2023     Patient Active Problem List   Diagnosis Date Noted   Athlete's foot 03/27/2024   Bladder cancer (HCC) 03/27/2024   Routine general medical examination at a health care facility 01/15/2023   Prostate cancer screening 01/15/2023   Prediabetes 01/15/2023    Aortic atherosclerosis (HCC) 04/18/2022   Calculus of gallbladder without cholecystitis without obstruction 04/18/2022   S/P laparoscopic-assisted sigmoidectomy 01/06/2020   Family history of early CAD 09/12/2016   Disorder of patella 11/15/2015   Stress reaction 09/14/2015   Essential hypertension 08/09/2015   Obesity 08/09/2015   Snoring 03/22/2011   Fatty liver 06/29/2010   Cholelithiasis 06/14/2010   Hyperlipidemia 06/09/2010   Plantar fasciitis, left 04/05/2010   CARPAL TUNNEL SYNDROME 10/11/2007   Past Medical History:  Diagnosis Date   Aortic atherosclerosis (HCC) 04/18/2022   Bladder cancer (HCC)    Carpal tunnel syndrome    Complication of anesthesia    a.) prolonged emergence; b.) family Hx of MH   DDD (degenerative disc disease)    Family history of adverse reaction to anesthesia    a.) malignant hyperthermia in 2nd degree relative (nephew)   Fatty liver    Gallstones    Headache(784.0)    likely migraines   Hyperlipidemia 06/09/2010   Hypertension    Hypertriglyceridemia    Nosebleed    Open fracture of ankle 06/09/2010   Plantar fasciitis    Vertigo, intermittent    Past Surgical History:  Procedure Laterality Date   COLONOSCOPY WITH PROPOFOL  N/A 07/13/2020   Procedure: COLONOSCOPY WITH PROPOFOL ;  Surgeon: Jinny Carmine, MD;  Location: [facility];  Service: Endoscopy;  Laterality: N/A;   CYSTOSCOPY W/ RETROGRADES  11/16/2023   Procedure: CYSTOSCOPY, WITH RETROGRADE PYELOGRAM;  Surgeon: Francisca Redell BROCKS, MD;  Location: [facility];  Service: Urology;;   CYSTOSCOPY/URETEROSCOPY/HOLMIUM LASER/STENT PLACEMENT Left 11/16/2023   Procedure: CYSTOSCOPY/URETEROSCOPY/HOLMIUM LASER/FULGERATION AND BLADDER TUMOR RESECTION;  Surgeon: Francisca Redell BROCKS, MD;  Location: [facility];  Service: Urology;  Laterality: Left;   EXTRACORPOREAL SHOCK WAVE LITHOTRIPSY Left 10/04/2023   Procedure: EXTRACORPOREAL SHOCK WAVE LITHOTRIPSY (ESWL);  Surgeon: Twylla Glendia BROCKS, MD;  Location:  [facility];  Service: Urology;  Laterality: Left;   HERNIA REPAIR     as a teen   KNEE ARTHROSCOPY     LAMINECTOMY     LS disc sx   robot assisted laprascopy with colectomy   01/23/2019   sigmoid colectomy from diverticulitis   TONSILLECTOMY     Social History   Tobacco Use   Smoking status: Former   Smokeless tobacco: Former    Types: Engineer, drilling  Vaping status: Never Used  Substance Use Topics   Alcohol use: No    Alcohol/week: 0.0 standard drinks of alcohol   Drug use: No   Family History  Problem Relation Age of Onset   Heart disease Mother        CAD   Alcohol abuse Father    Diabetes Father    Heart disease Father        CAD   Nephrolithiasis Father    Obesity Brother    Heart attack Brother 51   Cancer Paternal Uncle        bladder CA   Heart disease Sister 68       CAD with 4 stents   Allergies  Allergen Reactions   Pollen Extract    Current Outpatient Medications on File Prior to Visit  Medication Sig Dispense Refill   amLODipine  (NORVASC ) 5 MG tablet Take 5 mg by mouth daily as needed (bp over 150/90).     lisinopril -hydrochlorothiazide  (ZESTORETIC ) 20-25 MG tablet TAKE 1 TABLET BY MOUTH DAILY 90 tablet 0   Multiple Vitamin (MULTI-VITAMIN) tablet Take by mouth.     Omega-3 Fatty Acids (FISH OIL  PO) Take 1 capsule by mouth daily.     No current facility-administered medications on file prior to visit.    Review of Systems  Constitutional:  Negative for activity change, appetite change, fatigue, fever and unexpected weight change.  HENT:  Negative for congestion, rhinorrhea, sore throat and trouble swallowing.   Eyes:  Negative for pain, redness, itching and visual disturbance.  Respiratory:  Negative for cough, chest tightness, shortness of breath and wheezing.   Cardiovascular:  Negative for chest pain and palpitations.  Gastrointestinal:  Negative for abdominal pain, blood in stool, constipation, diarrhea and nausea.       Small  stools Occational loose  Since last colonoscopy  No blood   Endocrine: Negative for cold intolerance, heat intolerance, polydipsia and polyuria.  Genitourinary:  Negative for difficulty urinating, dysuria, frequency and urgency.  Musculoskeletal:  Negative for arthralgias, joint swelling and myalgias.  Skin:  Negative for pallor and rash.       Peeling skin/cracking on foot   Neurological:  Negative for dizziness, tremors, weakness, numbness and headaches.  Hematological:  Negative for adenopathy. Does not bruise/bleed easily.  Psychiatric/Behavioral:  Negative for decreased concentration and dysphoric mood. The patient is not nervous/anxious.        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    Appearance: Normal appearance. He is well-developed. He is obese. He is not ill-appearing or diaphoretic.  HENT:     Head: Normocephalic and atraumatic.     Right Ear: Tympanic membrane, ear canal and external ear normal.     Left Ear: Tympanic membrane, ear canal and external ear normal.     Nose: Nose normal. No congestion.     Mouth/Throat:     Mouth: Mucous membranes are moist.     Pharynx: Oropharynx is clear. No posterior oropharyngeal erythema.  Eyes:     General: No scleral icterus.       Right eye: No discharge.        Left eye: No discharge.     Conjunctiva/sclera: Conjunctivae normal.     Pupils: Pupils are equal, round, and reactive to light.  Neck:     Thyroid : No thyromegaly.     Vascular: No  carotid bruit or JVD.  Cardiovascular:     Rate and Rhythm: Normal rate and regular rhythm.     Pulses: Normal pulses.     Heart sounds: Normal heart sounds.     No gallop.  Pulmonary:     Effort: Pulmonary effort is normal. No respiratory distress.     Breath sounds: Normal breath sounds. No wheezing or rales.     Comments: Good air exch Chest:     Chest wall: No tenderness.  Abdominal:     General: Bowel  sounds are normal. There is no distension or abdominal bruit.     Palpations: Abdomen is soft. There is no mass.     Tenderness: There is no abdominal tenderness.     Hernia: No hernia is present.  Musculoskeletal:        General: No tenderness.     Cervical back: Normal range of motion and neck supple. No rigidity. No muscular tenderness.     Right lower leg: No edema.     Left lower leg: No edema.  Lymphadenopathy:     Cervical: No cervical adenopathy.  Skin:    General: Skin is warm and dry.     Coloration: Skin is not pale.     Findings: No erythema or rash.     Comments: Cracking /peeling of skin on right foot  Some maceration between toes  Less so on left foot   Solar lentigines diffusely   Neurological:     Mental Status: He is alert.     Cranial Nerves: No cranial nerve deficit.     Motor: No abnormal muscle tone.     Coordination: Coordination normal.     Gait: Gait normal.     Deep Tendon Reflexes: Reflexes are normal and symmetric. Reflexes normal.  Psychiatric:        Mood and Affect: Mood normal.        Cognition and Memory: Cognition and memory normal.           Assessment & Plan:   Problem List Items Addressed This Visit       Cardiovascular and Mediastinum   Essential hypertension   bp in fair control at this time  BP Readings from Last 1 Encounters:  03/27/24 134/78   No changes needed Most recent labs reviewed  Disc lifstyle change with low sodium diet and exercise    Lisinopril  hct 20-25 mg daily  Has amlodipine  5 mg prn for systolic over 150   High stress with work-he thinks this makes bp higher at times Encouraged to start checking at home when relaxed       Relevant Medications   rosuvastatin  (CRESTOR ) 10 MG tablet     Musculoskeletal and Integument   Athlete's foot   Worse on right Encouraged use of anti fungal cream/spray over the counter Treat shoes Keep feet dry as much as possible  Call back and Er precautions noted in  detail today          Genitourinary   Bladder cancer (HCC)   Low risk Found incidentally with renal stone work up from urology  Monitored with yearly cystoscopy         Other   Routine general medical examination at a health care facility - Primary   Reviewed health habits including diet and exercise and skin cancer prevention Reviewed appropriate screening tests for age  Also reviewed health mt list, fam hx and immunization status , as well as social and family history  See HPI Labs reviewed and ordered Health Maintenance  Topic Date Due   Hepatitis C Screening  Never done   Hepatitis B Vaccine (1 of 3 - 19+ 3-dose series) Never done   Zoster (Shingles) Vaccine (1 of 2) Never done   COVID-19 Vaccine (3 - Pfizer risk series) 05/12/2020   Flu Shot  04/04/2024   Colon Cancer Screening  07/13/2030   DTaP/Tdap/Td vaccine (3 - Tdap) 03/27/2034   HIV Screening  Completed   HPV Vaccine  Aged Out   Meningitis B Vaccine  Aged Out    Seeing urology  Encouraged to check on coverage for shingrix vaccine at pharmacy Discussed fall prevention, supplements and exercise for bone density  No falls or fractures PHQ 0 Td given today      Prostate cancer screening   Under urology care currently       Prediabetes   Lab Results  Component Value Date   HGBA1C 6.2 03/20/2024   HGBA1C 5.9 01/15/2023   disc imp of low glycemic diet and wt loss to prevent DM2       Obesity   Discussed how this problem influences overall health and the risks it imposes  Reviewed plan for weight loss with lower calorie diet (via better food choices (lower glycemic and portion control) along with exercise building up to or more than 30 minutes 5 days per week including some aerobic activity and strength training         Hyperlipidemia   Disc goals for lipids and reasons to control them Rev last labs with pt Rev low sat fat diet in detail  LDL is up to 146 off of crestor  10  Plan to start back  on it  Re check lab in 6 wk       Relevant Medications   rosuvastatin  (CRESTOR ) 10 MG tablet   Other Relevant Orders   CT CARDIAC SCORING (SELF PAY ONLY)   AST   ALT   Lipid panel   Family history of early CAD   Interested in screening No symptoms Referred for cardiac ca score       Relevant Orders   CT CARDIAC SCORING (SELF PAY ONLY)

## 2024-03-27 NOTE — Assessment & Plan Note (Signed)
 Interested in screening No symptoms Referred for cardiac ca score

## 2024-03-27 NOTE — Assessment & Plan Note (Signed)
 Lab Results  Component Value Date   HGBA1C 6.2 03/20/2024   HGBA1C 5.9 01/15/2023   disc imp of low glycemic diet and wt loss to prevent DM2

## 2024-03-27 NOTE — Assessment & Plan Note (Signed)
 Under urology care currently

## 2024-03-27 NOTE — Assessment & Plan Note (Signed)
 Disc goals for lipids and reasons to control them Rev last labs with pt Rev low sat fat diet in detail  LDL is up to 146 off of crestor  10  Plan to start back on it  Re check lab in 6 wk

## 2024-03-27 NOTE — Assessment & Plan Note (Signed)
 Discussed how this problem influences overall health and the risks it imposes  Reviewed plan for weight loss with lower calorie diet (via better food choices (lower glycemic and portion control) along with exercise building up to or more than 30 minutes 5 days per week including some aerobic activity and strength training

## 2024-03-27 NOTE — Assessment & Plan Note (Signed)
 Worse on right Encouraged use of anti fungal cream/spray over the counter Treat shoes Keep feet dry as much as possible  Call back and Er precautions noted in detail today

## 2024-03-27 NOTE — Assessment & Plan Note (Signed)
 bp in fair control at this time  BP Readings from Last 1 Encounters:  03/27/24 134/78   No changes needed Most recent labs reviewed  Disc lifstyle change with low sodium diet and exercise    Lisinopril  hct 20-25 mg daily  Has amlodipine  5 mg prn for systolic over 150   High stress with work-he thinks this makes bp higher at times Encouraged to start checking at home when relaxed

## 2024-03-27 NOTE — Assessment & Plan Note (Addendum)
 Reviewed health habits including diet and exercise and skin cancer prevention Reviewed appropriate screening tests for age  Also reviewed health mt list, fam hx and immunization status , as well as social and family history   See HPI Labs reviewed and ordered Health Maintenance  Topic Date Due   Hepatitis C Screening  Never done   Hepatitis B Vaccine (1 of 3 - 19+ 3-dose series) Never done   Zoster (Shingles) Vaccine (1 of 2) Never done   COVID-19 Vaccine (3 - Pfizer risk series) 05/12/2020   Flu Shot  04/04/2024   Colon Cancer Screening  07/13/2030   DTaP/Tdap/Td vaccine (3 - Tdap) 03/27/2034   HIV Screening  Completed   HPV Vaccine  Aged Out   Meningitis B Vaccine  Aged Out    Seeing urology  Encouraged to check on coverage for shingrix vaccine at pharmacy Discussed fall prevention, supplements and exercise for bone density  No falls or fractures PHQ 0 Td given today

## 2024-03-27 NOTE — Assessment & Plan Note (Signed)
 Low risk Found incidentally with renal stone work up from urology  Monitored with yearly cystoscopy

## 2024-03-27 NOTE — Patient Instructions (Addendum)
 If you are interested in the shingles vaccine series (Shingrix), call your insurance or pharmacy to check on coverage and location it must be given.  If affordable - you can schedule it here or at your pharmacy depending on coverage   Td -tetanus shot today   Keep up the strength training exercise    I put the referral in for a cardiac calcium  score test  Please let us  know if you don't hear in 1-2 weeks to set that up  Get back on the generic crestor    We will re check cholesterol in about 6 weeks   To prevent diabetes Try to get most of your carbohydrates from produce (with the exception of white potatoes) and whole grains Eat less bread/pasta/rice/snack foods/cereals/sweets and other items from the middle of the grocery store (processed carbs)

## 2024-05-18 ENCOUNTER — Other Ambulatory Visit: Payer: Self-pay | Admitting: Family Medicine

## 2024-05-18 DIAGNOSIS — I10 Essential (primary) hypertension: Secondary | ICD-10-CM

## 2024-12-24 ENCOUNTER — Other Ambulatory Visit: Admitting: Urology
